# Patient Record
Sex: Female | Born: 1985
Health system: Southern US, Community
[De-identification: ages and names within clinical notes are randomized; demographics above are authoritative.]

## PROBLEM LIST (undated history)

## (undated) DIAGNOSIS — F988 Other specified behavioral and emotional disorders with onset usually occurring in childhood and adolescence: Secondary | ICD-10-CM

## (undated) DIAGNOSIS — F329 Major depressive disorder, single episode, unspecified: Secondary | ICD-10-CM

## (undated) DIAGNOSIS — R87629 Unspecified abnormal cytological findings in specimens from vagina: Secondary | ICD-10-CM

## (undated) DIAGNOSIS — N750 Cyst of Bartholin's gland: Secondary | ICD-10-CM

## (undated) DIAGNOSIS — F419 Anxiety disorder, unspecified: Secondary | ICD-10-CM

## (undated) DIAGNOSIS — F32A Depression, unspecified: Secondary | ICD-10-CM

## (undated) DIAGNOSIS — N879 Dysplasia of cervix uteri, unspecified: Secondary | ICD-10-CM

## (undated) HISTORY — DX: Depression, unspecified: F32.A

## (undated) HISTORY — DX: Major depressive disorder, single episode, unspecified: F32.9

## (undated) HISTORY — DX: Anxiety disorder, unspecified: F41.9

## (undated) HISTORY — DX: Cyst of Bartholin's gland: N75.0

## (undated) HISTORY — DX: Other specified behavioral and emotional disorders with onset usually occurring in childhood and adolescence: F98.8

## (undated) HISTORY — DX: Dysplasia of cervix uteri, unspecified: N87.9

---
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
## 2005-11-06 ENCOUNTER — Emergency Department (HOSPITAL_COMMUNITY): Admission: EM | Admit: 2005-11-06 | Discharge: 2005-11-06 | Payer: Self-pay | Admitting: Emergency Medicine

## 2006-03-25 ENCOUNTER — Inpatient Hospital Stay (HOSPITAL_COMMUNITY): Admission: AD | Admit: 2006-03-25 | Discharge: 2006-03-28 | Payer: Self-pay | Admitting: Obstetrics and Gynecology

## 2007-11-28 ENCOUNTER — Emergency Department (HOSPITAL_COMMUNITY): Admission: EM | Admit: 2007-11-28 | Discharge: 2007-11-28 | Payer: Self-pay | Admitting: Family Medicine

## 2009-03-12 ENCOUNTER — Emergency Department (HOSPITAL_COMMUNITY): Admission: EM | Admit: 2009-03-12 | Discharge: 2009-03-12 | Payer: Self-pay | Admitting: Emergency Medicine

## 2009-04-21 HISTORY — PX: CHOLECYSTECTOMY: SHX55

## 2009-06-07 ENCOUNTER — Ambulatory Visit (HOSPITAL_COMMUNITY): Admission: RE | Admit: 2009-06-07 | Discharge: 2009-06-07 | Payer: Self-pay | Admitting: Surgery

## 2010-04-21 HISTORY — PX: CERVICAL BIOPSY  W/ LOOP ELECTRODE EXCISION: SUR135

## 2010-07-10 LAB — CBC
HCT: 39.1 % (ref 36.0–46.0)
MCV: 90.4 fL (ref 78.0–100.0)
WBC: 7.6 10*3/uL (ref 4.0–10.5)

## 2010-07-10 LAB — DIFFERENTIAL
Basophils Absolute: 0 10*3/uL (ref 0.0–0.1)
Lymphs Abs: 2.6 10*3/uL (ref 0.7–4.0)
Monocytes Absolute: 0.4 10*3/uL (ref 0.1–1.0)
Monocytes Relative: 6 % (ref 3–12)
Neutro Abs: 4.5 10*3/uL (ref 1.7–7.7)

## 2010-07-10 LAB — COMPREHENSIVE METABOLIC PANEL
ALT: 16 U/L (ref 0–35)
AST: 19 U/L (ref 0–37)
Albumin: 4.2 g/dL (ref 3.5–5.2)
BUN: 13 mg/dL (ref 6–23)
CO2: 27 mEq/L (ref 19–32)
Calcium: 9.3 mg/dL (ref 8.4–10.5)
GFR calc Af Amer: >60 mL/min (ref >60)
GFR calc non Af Amer: >60 mL/min (ref >60)
Glucose, Bld: 96 mg/dL (ref 70–99)
Potassium: 4.1 mEq/L (ref 3.5–5.1)

## 2010-07-24 ENCOUNTER — Ambulatory Visit (INDEPENDENT_AMBULATORY_CARE_PROVIDER_SITE_OTHER): Payer: Medicaid Other | Admitting: Gynecology

## 2010-07-24 DIAGNOSIS — N871 Moderate cervical dysplasia: Secondary | ICD-10-CM

## 2010-07-24 DIAGNOSIS — R87810 Cervical high risk human papillomavirus (HPV) DNA test positive: Secondary | ICD-10-CM

## 2010-07-24 DIAGNOSIS — N750 Cyst of Bartholin's gland: Secondary | ICD-10-CM

## 2010-07-31 ENCOUNTER — Ambulatory Visit (INDEPENDENT_AMBULATORY_CARE_PROVIDER_SITE_OTHER): Payer: Medicaid Other | Admitting: Gynecology

## 2010-07-31 ENCOUNTER — Other Ambulatory Visit: Payer: Self-pay | Admitting: Gynecology

## 2010-07-31 DIAGNOSIS — N871 Moderate cervical dysplasia: Secondary | ICD-10-CM

## 2010-09-06 NOTE — Op Note (Signed)
NAME:  [name], [name] NO.:  [id number]   MEDICAL RECORD NO.:  [id number]          PATIENT TYPE:  INP   LOCATION:  9105                          FACILITY:  [facility]   PHYSICIAN:  Juluis Mire, M.D.   DATE OF BIRTH:  21-Mar-1986   DATE OF PROCEDURE:  03/25/2006  DATE OF DISCHARGE:                               OPERATIVE REPORT   PREOPERATIVE DIAGNOSIS:  Uterine pregnancy at term with prior cesarean  section, desires repeat.   POSTOPERATIVE DIAGNOSIS:  Uterine pregnancy at term with prior cesarean  section, desires repeat.   PROCEDURE:  Low transverse cesarean section.   SURGEON:  Juluis Mire, M.D.   ANESTHESIA:  Spinal.   ESTIMATED BLOOD LOSS:  400-500 mL.   PACKS AND DRAINS:  None.   INTRAOPERATIVE BLOOD PLACED:  None.   COMPLICATIONS:  None.   INDICATIONS:  Dictated history and physical.   DESCRIPTION OF PROCEDURE:  Patient was taken to the OR, placed in supine  position with left lateral tilt.  After satisfactory level of spinal  anesthesia was obtained, the abdomen was prepped out with Betadine and  draped as a sterile field.  A prior low transverse incision was  identified.  The prior low transverse incision was then excised.  The  incision was extended through the subcutaneous tissue.  The fascia was  entered sharply and incision in fascia extended laterally.  The fascia  taken off the muscle superiorly inferiorly.  Rectus muscles were  separated in the midline.  Peritoneum was entered sharply and incision  in perineum extended both superiorly and inferiorly.  A low transverse  bladder flap was developed.  A low transverse uterine incision begun  with a knife and extended laterally using manual retraction.  Amniotic  fluid was clear.  The infant was actually in the back down presentation,  we converted to a breech and it was delivered in the usual manner.  The  infant was a viable female, Apgars were 8/9.  PH and weight are pending.  Placenta was  then delivered manually.  Uterus was exteriorized for  closure.  Tubes and ovaries were unremarkable.  Uterus was then closed  with running locking suture of 0 chromic using two-layer closure  technique.  We had good hemostasis and clear urine output.  Uterus  returned to the abdominal cavity.  Muscles were reapproximated with  running suture of 3-0 Vicryl, fascia closed with running suture of 0  PDS.  Skin  was closed with staples and Steri-Strips.  Sponge, needle and instrument  counts were reported as correct by circulating nurse x2.  Foley catheter  remained clear at time of closure.  The patient tolerated suture and was  returned to the recovery room in good condition.      Juluis Mire, M.D.  Electronically Signed     JSM/MEDQ  D:  03/25/2006  T:  03/25/2006  Job:  [id number]

## 2010-09-06 NOTE — Op Note (Signed)
[name], [name]                 ACCOUNT NO.:  [id number]   MEDICAL RECORD NO.:  [id number]          PATIENT TYPE:  INP   LOCATION:  9198                          [facility]ACILITY:  [facility]   PHYSICIAN:  Dineen Kid. Rana Snare, M.D.    DATE OF BIRTH:  1985/06/01   DATE OF PROCEDURE:  04/01/2004  DATE OF DISCHARGE:                                 OPERATIVE REPORT   PREOPERATIVE DIAGNOSES:  1.  Intrauterine pregnancy at 40-1/2 weeks.  2.  Macrosomia.  3.  Borderline pelvis and unfavorable cervix.   POSTOPERATIVE DIAGNOSES:  1.  Intrauterine pregnancy at 40-1/2 weeks.  2.  Macrosomia.  3.  Borderline pelvis and unfavorable cervix.  4.  Left paratubal cyst.   PROCEDURE:  Primary low-segment transverse cesarean section and removal of  left paratubal cyst.   SURGEON:  Dineen Kid. Rana Snare, M.D.   ANESTHESIA:  Spinal.   INDICATIONS:  [name] is a 25 year old G1 at 40-1/2 weeks.  Ultrasound  shows 9 pounds, 12 ounces.  The cervix is unfavorable and the presenting  vertex is not in the pelvis.  Planned primary low-segment transverse  cesarean section for borderline pelvis and macrosomia and unfavorable  cervix.  Risks and benefits were discussed.  Informed consent was obtained.   FINDINGS AT THE TIME OF SURGERY:  A viable female infant, Apgars are 8 and 9.  pH  and weight are currently pending.  She did have two left paratubal  cysts.   DESCRIPTION OF PROCEDURE:  After adequate analgesia, the patient was placed  in the supine position, left lateral tilt.  She was sterilely prepped and  draped.  The bladder was sterilely drained with a Foley catheter.  A  Pfannenstiel skin incision was made two fingerbreadths above the pubic  symphysis, taken down sharply to the fascia which was incised transversely  and then extended superiorly, then inferiorly off the bellies of the rectus  muscle.  The rectus muscles were sharply separated and bladder flap was  created and placed behind the bladder blade.  A low segment   myotomy  incision was made down to the infant's vertex, extended laterally with the  operator's fingertips.  The infant's vertex was easily delivered with vacuum  extractor with one easy pull.  The nose and pharynx were suctioned.  Nuchal  cord x1 reduced.  The infant was then delivered, cord, clamped, cut and  handed to pediatricians for resuscitation.  The placenta was extracted  manually after cord blood was obtained.  The uterus was exteriorized, wiped  clean with a dry lap.  The myotomy incision was closed in two layers, the  first being a running locking layer, the second being an imbricating layer.  After hemostasis was achieved, the left paratubal cyst hanging off the end  of the fimbria was noted, sharply excised with Bovie cautery with good  hemostasis achieved and care taken to avoid the fimbriated portion of the  tube without much tubal destruction.  The uterus was placed back into the  peritoneal cavity and after a copious amount of irrigation, adequate  hemostasis was assured.  The peritoneum was closed with 0 Monocryl in a  running fashion.  The rectus muscle was plicated in the midline.  Irrigation  was applied and after adequate hemostasis, the fascia was closed with #1  Vicryl in running fashion.  Irrigation was applied after hemostasis.  Skin  was stapled, Steri-Strips were applied.  The patient tolerated the procedure  well, was stable and transferred to recovery room.  Sponge and instrument  count was normal x3.  Estimated blood loss was 800 mL.  The patient received  1 g of Rocephin after delivery of the placenta.     Davi   DCL/MEDQ  D:  04/01/2004  T:  04/01/2004  Job:  [id number]

## 2010-09-06 NOTE — H&P (Signed)
NAME:  [name], CALOCA NO.:  [id number]   MEDICAL RECORD NO.:  [id number]          PATIENT TYPE:  INP   LOCATION:  NA                            [facility]   PHYSICIAN:  Juluis Mire, M.D.   DATE OF BIRTH:  1985/11/23   DATE OF ADMISSION:  03/25/2006  DATE OF DISCHARGE:                              HISTORY & PHYSICAL   The patient is a 25 year old gravida 2, para 1 female, estimated date of  confinement of December 11 giving her an estimated gestational age of [redacted]  weeks.  She had a prior cesarean section for failure to progress.  Have  discussed trial of labor which she declined presenting for repeat  cesarean section.  No other risk factors noted.   No known drug allergies.   MEDICATIONS:  Include prenatal vitamins.   PRENATAL LABORATORY DATA:  She is A+ with a negative antibody screen,  nonreactive serology, is immune to rubella and her hepatitis surface  antigen was negative.  HIV was nonreactive.   For past medical history, family history and social history, please see  prenatal records.   REVIEW OF SYSTEMS:  Noncontributory.   PHYSICAL EXAMINATION:  The patient is afebrile with stable vital signs.  HEENT EXAM:  The patient is normocephalic.  Pupils equal, round, and  reactive to light and accommodation.  Extraocular movements were intact.  Sclerae and conjunctivae clear.  Oropharynx clear.  NECK:  Without thyromegaly.  BREAST:  Glandular but no discrete masses.  LUNGS:  Clear.  CARDIOVASCULAR SYSTEM:  Regular rhythm rate without murmurs or gallops.  Her abdominal exam confirms a gravid uterus consistent with dates.  Fetal heart tones are audible.  PELVIC:  Deferred due to planned cesarean section.  EXTREMITIES:  Trace edema.  NEUROLOGICAL EXAM:  Grossly normal within normal limits.  Deep tendon  reflexes were 2+, and there was no clonus.   IMPRESSION:  1. Intrauterine pregnancy at 39 weeks.  2. Prior cesarean section, desirous of  repeat.   PLAN:  The patient will undergo repeat cesarean section.  Risk of  cesarean section discussed including the risk of an infection.  The risk  of hemorrhage could require transfusion, risk of AIDS or hepatitis.  The  risk of injury to adjacent organs including bladder, bowel, or ureters  could require further exploratory surgery.  Risk of deep venous  thrombosis and pulmonary embolus.  The patient expressed understanding  of indications and risk.      Juluis Mire, M.D.  Electronically Signed     JSM/MEDQ  D:  03/25/2006  T:  03/25/2006  Job:  [id number]

## 2010-09-06 NOTE — Discharge Summary (Signed)
[name], LOZADA NO.:  [id number]   MEDICAL RECORD NO.:  [id number]          PATIENT TYPE:  INP   LOCATION:  9102                          [facility]   PHYSICIAN:  Freddy Finner, M.D.   DATE OF BIRTH:  1985-10-14   DATE OF ADMISSION:  04/01/2004  DATE OF DISCHARGE:  04/04/2004                                 DISCHARGE SUMMARY   ADMITTING DIAGNOSES:  1.  Intrauterine pregnancy at 40-and-a-half weeks estimated gestational age.  2.  Macrosomia.  3.  Borderline pelvis and unfavorable cervix.   DISCHARGE DIAGNOSES:  1.  Status post low transverse cesarean section.  2.  Viable female infant.   PROCEDURE:  Primary low transverse cesarean section.   REASON FOR ADMISSION:  Please see dictated H&P.   HOSPITAL COURSE:  The patient was an 25 year old primigravida that was  admitted to [facility] for a scheduled cesarean section at  40 and five-sevenths weeks estimated gestational age.  The patient had had  an ultrasound in the office with an estimated fetal weight of 9 pounds 12  ounces.  The patient had a very unfavorable presentation of a vertex and a  borderline pelvis.  Due to fetal macrosomia and borderline pelvis, decision  was made to proceed with a cesarean delivery.  On the morning of admission  the patient was taken to the operating room where spinal anesthesia was  administered without difficulty.  A low transverse incision was made with  the delivery of a viable female infant weighing 8 pounds 10 ounces with Apgars  of 8 at one minute and 9 at five minutes.  The patient tolerated the  procedure well and was taken to the recovery room in stable condition.  On  postoperative day #1, the patient was without complaint.  Vital signs were  stable.  Abdomen was soft with good return of bowel function.  Abdominal  dressing was noted to have a small amount of old drainage noted on bandage.  Fundus was firm and nontender.  Labs revealed  hemoglobin of 9.7; platelet  count 167,000; wbc count of 10.4.  On postoperative day #2, the patient was  without complaint.  Vital signs remained stable, she was afebrile.  Abdomen  was soft.  Fundus was firm and nontender.  Abdominal dressing had been  removed revealing an incision that was clean, dry, and intact.  The patient  was ambulating well and tolerating a regular diet without complaints of  nausea and vomiting.  On postoperative day #3, the patient was without  complaint.  Vital signs were stable.  Abdomen was soft, fundus was firm and  nontender. Incision was clean, dry, and intact.  Staples were removed and  the patient was discharged home.   CONDITION ON DISCHARGE:  Good.   DIET:  Regular as tolerated.   ACTIVITY:  No heavy lifting, no driving x2 weeks, no vaginal entry.   FOLLOW-UP:  The patient to follow up in the office in 1 week for an incision  check.  She is to call for temperature greater than 100 degrees,  persistent  nausea and vomiting, heavy vaginal bleeding, and/or redness or drainage from  the incisional site.   DISCHARGE MEDICATIONS:  1.  Tylox #30 one p.o. q.4-6h. p.r.n.  2.  Motrin 600 mg q.6h.  3.  Prenatal vitamins one p.o. daily.  4.  Colace one p.o. daily p.r.n.     Caro   CC/MEDQ  D:  04/26/2004  T:  04/26/2004  Job:  [id number]

## 2010-09-06 NOTE — H&P (Signed)
NAMEJAYLYNN, [name]                 ACCOUNT NO.:  [id number]   MEDICAL RECORD NO.:  [id number]          PATIENT TYPE:  INP   LOCATION:  NA                            [facility]   PHYSICIAN:  Dineen Kid. Rana Snare, M.D.    DATE OF BIRTH:  12/31/1985   DATE OF ADMISSION:  04/01/2004  DATE OF DISCHARGE:                                HISTORY & PHYSICAL   HISTORY OF PRESENT ILLNESS:  The patient is an 25 year old gravida 1, at 53-  5/[redacted] weeks gestational age with an emergency department of March 27, 2004.  She had an ultrasound on March 28, 2004, showing estimated fetal weight of  9 pounds 12 ounces.  She has a very unfavorable presentation of the fetal  vertex and a borderline pelvis.  She presents for primary low transverse  cesarean section due to fetal macrosomia and borderline pelvis.  Her  pregnancy has otherwise been uncomplicated other than abnormal Pap smear and  she is GBS negative.  She did have late prenatal care, presented at 25 weeks  and so first trimester screening was not performed.  Blood type is A  positive.   PAST MEDICAL HISTORY:  Negative.   PAST SURGICAL HISTORY:  Negative.   MEDICATIONS:  Prenatal vitamins.   ALLERGIES:  No known drug allergies.   PHYSICAL EXAMINATION:  VITAL SIGNS:  Blood pressure 100/80.  HEART:  Regular rate and rhythm.  LUNGS:  Clear to auscultation bilaterally.  ABDOMEN:  Gravida and nontender.  Fundal height is 40 cm.  PELVIC:  Cervix is 2, 50%, and fetal vertex very high out of the pelvis.   IMPRESSION:  Intrauterine pregnancy at 40-1/[redacted] weeks gestational age with  fetal macrosomia, unfavorable cervix and unfavorable presentation.   PLAN:  Primary low transverse cesarean section.  Risks and benefits were  discussed at length which include, but are not limited to risk of infection,  bleeding, damage to uterus, tubes, ovaries, bowel, bladder, and fetus.  She  does give her informed consent and wished to proceed.     Davi   DCL/MEDQ   D:  04/01/2004  T:  04/01/2004  Job:  [id number]

## 2010-09-06 NOTE — Discharge Summary (Signed)
NAMESHARYN, BRILLIANT NO.:  [id number]   MEDICAL RECORD NO.:  [id number]          PATIENT TYPE:  INP   LOCATION:  9105                          [facility]   PHYSICIAN:  Juluis Mire, M.D.   DATE OF BIRTH:  10-13-1985   DATE OF ADMISSION:  03/25/2006  DATE OF DISCHARGE:  03/28/2006                               DISCHARGE SUMMARY   ADMISSION DIAGNOSES:  1. Intrauterine pregnancy at term.  2. Previous cesarean section, desires repeat.   DISCHARGE DIAGNOSES:  1. Status post low transverse cesarean section.  2. A viable female infant.   PROCEDURE:  Repeat low transverse cesarean section.   REASON FOR ADMISSION:  Please see dictated H&P.   HOSPITAL COURSE:  The patient was a of 25 year old gravida 2, para 1  that was admitted to [facility] for scheduled cesarean  section.  The patient had had a previous cesarean section for failure to  progress.  The patient therefore desired a repeat cesarean section.  On  the morning of admission the patient was taken to the operating room  where spinal anesthesia was administered without difficulty.  A low  transverse incision was made with delivery of a viable female infant  weighing 7 pounds 12 ounces with Apgars of 9 at 1 minute and 9 at 5  minutes.  The patient tolerated the procedure well and was taken to the  recovery room in stable condition.   On postoperative day #1, the patient was without complaint.  Vital signs  were stable.  She was afebrile.  Abdomen soft.  Fundus firm and  nontender.  Abdominal dressing was noted to have a scant amount of old  drainage noted on the bandage.  Laboratory findings showed hemoglobin  9.3, platelet count of 122,000, wbc count of 8.1.   Postoperative day #2, the patient was without complaint.  Vital signs  were stable, she was afebrile.  Abdomen soft.  Fundus firm and  nontender.  Abdominal dressing had been removed revealing an incision  that is clean, dry and  intact.   On postoperative day #3, the patient was without complaint.  Vital signs  remained stable.  She is afebrile.  Fundus firm and nontender.  Incision  was clean, dry and intact.  Staples removed.  The patient was later  discharged home.   CONDITION ON DISCHARGE:  Stable.   DIET:  Regular as tolerated.   ACTIVITY:  No heavy lifting, no driving x2 weeks, no vaginal entry.   FOLLOW UP:  Patient to follow up in the office in 1 week for an incision  check.  She is to call for temperature greater than 100 degrees,  persistent nausea, vomiting, heavy vaginal bleeding and/or redness or  drainage from incisional site.   DISCHARGE MEDICATIONS:  1. Tylox #30 one p.o. q.4-6h. p.r.n.  2. Motrin 600 mg every 6 hours.  3. Prenatal vitamins one p.o. daily.  4. Colace one p.o. daily p.r.n.      Julio Sicks, N.P.      Juluis Mire, M.D.  Electronically Signed  CC/MEDQ  D:  04/28/2006  T:  04/28/2006  Job:  [id number]

## 2010-10-28 ENCOUNTER — Other Ambulatory Visit: Payer: Self-pay | Admitting: Gynecology

## 2010-10-28 ENCOUNTER — Other Ambulatory Visit (HOSPITAL_COMMUNITY)
Admission: RE | Admit: 2010-10-28 | Discharge: 2010-10-28 | Disposition: A | Discharge status: Home / Self Care | Payer: Medicaid Other | Source: Ambulatory Visit | Attending: Gynecology | Admitting: Gynecology

## 2010-10-28 ENCOUNTER — Ambulatory Visit (INDEPENDENT_AMBULATORY_CARE_PROVIDER_SITE_OTHER): Payer: Medicaid Other | Admitting: Gynecology

## 2010-10-28 DIAGNOSIS — R87619 Unspecified abnormal cytological findings in specimens from cervix uteri: Secondary | ICD-10-CM | POA: Insufficient documentation

## 2010-10-28 DIAGNOSIS — N750 Cyst of Bartholin's gland: Secondary | ICD-10-CM

## 2010-10-28 DIAGNOSIS — N841 Polyp of cervix uteri: Secondary | ICD-10-CM

## 2010-12-29 ENCOUNTER — Emergency Department (HOSPITAL_COMMUNITY)
Admission: EM | Admit: 2010-12-29 | Discharge: 2010-12-29 | Disposition: A | Discharge status: Home / Self Care | Payer: Medicaid Other | Attending: Emergency Medicine | Admitting: Emergency Medicine

## 2010-12-29 ENCOUNTER — Emergency Department (HOSPITAL_COMMUNITY): Payer: Medicaid Other

## 2010-12-29 DIAGNOSIS — M79609 Pain in unspecified limb: Secondary | ICD-10-CM | POA: Insufficient documentation

## 2010-12-29 DIAGNOSIS — Z8781 Personal history of (healed) traumatic fracture: Secondary | ICD-10-CM | POA: Insufficient documentation

## 2011-05-21 ENCOUNTER — Emergency Department (HOSPITAL_COMMUNITY): Payer: No Typology Code available for payment source

## 2011-05-21 ENCOUNTER — Encounter (HOSPITAL_COMMUNITY): Payer: Self-pay | Admitting: *Deleted

## 2011-05-21 ENCOUNTER — Emergency Department (HOSPITAL_COMMUNITY)
Admission: EM | Admit: 2011-05-21 | Discharge: 2011-05-21 | Disposition: A | Discharge status: Home / Self Care | Payer: No Typology Code available for payment source | Attending: Emergency Medicine | Admitting: Emergency Medicine

## 2011-05-21 DIAGNOSIS — M25519 Pain in unspecified shoulder: Secondary | ICD-10-CM | POA: Insufficient documentation

## 2011-05-21 DIAGNOSIS — M542 Cervicalgia: Secondary | ICD-10-CM | POA: Insufficient documentation

## 2011-05-21 DIAGNOSIS — R079 Chest pain, unspecified: Secondary | ICD-10-CM | POA: Insufficient documentation

## 2011-05-21 DIAGNOSIS — S42001A Fracture of unspecified part of right clavicle, initial encounter for closed fracture: Secondary | ICD-10-CM

## 2011-05-21 DIAGNOSIS — S42023A Displaced fracture of shaft of unspecified clavicle, initial encounter for closed fracture: Secondary | ICD-10-CM | POA: Insufficient documentation

## 2011-05-21 MED ORDER — MORPHINE SULFATE 4 MG/ML IJ SOLN
4.0000 mg | Freq: Once | INTRAMUSCULAR | Status: AC
  Administered 2011-05-21: 4 mg via INTRAVENOUS
  Filled 2011-05-21: qty 1

## 2011-05-21 MED ORDER — HYDROCODONE-ACETAMINOPHEN 5-500 MG PO TABS: 1.0000 | ORAL_TABLET | ORAL | Status: AC | PRN
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
## 2011-05-21 NOTE — ED Notes (Addendum)
Hourly rounding: Pt complaining of pain at this time. Pain is 10 out of 10. Discussed plan of care. Environment secured. Call light within reach. Bed locked. Will continue to monitor.
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
## 2011-05-21 NOTE — ED Notes (Signed)
Dr. Patria Mane at bedside discussing plan of care with pt.

## 2011-05-21 NOTE — ED Notes (Signed)
Introduced self to pt. Plan of care discussed. Pt reports that she has drank "a lot" of ETOH tonight.
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
## 2011-05-21 NOTE — ED Notes (Signed)
Family at beside. Family given emotional support. Introduced self to family.

## 2011-06-05 ENCOUNTER — Other Ambulatory Visit (HOSPITAL_COMMUNITY): Payer: Self-pay | Admitting: Orthopedic Surgery

## 2011-06-09 ENCOUNTER — Encounter (HOSPITAL_COMMUNITY): Payer: Self-pay | Admitting: Pharmacy Technician

## 2011-06-09 ENCOUNTER — Encounter (HOSPITAL_COMMUNITY): Payer: Self-pay | Admitting: *Deleted

## 2011-06-09 MED ORDER — CEFAZOLIN SODIUM-DEXTROSE 2-3 GM-% IV SOLR
2.0000 g | INTRAVENOUS | Status: DC
  Filled 2011-06-09: qty 50

## 2011-06-09 MED ORDER — CHLORHEXIDINE GLUCONATE 4 % EX LIQD
60.0000 mL | Freq: Once | CUTANEOUS | Status: DC
  Filled 2011-06-09: qty 60

## 2011-06-10 ENCOUNTER — Ambulatory Visit (HOSPITAL_COMMUNITY): Payer: Medicaid Other

## 2011-06-10 ENCOUNTER — Other Ambulatory Visit: Payer: Self-pay

## 2011-06-10 ENCOUNTER — Encounter (HOSPITAL_COMMUNITY): Payer: Self-pay | Admitting: Certified Registered"

## 2011-06-10 ENCOUNTER — Inpatient Hospital Stay (HOSPITAL_COMMUNITY): Payer: Medicaid Other

## 2011-06-10 ENCOUNTER — Encounter (HOSPITAL_COMMUNITY)
Admission: RE | Disposition: A | Discharge status: Home / Self Care | Payer: Self-pay | Source: Ambulatory Visit | Attending: Orthopedic Surgery

## 2011-06-10 ENCOUNTER — Ambulatory Visit (HOSPITAL_COMMUNITY): Payer: Medicaid Other | Admitting: Certified Registered"

## 2011-06-10 ENCOUNTER — Ambulatory Visit (HOSPITAL_COMMUNITY)
Admission: RE | Admit: 2011-06-10 | Discharge: 2011-06-11 | DRG: 517 | Disposition: A | Discharge status: Home / Self Care | Payer: Medicaid Other | Source: Ambulatory Visit | Attending: Orthopedic Surgery | Admitting: Orthopedic Surgery

## 2011-06-10 ENCOUNTER — Encounter (HOSPITAL_COMMUNITY): Payer: Self-pay | Admitting: Radiology

## 2011-06-10 DIAGNOSIS — S42023A Displaced fracture of shaft of unspecified clavicle, initial encounter for closed fracture: Secondary | ICD-10-CM | POA: Insufficient documentation

## 2011-06-10 DIAGNOSIS — S42009A Fracture of unspecified part of unspecified clavicle, initial encounter for closed fracture: Secondary | ICD-10-CM

## 2011-06-10 HISTORY — PX: ORIF CLAVICULAR FRACTURE: SHX5055

## 2011-06-10 LAB — CBC
HCT: 39.3 % (ref 36.0–46.0)
MCV: 89.1 fL (ref 78.0–100.0)
RBC: 4.41 MIL/uL (ref 3.87–5.11)
WBC: 7 10*3/uL (ref 4.0–10.5)

## 2011-06-10 LAB — HCG, SERUM, QUALITATIVE: Preg, Serum: NEGATIVE

## 2011-06-10 LAB — TYPE AND SCREEN

## 2011-06-10 LAB — BASIC METABOLIC PANEL
CO2: 26 mEq/L (ref 19–32)
Chloride: 103 mEq/L (ref 96–112)
Creatinine, Ser: 0.6 mg/dL (ref 0.50–1.10)
Sodium: 140 mEq/L (ref 135–145)

## 2011-06-10 LAB — SURGICAL PCR SCREEN: Staphylococcus aureus: NEGATIVE

## 2011-06-10 LAB — ABO/RH: ABO/RH(D): A POS

## 2011-06-10 SURGERY — OPEN REDUCTION INTERNAL FIXATION (ORIF) CLAVICULAR FRACTURE
Anesthesia: General | Site: Shoulder | Laterality: Right | Wound class: Clean

## 2011-06-10 MED ORDER — ONDANSETRON HCL 4 MG/2ML IJ SOLN: 4.0000 mg | Freq: Four times a day (QID) | INTRAMUSCULAR | Status: DC | PRN

## 2011-06-10 MED ORDER — KETOROLAC TROMETHAMINE 30 MG/ML IJ SOLN
INTRAMUSCULAR | Status: DC | PRN
  Administered 2011-06-10: 30 mg via INTRAVENOUS

## 2011-06-10 MED ORDER — FENTANYL CITRATE 0.05 MG/ML IJ SOLN
INTRAMUSCULAR | Status: DC | PRN
  Administered 2011-06-10: 50 ug via INTRAVENOUS
  Administered 2011-06-10: 100 ug via INTRAVENOUS
  Administered 2011-06-10 (×3): 50 ug via INTRAVENOUS

## 2011-06-10 MED ORDER — CEFAZOLIN SODIUM 1-5 GM-% IV SOLN
1.0000 g | Freq: Three times a day (TID) | INTRAVENOUS | Status: DC
  Administered 2011-06-10 – 2011-06-11 (×2): 1 g via INTRAVENOUS
  Filled 2011-06-10 (×3): qty 50

## 2011-06-10 MED ORDER — LACTATED RINGERS IV SOLN
INTRAVENOUS | Status: DC
  Administered 2011-06-10: 15:00:00 via INTRAVENOUS

## 2011-06-10 MED ORDER — NEOSTIGMINE METHYLSULFATE 1 MG/ML IJ SOLN
INTRAMUSCULAR | Status: DC | PRN
  Administered 2011-06-10: 4 mg via INTRAVENOUS

## 2011-06-10 MED ORDER — METHOCARBAMOL 100 MG/ML IJ SOLN
500.0000 mg | INTRAVENOUS | Status: AC
  Administered 2011-06-10: 500 mg via INTRAVENOUS
  Filled 2011-06-10: qty 5

## 2011-06-10 MED ORDER — METOCLOPRAMIDE HCL 10 MG PO TABS: 5.0000 mg | ORAL_TABLET | Freq: Three times a day (TID) | ORAL | Status: DC | PRN

## 2011-06-10 MED ORDER — HYDROMORPHONE HCL PF 1 MG/ML IJ SOLN
INTRAMUSCULAR | Status: AC
  Filled 2011-06-10: qty 1

## 2011-06-10 MED ORDER — HYDROMORPHONE HCL PF 1 MG/ML IJ SOLN
0.2500 mg | INTRAMUSCULAR | Status: DC | PRN
  Administered 2011-06-10 (×3): 0.5 mg via INTRAVENOUS

## 2011-06-10 MED ORDER — METHOCARBAMOL 500 MG PO TABS
500.0000 mg | ORAL_TABLET | Freq: Four times a day (QID) | ORAL | Status: DC | PRN
  Administered 2011-06-11 (×2): 500 mg via ORAL
  Filled 2011-06-10 (×2): qty 1

## 2011-06-10 MED ORDER — KETOROLAC TROMETHAMINE 30 MG/ML IJ SOLN
30.0000 mg | Freq: Four times a day (QID) | INTRAMUSCULAR | Status: DC
  Administered 2011-06-10 – 2011-06-11 (×2): 30 mg via INTRAVENOUS
  Filled 2011-06-10 (×6): qty 1

## 2011-06-10 MED ORDER — MEPERIDINE HCL 25 MG/ML IJ SOLN
6.2500 mg | INTRAMUSCULAR | Status: DC | PRN
  Administered 2011-06-10: 12.5 mg via INTRAVENOUS

## 2011-06-10 MED ORDER — MENTHOL 3 MG MT LOZG: 1.0000 | LOZENGE | OROMUCOSAL | Status: DC | PRN

## 2011-06-10 MED ORDER — ONDANSETRON HCL 4 MG PO TABS: 4.0000 mg | ORAL_TABLET | Freq: Four times a day (QID) | ORAL | Status: DC | PRN

## 2011-06-10 MED ORDER — ACETAMINOPHEN 325 MG PO TABS: 650.0000 mg | ORAL_TABLET | Freq: Four times a day (QID) | ORAL | Status: DC | PRN

## 2011-06-10 MED ORDER — BUPIVACAINE HCL (PF) 0.25 % IJ SOLN
INTRAMUSCULAR | Status: DC | PRN
  Administered 2011-06-10: 10 mL

## 2011-06-10 MED ORDER — PROPOFOL 10 MG/ML IV EMUL
INTRAVENOUS | Status: DC | PRN
  Administered 2011-06-10: 200 mg via INTRAVENOUS

## 2011-06-10 MED ORDER — ROCURONIUM BROMIDE 100 MG/10ML IV SOLN
INTRAVENOUS | Status: DC | PRN
  Administered 2011-06-10: 50 mg via INTRAVENOUS
  Administered 2011-06-10: 10 mg via INTRAVENOUS

## 2011-06-10 MED ORDER — OXYCODONE HCL 5 MG PO TABS
5.0000 mg | ORAL_TABLET | ORAL | Status: DC | PRN
  Administered 2011-06-11 (×3): 10 mg via ORAL
  Filled 2011-06-10 (×3): qty 2

## 2011-06-10 MED ORDER — ONDANSETRON HCL 4 MG/2ML IJ SOLN
INTRAMUSCULAR | Status: DC | PRN
  Administered 2011-06-10 (×2): 4 mg via INTRAVENOUS

## 2011-06-10 MED ORDER — MUPIROCIN 2 % EX OINT
TOPICAL_OINTMENT | Freq: Two times a day (BID) | CUTANEOUS | Status: DC
  Administered 2011-06-10: 1 via NASAL
  Filled 2011-06-10: qty 22

## 2011-06-10 MED ORDER — METHOCARBAMOL 100 MG/ML IJ SOLN
500.0000 mg | Freq: Four times a day (QID) | INTRAVENOUS | Status: DC | PRN
  Filled 2011-06-10: qty 5

## 2011-06-10 MED ORDER — HYDROMORPHONE HCL PF 1 MG/ML IJ SOLN
0.5000 mg | INTRAMUSCULAR | Status: DC | PRN
  Administered 2011-06-10 – 2011-06-11 (×3): 1 mg via INTRAVENOUS
  Filled 2011-06-10 (×3): qty 1

## 2011-06-10 MED ORDER — MIDAZOLAM HCL 5 MG/5ML IJ SOLN
INTRAMUSCULAR | Status: DC | PRN
  Administered 2011-06-10: 2 mg via INTRAVENOUS

## 2011-06-10 MED ORDER — POTASSIUM CHLORIDE IN NACL 20-0.9 MEQ/L-% IV SOLN
INTRAVENOUS | Status: DC
  Administered 2011-06-10: 23:00:00 via INTRAVENOUS
  Filled 2011-06-10 (×2): qty 1000

## 2011-06-10 MED ORDER — LACTATED RINGERS IV SOLN
INTRAVENOUS | Status: DC | PRN
  Administered 2011-06-10 (×2): via INTRAVENOUS

## 2011-06-10 MED ORDER — 0.9 % SODIUM CHLORIDE (POUR BTL) OPTIME
TOPICAL | Status: DC | PRN
  Administered 2011-06-10: 1000 mL

## 2011-06-10 MED ORDER — MORPHINE SULFATE 4 MG/ML IJ SOLN: 0.0500 mg/kg | INTRAMUSCULAR | Status: DC | PRN

## 2011-06-10 MED ORDER — PHENOL 1.4 % MT LIQD
1.0000 | OROMUCOSAL | Status: DC | PRN
  Filled 2011-06-10: qty 177

## 2011-06-10 MED ORDER — LIDOCAINE HCL 4 % MT SOLN
OROMUCOSAL | Status: DC | PRN
  Administered 2011-06-10: 4 mL via TOPICAL

## 2011-06-10 MED ORDER — GLYCOPYRROLATE 0.2 MG/ML IJ SOLN
INTRAMUSCULAR | Status: DC | PRN
  Administered 2011-06-10: .6 mg via INTRAVENOUS

## 2011-06-10 MED ORDER — PHENYLEPHRINE HCL 10 MG/ML IJ SOLN
INTRAMUSCULAR | Status: DC | PRN
  Administered 2011-06-10 (×3): 80 ug via INTRAVENOUS

## 2011-06-10 MED ORDER — METOCLOPRAMIDE HCL 5 MG/ML IJ SOLN
5.0000 mg | Freq: Three times a day (TID) | INTRAMUSCULAR | Status: DC | PRN
  Filled 2011-06-10: qty 2

## 2011-06-10 MED ORDER — ONDANSETRON HCL 4 MG/2ML IJ SOLN: 4.0000 mg | Freq: Once | INTRAMUSCULAR | Status: DC | PRN

## 2011-06-10 MED ORDER — CEFAZOLIN SODIUM 1-5 GM-% IV SOLN
INTRAVENOUS | Status: DC | PRN
  Administered 2011-06-10: 2 g via INTRAVENOUS

## 2011-06-10 MED ORDER — MUPIROCIN 2 % EX OINT
TOPICAL_OINTMENT | CUTANEOUS | Status: AC
  Administered 2011-06-10: 1 via NASAL
  Filled 2011-06-10: qty 22

## 2011-06-10 MED ORDER — DEXAMETHASONE SODIUM PHOSPHATE 4 MG/ML IJ SOLN
INTRAMUSCULAR | Status: DC | PRN
  Administered 2011-06-10: 4 mg via INTRAVENOUS

## 2011-06-10 MED ORDER — ACETAMINOPHEN 650 MG RE SUPP: 650.0000 mg | Freq: Four times a day (QID) | RECTAL | Status: DC | PRN

## 2011-06-10 SURGICAL SUPPLY — 58 items
APL SKNCLS STERI-STRIP NONHPOA (GAUZE/BANDAGES/DRESSINGS) ×1
BENZOIN TINCTURE PRP APPL 2/3 (GAUZE/BANDAGES/DRESSINGS) ×2 IMPLANT
CLOTH BEACON ORANGE TIMEOUT ST (SAFETY) ×2 IMPLANT
COVER SURGICAL LIGHT HANDLE (MISCELLANEOUS) ×2 IMPLANT
DRAIN PENROSE 1/2X12 LTX STRL (WOUND CARE) IMPLANT
DRAPE C-ARM 42X72 X-RAY (DRAPES) ×2 IMPLANT
DRAPE INCISE IOBAN 66X45 STRL (DRAPES) ×4 IMPLANT
DRAPE U-SHAPE 47X51 STRL (DRAPES) ×4 IMPLANT
DRILL BIT 2.8MM (BIT) ×2 IMPLANT
DRSG MEPILEX BORDER 4X12 (GAUZE/BANDAGES/DRESSINGS) IMPLANT
DRSG MEPILEX BORDER 4X8 (GAUZE/BANDAGES/DRESSINGS) IMPLANT
DRSG PAD ABDOMINAL 8X10 ST (GAUZE/BANDAGES/DRESSINGS) IMPLANT
DURAPREP 26ML APPLICATOR (WOUND CARE) ×2 IMPLANT
ELECT REM PT RETURN 9FT ADLT (ELECTROSURGICAL) ×2
ELECTRODE REM PT RTRN 9FT ADLT (ELECTROSURGICAL) ×1 IMPLANT
FACESHIELD LNG OPTICON STERILE (SAFETY) IMPLANT
GAUZE XEROFORM 5X9 LF (GAUZE/BANDAGES/DRESSINGS) ×2 IMPLANT
GLOVE BIO SURGEON ST LM GN SZ9 (GLOVE) IMPLANT
GLOVE BIO SURGEON STRL SZ 6.5 (GLOVE) ×2 IMPLANT
GLOVE BIOGEL PI IND STRL 6.5 (GLOVE) ×1 IMPLANT
GLOVE BIOGEL PI IND STRL 7.5 (GLOVE) ×1 IMPLANT
GLOVE BIOGEL PI IND STRL 8 (GLOVE) ×1 IMPLANT
GLOVE BIOGEL PI INDICATOR 6.5 (GLOVE) ×1
GLOVE BIOGEL PI INDICATOR 7.5 (GLOVE) ×1
GLOVE BIOGEL PI INDICATOR 8 (GLOVE) ×1
GLOVE ECLIPSE 7.0 STRL STRAW (GLOVE) ×2 IMPLANT
GLOVE EXAM NITRILE XL STR (GLOVE) ×2 IMPLANT
GLOVE SURG ORTHO 8.0 STRL STRW (GLOVE) ×2 IMPLANT
GOWN PREVENTION PLUS LG XLONG (DISPOSABLE) ×2 IMPLANT
GOWN PREVENTION PLUS XLARGE (GOWN DISPOSABLE) ×2 IMPLANT
GOWN STRL NON-REIN LRG LVL3 (GOWN DISPOSABLE) ×2 IMPLANT
KIT BASIN OR (CUSTOM PROCEDURE TRAY) ×2 IMPLANT
KIT ROOM TURNOVER OR (KITS) ×2 IMPLANT
MANIFOLD NEPTUNE II (INSTRUMENTS) ×2 IMPLANT
NEEDLE 21X1 OR PACK (NEEDLE) IMPLANT
NEEDLE HYPO 25GX1X1/2 BEV (NEEDLE) ×2 IMPLANT
NS IRRIG 1000ML POUR BTL (IV SOLUTION) ×2 IMPLANT
PACK SHOULDER (CUSTOM PROCEDURE TRAY) ×2 IMPLANT
PAD ARMBOARD 7.5X6 YLW CONV (MISCELLANEOUS) ×4 IMPLANT
PENCIL BUTTON HOLSTER BLD 10FT (ELECTRODE) IMPLANT
PIN CLAVICLE ASSEMBLY (Pin) ×2 IMPLANT
PUTTY ORTHOBLAST II 5CC (Orthopedic Implant) ×2 IMPLANT
SLING ARM IMMOBILIZER LRG (SOFTGOODS) IMPLANT
SLING ARM IMMOBILIZER MED (SOFTGOODS) ×2 IMPLANT
SPONGE GAUZE 4X4 12PLY (GAUZE/BANDAGES/DRESSINGS) ×2 IMPLANT
SPONGE LAP 4X18 X RAY DECT (DISPOSABLE) ×2 IMPLANT
STAPLER VISISTAT 35W (STAPLE) IMPLANT
STRIP CLOSURE SKIN 1/2X4 (GAUZE/BANDAGES/DRESSINGS) ×2 IMPLANT
SUCTION FRAZIER TIP 10 FR DISP (SUCTIONS) ×2 IMPLANT
SUT PROLENE 3 0 PS 2 (SUTURE) ×2 IMPLANT
SUT VIC AB 2-0 CTB1 (SUTURE) ×2 IMPLANT
SUT VIC AB 3-0 FS2 27 (SUTURE) ×2 IMPLANT
SYR CONTROL 10ML LL (SYRINGE) ×2 IMPLANT
TOWEL OR 17X24 6PK STRL BLUE (TOWEL DISPOSABLE) ×2 IMPLANT
TOWEL OR 17X26 10 PK STRL BLUE (TOWEL DISPOSABLE) ×2 IMPLANT
TUBE CONNECTING 12X1/4 (SUCTIONS) ×2 IMPLANT
WATER STERILE IRR 1000ML POUR (IV SOLUTION) IMPLANT
YANKAUER SUCT BULB TIP NO VENT (SUCTIONS) ×2 IMPLANT

## 2011-06-10 NOTE — Anesthesia Preprocedure Evaluation (Addendum)
Anesthesia Evaluation  Patient identified by MRN, date of birth, ID band Patient awake    Reviewed: Allergy & Precautions, H&P , NPO status , Patient's Chart, lab work & pertinent test results  Airway Mallampati: I TM Distance: >3 FB Neck ROM: Full    Dental  (+) Dental Advisory Given and Teeth Intact   Pulmonary  clear to auscultation        Cardiovascular     Neuro/Psych    GI/Hepatic   Endo/Other    Renal/GU      Musculoskeletal   Abdominal   Peds  Hematology   Anesthesia Other Findings   Reproductive/Obstetrics                          Anesthesia Physical Anesthesia Plan  ASA: I  Anesthesia Plan: General   Post-op Pain Management:    Induction: Intravenous  Airway Management Planned: Oral ETT  Additional Equipment:   Intra-op Plan:   Post-operative Plan: Extubation in OR  Informed Consent:   Dental advisory given  Plan Discussed with: CRNA, Anesthesiologist and Surgeon  Anesthesia Plan Comments:         Anesthesia Quick Evaluation

## 2011-06-10 NOTE — Anesthesia Procedure Notes (Signed)
Procedure Name: Intubation Date/Time: 06/10/2011 3:48 PM Performed by: Glendora Score Pre-anesthesia Checklist: Patient identified, Emergency Drugs available, Suction available and Patient being monitored Patient Re-evaluated:Patient Re-evaluated prior to inductionOxygen Delivery Method: Circle System Utilized Preoxygenation: Pre-oxygenation with 100% oxygen Intubation Type: IV induction Ventilation: Mask ventilation without difficulty Laryngoscope Size: Miller and 2 Grade View: Grade I Tube type: Oral Tube size: 7.5 mm Number of attempts: 1 Airway Equipment and Method: stylet and LTA Placement Confirmation: ETT inserted through vocal cords under direct vision,  positive ETCO2 and breath sounds checked- equal and bilateral Secured at: 21 cm Tube secured with: Tape Dental Injury: Teeth and Oropharynx as per pre-operative assessment
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
## 2011-06-10 NOTE — Brief Op Note (Signed)
06/10/2011  5:59 PM  PATIENT:  [name]  26 y.o. female  PRE-OPERATIVE DIAGNOSIS:  Right midshaft clavicle fracture  POST-OPERATIVE DIAGNOSIS:  Right midshaft clavicle fracture  PROCEDURE:  Procedure(s): OPEN REDUCTION INTERNAL FIXATION (ORIF) CLAVICULAR FRACTURE  SURGEON:  Surgeon(s): Cammy Copa, MD  ASSISTANT: Jodene Nam PA  ANESTHESIA:   general  EBL: 25 ml    Total I/O In: 1000 [I.V.:1000] Out: -   BLOOD ADMINISTERED: none  DRAINS: none   LOCAL MEDICATIONS USED:  none  SPECIMEN:  No Specimen  COUNTS:  YES  TOURNIQUET:  * No tourniquets in log *  DICTATION: .Other Dictation: Dictation Number [id number]  PLAN OF CARE: Admit for overnight observation  PATIENT DISPOSITION:  PACU - hemodynamically stable

## 2011-06-10 NOTE — Transfer of Care (Signed)
Immediate Anesthesia Transfer of Care Note  Patient: [name]  Procedure(s) Performed: Procedure(s) (LRB): OPEN REDUCTION INTERNAL FIXATION (ORIF) CLAVICULAR FRACTURE (Right)  Patient Location: PACU  Anesthesia Type: General  Level of Consciousness: awake, alert , oriented and patient cooperative  Airway & Oxygen Therapy: Patient Spontanous Breathing and Patient connected to face mask oxygen  Post-op Assessment: Report given to PACU RN  Post vital signs: Reviewed and stable  Complications: No apparent anesthesia complications

## 2011-06-10 NOTE — Preoperative (Signed)
Beta Blockers   Reason not to administer Beta Blockers:Not Applicable 

## 2011-06-10 NOTE — Anesthesia Postprocedure Evaluation (Signed)
Anesthesia Post Note  Patient: [name]  Procedure(s) Performed: Procedure(s) (LRB): OPEN REDUCTION INTERNAL FIXATION (ORIF) CLAVICULAR FRACTURE (Right)  Anesthesia type: general  Patient location: PACU  Post pain: Pain level controlled  Post assessment: Patient's Cardiovascular Status Stable  Last Vitals:  Filed Vitals:   06/10/11 1955  BP: 116/86  Pulse: 82  Temp:   Resp: 12    Post vital signs: Reviewed and stable  Level of consciousness: sedated  Complications: No apparent anesthesia complications

## 2011-06-11 MED ORDER — METHOCARBAMOL 500 MG PO TABS: 500.0000 mg | ORAL_TABLET | Freq: Four times a day (QID) | ORAL | Status: AC | PRN

## 2011-06-11 MED ORDER — OXYCODONE HCL 5 MG PO TABS: 5.0000 mg | ORAL_TABLET | ORAL | Status: AC | PRN
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
## 2011-06-11 NOTE — Progress Notes (Signed)
Subjective: Pt stable pain controlled   Objective: Vital signs in last 24 hours: Temp:  [98.1 F (36.7 C)-98.9 F (37.2 C)] 98.1 F (36.7 C) (02/20 0516) Pulse Rate:  [68-90] 76  (02/20 0516) Resp:  [10-21] 20  (02/20 0516) BP: (104-127)/(70-89) 107/71 mmHg (02/20 0516) SpO2:  [97 %-100 %] 97 % (02/20 0516)  Intake/Output from previous day: 02/19 0701 - 02/20 0700 In: 3405 [P.O.:480; I.V.:2925] Out: -  Intake/Output this shift:    Exam:  Neurovascular intact Intact pulses distally  Labs:  Basename 06/10/11 1347  HGB 13.2    Basename 06/10/11 1347  WBC 7.0  RBC 4.41  HCT 39.3  PLT 284    Basename 06/10/11 1347  NA 140  K 4.0  CL 103  CO2 26  BUN 8  CREATININE 0.60  GLUCOSE 95  CALCIUM 9.9   No results found for this basename: LABPT:2,INR:2 in the last 72 hours  Assessment/Plan: xrays ok - dc today in sling   Remmie Bembenek SCOTT 06/11/2011, 7:37 AM
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
## 2011-06-16 ENCOUNTER — Encounter (HOSPITAL_COMMUNITY): Payer: Self-pay | Admitting: Orthopedic Surgery

## 2011-09-04 ENCOUNTER — Ambulatory Visit
Admission: RE | Admit: 2011-09-04 | Discharge: 2011-09-04 | Disposition: A | Discharge status: Home / Self Care | Payer: Medicaid Other | Source: Ambulatory Visit | Attending: Orthopedic Surgery | Admitting: Orthopedic Surgery

## 2011-09-04 ENCOUNTER — Other Ambulatory Visit: Payer: Self-pay | Admitting: Orthopedic Surgery

## 2011-09-04 DIAGNOSIS — S42001A Fracture of unspecified part of right clavicle, initial encounter for closed fracture: Secondary | ICD-10-CM

## 2011-11-19 IMAGING — CR DG HAND COMPLETE 3+V*R*
3 series · 3 of 3 positions shown · non-contrast
Comparison: None

CLINICAL DATA: Pain, punched a wall, right hand injury

RIGHT HAND - COMPLETE 3+ VIEW

[x hand pa right]
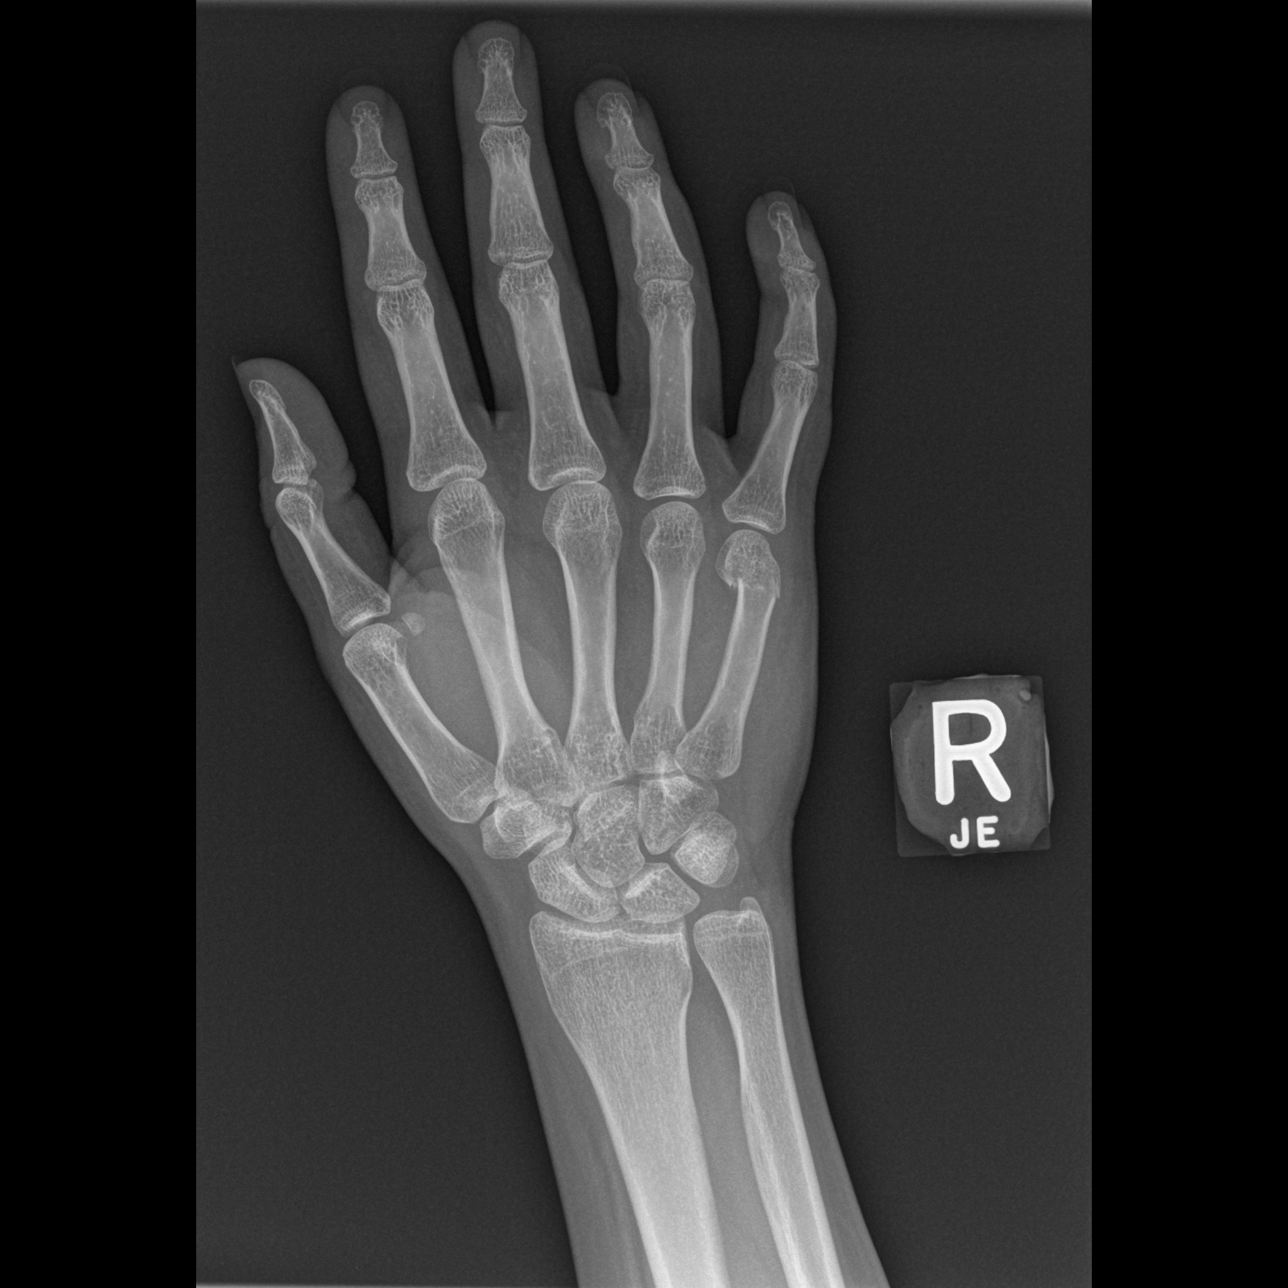

[x hand oblique right]
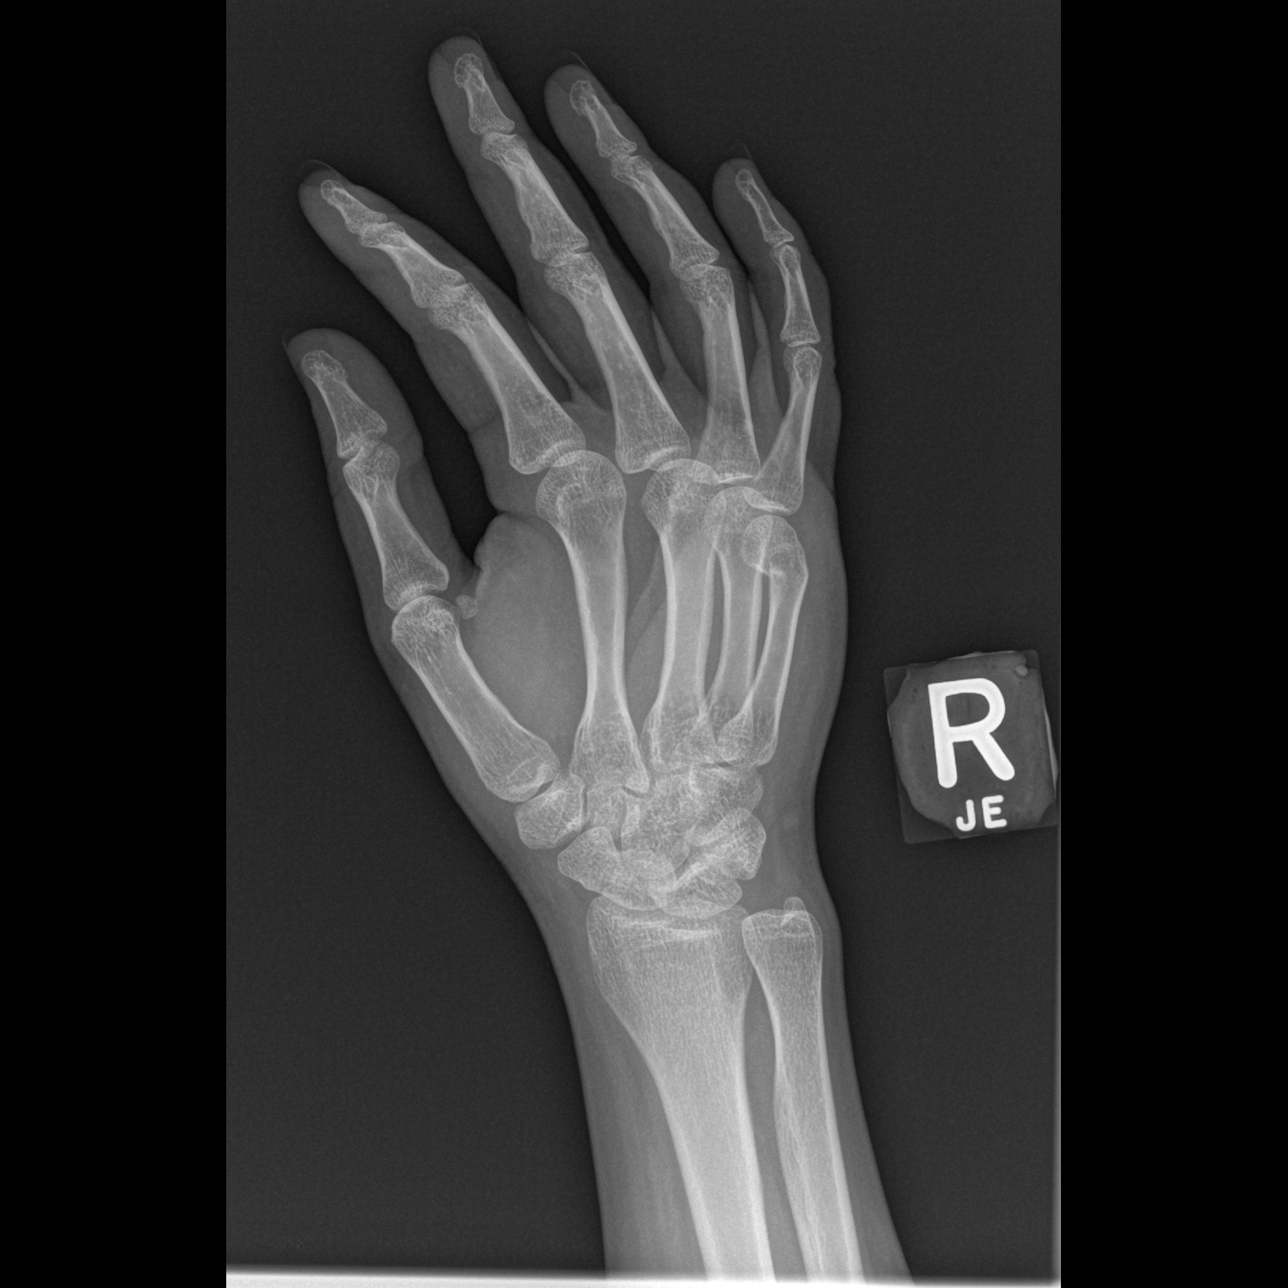

[x hand lat right]
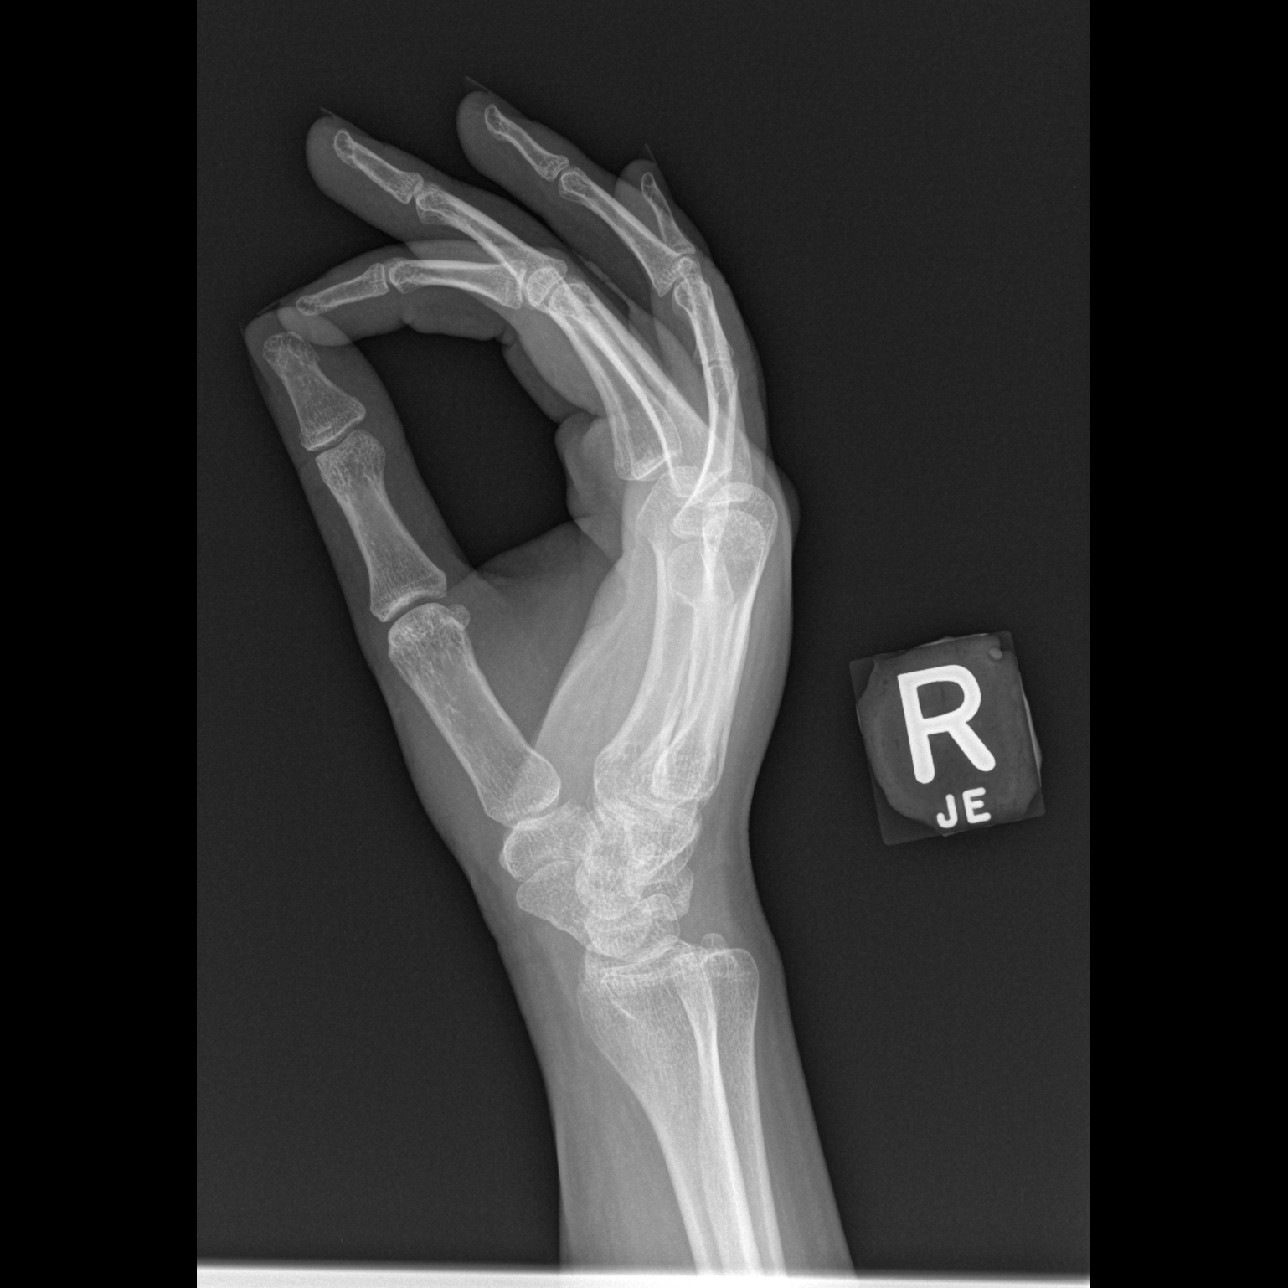

[3 of 3 positions shown; findings below may reference images not displayed]

FINDINGS: Transverse fracture distal right fifth metacarpal with apex ulnar
and dorsal angulation.
No intra articular extension.
No additional fracture, dislocation, or bone destruction.
Joint spaces preserved.
IMPRESSION: Mildly angulated distal right fifth metacarpal fracture.

## 2012-02-20 ENCOUNTER — Encounter (HOSPITAL_COMMUNITY): Payer: Self-pay | Admitting: *Deleted

## 2012-02-20 ENCOUNTER — Emergency Department (HOSPITAL_COMMUNITY)
Admission: EM | Admit: 2012-02-20 | Discharge: 2012-02-20 | Disposition: A | Discharge status: Home / Self Care | Payer: Medicaid Other | Attending: Emergency Medicine | Admitting: Emergency Medicine

## 2012-02-20 ENCOUNTER — Emergency Department (HOSPITAL_COMMUNITY): Payer: Medicaid Other

## 2012-02-20 DIAGNOSIS — R0789 Other chest pain: Secondary | ICD-10-CM

## 2012-02-20 DIAGNOSIS — Z87891 Personal history of nicotine dependence: Secondary | ICD-10-CM | POA: Insufficient documentation

## 2012-02-20 DIAGNOSIS — R071 Chest pain on breathing: Secondary | ICD-10-CM | POA: Insufficient documentation

## 2012-02-20 DIAGNOSIS — Z8739 Personal history of other diseases of the musculoskeletal system and connective tissue: Secondary | ICD-10-CM | POA: Insufficient documentation

## 2012-02-20 DIAGNOSIS — Z8742 Personal history of other diseases of the female genital tract: Secondary | ICD-10-CM | POA: Insufficient documentation

## 2012-02-20 LAB — COMPREHENSIVE METABOLIC PANEL
ALT: 19 U/L (ref 0–35)
AST: 21 U/L (ref 0–37)
Albumin: 4 g/dL (ref 3.5–5.2)
Alkaline Phosphatase: 67 U/L (ref 39–117)
BUN: 8 mg/dL (ref 6–23)
CO2: 26 mEq/L (ref 19–32)
Calcium: 9.4 mg/dL (ref 8.4–10.5)
Chloride: 103 mEq/L (ref 96–112)
Creatinine, Ser: 0.51 mg/dL (ref 0.50–1.10)
GFR calc Af Amer: >90 mL/min (ref >90)
GFR calc non Af Amer: >90 mL/min (ref >90)
Glucose, Bld: 117 mg/dL — ABNORMAL HIGH (ref 70–99)
Potassium: 3.6 mEq/L (ref 3.5–5.1)
Sodium: 139 mEq/L (ref 135–145)
Total Bilirubin: 0.9 mg/dL (ref 0.3–1.2)
Total Protein: 7.8 g/dL (ref 6.0–8.3)

## 2012-02-20 LAB — CBC WITH DIFFERENTIAL/PLATELET
Basophils Absolute: 0 10*3/uL (ref 0.0–0.1)
Basophils Relative: 0 % (ref 0–1)
Eosinophils Absolute: 0 10*3/uL (ref 0.0–0.7)
Eosinophils Relative: 1 % (ref 0–5)
HCT: 37.5 % (ref 36.0–46.0)
Hemoglobin: 12.8 g/dL (ref 12.0–15.0)
Lymphocytes Relative: 37 % (ref 12–46)
Lymphs Abs: 2.8 10*3/uL (ref 0.7–4.0)
MCH: 30.4 pg (ref 26.0–34.0)
MCHC: 34.1 g/dL (ref 30.0–36.0)
MCV: 89.1 fL (ref 78.0–100.0)
Monocytes Absolute: 0.4 10*3/uL (ref 0.1–1.0)
Monocytes Relative: 5 % (ref 3–12)
Neutro Abs: 4.3 10*3/uL (ref 1.7–7.7)
Neutrophils Relative %: 57 % (ref 43–77)
Platelets: 274 10*3/uL (ref 150–400)
RBC: 4.21 MIL/uL (ref 3.87–5.11)
RDW: 12.3 % (ref 11.5–15.5)
WBC: 7.6 10*3/uL (ref 4.0–10.5)

## 2012-02-20 LAB — TROPONIN I: Troponin I: <0.3 ng/mL (ref <0.30)

## 2012-02-20 MED ORDER — IBUPROFEN 800 MG PO TABS: 800.0000 mg | ORAL_TABLET | Freq: Three times a day (TID) | ORAL | Status: DC

## 2012-02-20 NOTE — ED Notes (Signed)
Patient is alert and oriented x3.  He was given DC instructions and follow up visit instructions.  Patient gave verbal understanding.  He was DC ambulatory under his own power to home.  V/S stable.  He was not showing any signs of distress on DC 

## 2012-02-20 NOTE — ED Notes (Signed)
Bed:WA05<BR> Expected date:<BR> Expected time:<BR> Means of arrival:<BR> Comments:<BR> hold

## 2012-02-20 NOTE — Discharge Instructions (Signed)
YOUR LABS AND X-RAY ARE NORMAL HERE SUPPORTING A DIAGNOSIS OF CHEST WALL PAIN. YOU CAN BE DISCHARGED HOME TO FOLLOW UP WITH YOUR DOCTOR FOR RECHECK IN 1-2 DAYS. RETURN HERE AS NEEDED FOR WORSENING SYMPTOMS OR NEW CONCERNS.   Chest Wall Pain Chest wall pain is pain in or around the bones and muscles of your chest. It may take up to 6 weeks to get better. It may take longer if you must stay physically active in your work and activities.  CAUSES  Chest wall pain may happen on its own. However, it may be caused by:  A viral illness like the flu.  Injury.  Coughing.  Exercise.  Arthritis.  Fibromyalgia.  Shingles. HOME CARE INSTRUCTIONS   Avoid overtiring physical activity. Try not to strain or perform activities that cause pain. This includes any activities using your chest or your abdominal and side muscles, especially if heavy weights are used.  Put ice on the sore area.  Put ice in a plastic bag.  Place a towel between your skin and the bag.  Leave the ice on for 15 to 20 minutes per hour while awake for the first 2 days.  Only take over-the-counter or prescription medicines for pain, discomfort, or fever as directed by your caregiver. SEEK IMMEDIATE MEDICAL CARE IF:   Your pain increases, or you are very uncomfortable.  You have a fever.  Your chest pain becomes worse.  You have new, unexplained symptoms.  You have nausea or vomiting.  You feel sweaty or lightheaded.  You have a cough with phlegm (sputum), or you cough up blood. MAKE SURE YOU:   Understand these instructions.  Will watch your condition.  Will get help right away if you are not doing well or get worse. Document Released: 04/07/2005 Document Revised: 06/30/2011 Document Reviewed: 12/02/2010 [facility] Patient Information 2013 [place], [place].

## 2012-02-20 NOTE — ED Notes (Signed)
Pt states "the cp started Sunday night, has been constant for the past 24 hours, feel lightheaded"; pt denies n/v/sweating

## 2012-02-20 NOTE — ED Provider Notes (Signed)
History     CSN: [id number]  Arrival date & time 02/20/12  1252   First MD Initiated Contact with Patient 02/20/12 1632      Chief Complaint  Patient presents with  . Chest Pain    (Consider location/radiation/quality/duration/timing/severity/associated sxs/prior treatment) Patient is a 26 y.o. female presenting with chest pain. The history is provided by the patient.  Chest Pain The chest pain began 1 - 2 weeks ago. Episode frequency: The discomfort was constant until yesterday and now is intermittent. Pertinent negatives for primary symptoms include no fever, no shortness of breath, no cough, no abdominal pain, no nausea and no vomiting. Associated symptoms comments: Left sided chest pain for one week without known injury. No fever or cough. She denies shortness of breath or pain with respiration. No N, V. She feels better with lying down but there are no aggravating factors that makes the pain change or worsen..     Past Medical History  Diagnosis Date  . Bartholin cyst     LEFT  . Cervical dysplasia     Past Surgical History  Procedure Date  . Cesarean section     '05; '07  . Cholecystectomy 2011  . Cervical biopsy  w/ loop electrode excision 2012  . Orif clavicular fracture 06/10/2011    Procedure: OPEN REDUCTION INTERNAL FIXATION (ORIF) CLAVICULAR FRACTURE;  Surgeon: Cammy Copa, MD;  Location: [facility] OR;  Service: Orthopedics;  Laterality: Right;  ORIF Right clavicle    Family History  Problem Relation Age of Onset  . Cancer Paternal Grandmother     CERVICAL    History  Substance Use Topics  . Smoking status: Former Games developer  . Smokeless tobacco: Not on file  . Alcohol Use: Yes     occasional    OB History    Grav Para Term Preterm Abortions TAB SAB Ect Mult Living                  Review of Systems  Constitutional: Negative for fever.  Respiratory: Negative for cough and shortness of breath.   Cardiovascular: Positive for chest pain.    Gastrointestinal: Negative for nausea, vomiting and abdominal pain.  Musculoskeletal: Negative for myalgias.    Allergies  Review of patient's allergies indicates no known allergies.  Home Medications  No current outpatient prescriptions on file.  BP 108/75  Pulse 69  Temp 98.2 F (36.8 C) (Oral)  Resp 16  SpO2 100%  LMP 02/13/2012  Physical Exam  Constitutional: She is oriented to person, place, and time. She appears well-developed and well-nourished. No distress.  HENT:  Head: Normocephalic.  Neck: Normal range of motion. Neck supple.  Cardiovascular: Normal rate and regular rhythm.   No murmur heard. Pulmonary/Chest: Effort normal and breath sounds normal. She has no wheezes. She has no rales.  Abdominal: Soft. Bowel sounds are normal. There is no tenderness. There is no rebound and no guarding.  Musculoskeletal: Normal range of motion. She exhibits no edema.  Neurological: She is alert and oriented to person, place, and time.  Skin: Skin is warm and dry. No rash noted.  Psychiatric: She has a normal mood and affect.    ED Course  Procedures (including critical care time)  Labs Reviewed  COMPREHENSIVE METABOLIC PANEL - Abnormal; Notable for the following:    Glucose, Bld 117 (*)     All other components within normal limits  CBC WITH DIFFERENTIAL  TROPONIN I   Dg Chest 2 View  02/20/2012  *  RADIOLOGY REPORT*  Clinical Data: Left-sided chest pain.  CHEST - 2 VIEW  Comparison: 06/10/2011  Findings: Heart and mediastinal contours are within normal limits. No focal opacities or effusions.  No acute bony abnormality.  IMPRESSION: No active cardiopulmonary disease.   Original Report Authenticated By: Charlett Nose, M.D.      No diagnosis found. 1. Chest wall pain   MDM  Patient's VS are stable without tachycardia or hypoxia. She has no respiratory symptoms, LE pain or swelling and no risk factors for clotting. Doubt PE as source of pain. Normal EKG, negative lab  studies and CXR - doubt infectious etiology. Will treat with supportive care and refer to primary care for recheck in 1-2 days.         Rodena Medin, PA-C 02/20/12 1840

## 2012-02-20 NOTE — ED Notes (Signed)
Had an episode of "heart pounding at school" while getting called on in class- states made me feel like I was going to pass out"

## 2012-02-21 NOTE — ED Provider Notes (Signed)
Medical screening examination/treatment/procedure(s) were performed by non-physician practitioner and as supervising physician I was immediately available for consultation/collaboration.  Raeford Razor, MD 02/21/12 [phone]

## 2013-02-07 ENCOUNTER — Encounter: Payer: Self-pay | Admitting: Family Medicine

## 2013-02-07 ENCOUNTER — Telehealth: Payer: Self-pay | Admitting: Family Medicine

## 2013-02-07 MED ORDER — NORGESTIMATE-ETH ESTRADIOL 0.25-35 MG-MCG PO TABS: 1.0000 | ORAL_TABLET | Freq: Every day | ORAL | Status: DC

## 2013-02-07 NOTE — Telephone Encounter (Signed)
Sprintec 28 tab 28 day.    Medication refill for one time only.  Patient needs to be seen.  Letter sent for patient to call and schedule

## 2013-02-14 ENCOUNTER — Other Ambulatory Visit: Payer: Self-pay | Admitting: Family Medicine

## 2013-02-14 ENCOUNTER — Telehealth: Payer: Self-pay | Admitting: Family Medicine

## 2013-02-14 NOTE — Telephone Encounter (Signed)
Pt NTBS.  Has been sent letter to make appt.  Refill BCP denied

## 2013-02-14 NOTE — Telephone Encounter (Signed)
ntbs

## 2013-02-14 NOTE — Telephone Encounter (Signed)
Denied NTBS  Letter was sent last month

## 2013-02-17 ENCOUNTER — Other Ambulatory Visit: Payer: Self-pay | Admitting: Family Medicine

## 2013-02-24 ENCOUNTER — Other Ambulatory Visit: Payer: Self-pay

## 2013-03-11 ENCOUNTER — Encounter: Payer: Self-pay | Admitting: Family Medicine

## 2013-03-11 ENCOUNTER — Ambulatory Visit (INDEPENDENT_AMBULATORY_CARE_PROVIDER_SITE_OTHER): Payer: Medicaid Other | Admitting: Family Medicine

## 2013-03-11 VITALS — BP 110/74 | HR 68 | Temp 98.2°F | Resp 14 | Ht 62.0 in | Wt 130.0 lb

## 2013-03-11 DIAGNOSIS — Z0000 Encounter for general adult medical examination without abnormal findings: Secondary | ICD-10-CM

## 2013-03-11 DIAGNOSIS — Z309 Encounter for contraceptive management, unspecified: Secondary | ICD-10-CM

## 2013-03-11 MED ORDER — NORGESTIMATE-ETH ESTRADIOL 0.25-35 MG-MCG PO TABS: 1.0000 | ORAL_TABLET | Freq: Every day | ORAL | Status: DC

## 2013-03-11 NOTE — Progress Notes (Signed)
Subjective:    Patient ID: [name], female    DOB: 1986/03/10, 27 y.o.   MRN: [id number]  HPI  Patient is a 27 year old white female who comes in today for a refill of her birth control. Her past medical history is significant for HSIL on Pap smear in 2012. She underwent LEEP procedure. She had Pap smears every 6 months with 3 normal readings.  I performed a Pap smear last year and it was normal with no cancerous changes. She does not want a Pap smear this year. She would like to start screening every 3 years. She denies any vaginal bleeding or dyspareunia. The remainder of her review of systems is negative. Overall she is doing extremely well. Past Medical History  Diagnosis Date  . Bartholin cyst     LEFT  . Cervical dysplasia    Past Surgical History  Procedure Laterality Date  . Cesarean section      '05; '07  . Cholecystectomy  2011  . Cervical biopsy  w/ loop electrode excision  2012  . Orif clavicular fracture  06/10/2011    Procedure: OPEN REDUCTION INTERNAL FIXATION (ORIF) CLAVICULAR FRACTURE;  Surgeon: Cammy Copa, MD;  Location: [facility] OR;  Service: Orthopedics;  Laterality: Right;  ORIF Right clavicle   Current Outpatient Prescriptions on File Prior to Visit  Medication Sig Dispense Refill  . ibuprofen (ADVIL,MOTRIN) 800 MG tablet Take 1 tablet (800 mg total) by mouth 3 (three) times daily.  21 tablet  0   No current facility-administered medications on file prior to visit.   No Known Allergies History   Social History  . Marital Status: Single    Spouse Name: N/A    Number of Children: N/A  . Years of Education: N/A   Occupational History  . Not on file.   Social History Main Topics  . Smoking status: Former Games developer  . Smokeless tobacco: Not on file  . Alcohol Use: Yes     Comment: occasional  . Drug Use: No  . Sexual Activity:    Other Topics Concern  . Not on file   Social History Narrative  . No narrative on file   patient has 2 children age  47 and 62.  Family History  Problem Relation Age of Onset  . Cancer Paternal Grandmother     CERVICAL  . Diabetes Father     Review of Systems  All other systems reviewed and are negative.       Objective:   Physical Exam  Vitals reviewed. Constitutional: She is oriented to person, place, and time. She appears well-developed and well-nourished. No distress.  HENT:  Head: Normocephalic and atraumatic.  Right Ear: External ear normal.  Left Ear: External ear normal.  Nose: Nose normal.  Mouth/Throat: Oropharynx is clear and moist. No oropharyngeal exudate.  Eyes: Conjunctivae and EOM are normal. Pupils are equal, round, and reactive to light. Right eye exhibits no discharge. Left eye exhibits no discharge. No scleral icterus.  Neck: Normal range of motion. Neck supple. No JVD present. No tracheal deviation present. No thyromegaly present.  Cardiovascular: Normal rate, regular rhythm, normal heart sounds and intact distal pulses.  Exam reveals no gallop and no friction rub.   No murmur heard. Pulmonary/Chest: Effort normal and breath sounds normal. No stridor. No respiratory distress. She has no wheezes. She has no rales. She exhibits no tenderness.  Abdominal: Soft. Bowel sounds are normal. She exhibits no distension and no mass. There is no tenderness.  There is no rebound and no guarding.  Musculoskeletal: Normal range of motion. She exhibits no edema and no tenderness.  Lymphadenopathy:    She has no cervical adenopathy.  Neurological: She is alert and oriented to person, place, and time. She has normal reflexes. She displays normal reflexes. No cranial nerve deficit. She exhibits normal muscle tone. Coordination normal.  Skin: Skin is warm. No rash noted. She is not diaphoretic. No erythema. No pallor.  Psychiatric: She has a normal mood and affect. Her behavior is normal. Judgment and thought content normal.   patient declines pelvic exam today and declines Pap smear         Assessment & Plan:  1. Routine general medical examination at a health care facility I recommended a Pap smear today but patient declines.  She consents to a Pap smear next year. She was more comfortable seeing a female provider for a Pap smear. Therefore she'll return next year for Pap smear. The remainder of her physical exam is normal. I will refill her birth control. I also asked the patient return fasting for CBC CMP and fasting lipid panel. I recommended a flu shot as well as a TdaP the patient declined. 2. Contraception management - norgestimate-ethinyl estradiol (ORTHO-CYCLEN,SPRINTEC,PREVIFEM) 0.25-35 MG-MCG tablet; Take 1 tablet by mouth daily.  Dispense: 1 Package; Refill: 11

## 2013-10-17 ENCOUNTER — Encounter: Payer: Self-pay | Admitting: Family Medicine

## 2013-10-17 ENCOUNTER — Ambulatory Visit (INDEPENDENT_AMBULATORY_CARE_PROVIDER_SITE_OTHER): Payer: Medicaid Other | Admitting: Family Medicine

## 2013-10-17 VITALS — BP 120/90 | HR 98 | Temp 98.5°F | Resp 16 | Ht 62.0 in | Wt 126.0 lb

## 2013-10-17 DIAGNOSIS — F32A Depression, unspecified: Secondary | ICD-10-CM

## 2013-10-17 DIAGNOSIS — F329 Major depressive disorder, single episode, unspecified: Secondary | ICD-10-CM

## 2013-10-17 DIAGNOSIS — F3289 Other specified depressive episodes: Secondary | ICD-10-CM

## 2013-10-17 MED ORDER — ALPRAZOLAM 0.5 MG PO TABS: 0.5000 mg | ORAL_TABLET | Freq: Three times a day (TID) | ORAL | Status: DC | PRN

## 2013-10-17 MED ORDER — SERTRALINE HCL 50 MG PO TABS: 75.0000 mg | ORAL_TABLET | Freq: Every day | ORAL | Status: DC

## 2013-10-17 NOTE — Progress Notes (Signed)
   Subjective:    Patient ID: [name], female    DOB: 07/31/1985, 28 y.o.   MRN: [id number]  HPI Patient is a very pleasant 28 year old white female who presents today complaining of depression and anxiety. Symptoms began to get steadily worse starting in January. At that time her best friend committed suicide. Since that time she developed worsening depression, anhedonia, insomnia, poor appetite, and poor energy. She denies any suicidal ideation. She is having panic attacks multiple times during the week and constant generalized anxiety. She is tried Lexapro in the past but this is caused her side effects. She's also tried Zoloft in the past which seemed for her. She is interested in starting Zoloft.  She is currently in her last year for school and her grades are starting to suffer due to her anxiety. She's also experiencing mild agoraphobia. Past Medical History  Diagnosis Date  . Bartholin cyst     LEFT  . Cervical dysplasia   . Depression    Current Outpatient Prescriptions on File Prior to Visit  Medication Sig Dispense Refill  . ibuprofen (ADVIL,MOTRIN) 800 MG tablet Take 1 tablet (800 mg total) by mouth 3 (three) times daily.  21 tablet  0  . norgestimate-ethinyl estradiol (ORTHO-CYCLEN,SPRINTEC,PREVIFEM) 0.25-35 MG-MCG tablet Take 1 tablet by mouth daily.  1 Package  11   No current facility-administered medications on file prior to visit.   No Known Allergies History   Social History  . Marital Status: Single    Spouse Name: N/A    Number of Children: N/A  . Years of Education: N/A   Occupational History  . Not on file.   Social History Main Topics  . Smoking status: Former Games developermoker  . Smokeless tobacco: Not on file  . Alcohol Use: Yes     Comment: occasional  . Drug Use: No  . Sexual Activity:    Other Topics Concern  . Not on file   Social History Narrative  . No narrative on file      Review of Systems  All other systems reviewed and are  negative.      Objective:   Physical Exam  Vitals reviewed. Cardiovascular: Normal rate and regular rhythm.   Pulmonary/Chest: Effort normal and breath sounds normal.  Psychiatric: She has a normal mood and affect. Her behavior is normal. Judgment and thought content normal.   patient is quiet and tearful on exam        Assessment & Plan:  1. Depression Begin Zoloft 50 mg by mouth each bedtime for one week. After one week increase to 75 mg by mouth each bedtime. Recheck in 4 weeks. Also gave patient prescription for Xanax 0.5 mg by mouth every 8 hours when necessary panic attacks or insomnia.  Recheck in 4 weeks or sooner if worse. - sertraline (ZOLOFT) 50 MG tablet; Take 1.5 tablets (75 mg total) by mouth daily.  Dispense: 60 tablet; Refill: 3 - ALPRAZolam (XANAX) 0.5 MG tablet; Take 1 tablet (0.5 mg total) by mouth 3 (three) times daily as needed for anxiety.  Dispense: 30 tablet; Refill: 0

## 2013-10-25 ENCOUNTER — Emergency Department (HOSPITAL_COMMUNITY)
Admission: EM | Admit: 2013-10-25 | Discharge: 2013-10-25 | Disposition: A | Discharge status: Home / Self Care | Payer: Medicaid Other | Attending: Emergency Medicine | Admitting: Emergency Medicine

## 2013-10-25 ENCOUNTER — Encounter (HOSPITAL_COMMUNITY): Payer: Self-pay | Admitting: Emergency Medicine

## 2013-10-25 DIAGNOSIS — Z3202 Encounter for pregnancy test, result negative: Secondary | ICD-10-CM | POA: Insufficient documentation

## 2013-10-25 DIAGNOSIS — Z79899 Other long term (current) drug therapy: Secondary | ICD-10-CM | POA: Diagnosis not present

## 2013-10-25 DIAGNOSIS — F329 Major depressive disorder, single episode, unspecified: Secondary | ICD-10-CM | POA: Insufficient documentation

## 2013-10-25 DIAGNOSIS — F3289 Other specified depressive episodes: Secondary | ICD-10-CM | POA: Insufficient documentation

## 2013-10-25 DIAGNOSIS — Z87891 Personal history of nicotine dependence: Secondary | ICD-10-CM | POA: Diagnosis not present

## 2013-10-25 DIAGNOSIS — N750 Cyst of Bartholin's gland: Secondary | ICD-10-CM | POA: Diagnosis present

## 2013-10-25 LAB — URINE MICROSCOPIC-ADD ON

## 2013-10-25 LAB — POC URINE PREG, ED: Preg Test, Ur: NEGATIVE

## 2013-10-25 LAB — URINALYSIS, ROUTINE W REFLEX MICROSCOPIC
Bilirubin Urine: NEGATIVE
Glucose, UA: NEGATIVE mg/dL
Hgb urine dipstick: NEGATIVE
KETONES UR: NEGATIVE mg/dL
NITRITE: NEGATIVE
PROTEIN: NEGATIVE mg/dL
Specific Gravity, Urine: 1.005 (ref 1.005–1.030)
Urobilinogen, UA: 0.2 mg/dL (ref 0.0–1.0)
pH: 6 (ref 5.0–8.0)

## 2013-10-25 MED ORDER — MORPHINE SULFATE 4 MG/ML IJ SOLN
4.0000 mg | Freq: Once | INTRAMUSCULAR | Status: AC
  Administered 2013-10-25: 4 mg via INTRAVENOUS
  Filled 2013-10-25: qty 1

## 2013-10-25 NOTE — ED Notes (Signed)
Patient with Bartholin Cyst.  Patient states that it is large and she is having pain when she walks and pain is radiating into abdomen.  Patient denies any nausea or vomiting at this time.

## 2013-10-25 NOTE — Discharge Instructions (Signed)
Please follow with your primary care doctor in the next 2 days for a check-up. They must obtain records for further management.  ° °Do not hesitate to return to the Emergency Department for any new, worsening or concerning symptoms.  ° °

## 2013-10-25 NOTE — ED Provider Notes (Signed)
CSN: [id number]     Arrival date & time 10/25/13  2055 History  This chart was scribed for non-physician practitioner, Wynetta EmeryNicole Almeda Ezra, [id number] working with Glynn OctaveStephen Rancour, MD by Greggory StallionKayla Andersen, ED scribe. This patient was seen in room TR11C/TR11C and the patient's care was started at 10:01 PM.   Chief Complaint  Patient presents with  . Cyst   The history is provided by the patient. No language interpreter was used.   HPI Comments: Claudia MilianKelly N Clendenin is a 28 y.o. female who presents to the Emergency Department complaining of a bartholin's cyst that started. Pain is starting to radiate to her abdomen. Reports subjective fever and chills. States the pain is worsened with ambulation and sitting down. Pt states her OB-GYN noticed the cyst several years ago but nothing was done because it was small. States she has never had it I&D'd. Pt tried calling her OB but can not get an appointment for another 3 days.   Past Medical History  Diagnosis Date  . Bartholin cyst     LEFT  . Cervical dysplasia   . Depression    Past Surgical History  Procedure Laterality Date  . Cesarean section      '05; '07  . Cholecystectomy  2011  . Cervical biopsy  w/ loop electrode excision  2012  . Orif clavicular fracture  06/10/2011    Procedure: OPEN REDUCTION INTERNAL FIXATION (ORIF) CLAVICULAR FRACTURE;  Surgeon: Cammy CopaGregory Scott Dean, MD;  [facility];  Service: Orthopedics;  Laterality: Right;  ORIF Right clavicle   Family History  Problem Relation Age of Onset  . Cancer Paternal Grandmother     CERVICAL  . Diabetes Father    History  Substance Use Topics  . Smoking status: Former Games developermoker  . Smokeless tobacco: Not on file  . Alcohol Use: Yes     Comment: occasional   OB History   Grav Para Term Preterm Abortions TAB SAB Ect Mult Living                 Review of Systems  Constitutional: Positive for fever (subjective) and chills.  Skin:       Cyst.  All other systems reviewed and are  negative.  Allergies  Review of patient's allergies indicates no known allergies.  Home Medications   Prior to Admission medications   Medication Sig Start Date End Date Taking? Authorizing Provider  ALPRAZolam Prudy Feeler(XANAX) 0.5 MG tablet Take 0.5 mg by mouth daily as needed for anxiety.   Yes Historical Provider, MD  norgestimate-ethinyl estradiol (ORTHO-CYCLEN,SPRINTEC,PREVIFEM) 0.25-35 MG-MCG tablet Take 1 tablet by mouth daily. 03/11/13  Yes Donita BrooksWarren T Pickard, MD  sertraline (ZOLOFT) 50 MG tablet Take 50 mg by mouth daily.   Yes Historical Provider, MD   BP 114/75  Pulse 79  Temp(Src) 98.5 F (36.9 C) (Oral)  Resp 16  SpO2 99%  LMP 10/04/2013  Physical Exam  Nursing note and vitals reviewed. Constitutional: She is oriented to person, place, and time. She appears well-developed and well-nourished. No distress.  HENT:  Head: Normocephalic.  Eyes: Conjunctivae and EOM are normal.  Cardiovascular: Normal rate.   Pulmonary/Chest: Effort normal. No stridor.  Genitourinary:  5 cm fluctuant Bartholin's cyst on the left.   Musculoskeletal: Normal range of motion.  Neurological: She is alert and oriented to person, place, and time.  Psychiatric: She has a normal mood and affect.    ED Course  Procedures (including critical care time)  DIAGNOSTIC STUDIES: Oxygen Saturation is 99%  on RA, normal by my interpretation.    COORDINATION OF CARE: 10:03 PM-Discussed treatment plan which includes I&D with pt at bedside and pt agreed to plan.   INCISION AND DRAINAGE Performed by: Wynetta EmeryNicole Yusra Ravert, PA-C Consent: Verbal consent obtained. Risks and benefits: risks, benefits and alternatives were discussed Type: Bartholin's cyst  Body area: left labia  Anesthesia: local infiltration  Incision was made with a scalpel.  Local anesthetic: lidocaine 2% with epinephrine  Anesthetic total: 5 ml  Complexity: complex Blunt dissection to break up loculations  Drainage: purulent  Drainage  amount: copious  Packing material: word catheter   Patient tolerance: Patient tolerated the procedure well with no immediate complications.  Labs Review Labs Reviewed  URINALYSIS, ROUTINE W REFLEX MICROSCOPIC - Abnormal; Notable for the following:    Color, Urine STRAW (*)    Leukocytes, UA SMALL (*)    All other components within normal limits  WOUND CULTURE  URINE MICROSCOPIC-ADD ON  POC URINE PREG, ED    Imaging Review No results found.   EKG Interpretation None      MDM   Final diagnoses:  Cyst of left Bartholin's gland    Filed Vitals:   10/25/13 2112 10/25/13 2256  BP: 126/90 114/75  Pulse: 93 79  Temp: 98.5 F (36.9 C)   TempSrc: Oral   Resp: 20 16  SpO2: 99% 99%    Medications  morphine 4 MG/ML injection 4 mg (4 mg Intravenous Given 10/25/13 2215)    Claudia MilianKelly N Graham is a 28 y.o. female presenting with left-sided Bartholin's gland abscess. Incision and drainage performed in [place] catheter is placed. Advised patient on sitz baths. Advised her to follow with primary care who performs her GYN care in the next week for a checkup. Discussed return precautions.  Evaluation does not show pathology that would require ongoing emergent intervention or inpatient treatment. Pt is hemodynamically stable and mentating appropriately. Discussed findings and plan with patient/guardian, who agrees with care plan. All questions answered. Return precautions discussed and outpatient follow up given.       I personally performed the services described in this documentation, which was scribed in my presence. The recorded information has been reviewed and is accurate.  Wynetta Emeryicole Chosen Garron, PA-C 10/26/13 [phone]

## 2013-10-26 ENCOUNTER — Encounter: Payer: Self-pay | Admitting: Physician Assistant

## 2013-10-26 ENCOUNTER — Ambulatory Visit (INDEPENDENT_AMBULATORY_CARE_PROVIDER_SITE_OTHER): Payer: Medicaid Other | Admitting: Physician Assistant

## 2013-10-26 VITALS — BP 130/90 | HR 78 | Temp 97.2°F | Resp 18 | Ht 62.0 in | Wt 129.0 lb

## 2013-10-26 DIAGNOSIS — N750 Cyst of Bartholin's gland: Secondary | ICD-10-CM

## 2013-10-26 MED ORDER — HYDROCODONE-ACETAMINOPHEN 5-325 MG PO TABS: 1.0000 | ORAL_TABLET | ORAL | Status: DC | PRN

## 2013-10-26 MED ORDER — SULFAMETHOXAZOLE-TMP DS 800-160 MG PO TABS: 1.0000 | ORAL_TABLET | Freq: Two times a day (BID) | ORAL | Status: DC

## 2013-10-26 NOTE — ED Provider Notes (Signed)
Medical screening examination/treatment/procedure(s) were performed by non-physician practitioner and as supervising physician I was immediately available for consultation/collaboration.   EKG Interpretation None       Glynn OctaveStephen Hesham Womac, MD 10/26/13 [phone]

## 2013-10-26 NOTE — Progress Notes (Signed)
Patient ID: Claudia MilianKelly N Betzler MRN: [id number], DOB: 03/04/1986, 28 y.o. Date of Encounter: 10/26/2013, 2:09 PM    Chief Complaint:  Chief Complaint  Patient presents with  . Cyst drained yesterday and now in alot of pain     HPI: 28 y.o. year old white female here for followup after ER visit yesterday.  I have reviewed the ER records in Epic.  She presented to the ER on 10/25/13 complaining of a Bartholin's cyst that had become enlarged and painful. Also reported subjective fever and chills. Reported that her OB/GYN had noticed a cyst several years ago but nothing had been done because it was small. Reported she had never had it I & D ed. Reported that she tried calling her GYN but could not get an appointment for another 3 days. The exam in the ER was notable for a 5 cm fluctuant Bartholin's cyst on the left.  ER performed incision and drainage. Report states that copious amount of purulent drainage came from the site. Report also states that they did blunt dissection to break up loculations. Report states that the packing material they used was a Word catheter. Report states that they sent did send a wound culture. They did get morphine 4 mg IV in the ER. They prescribed no pain medications when she was discharged from the ER. Their note states  " incision and drainage performed and Word catheter is placed. Advised pt on sitz baths. Advised her to followup with primary care who performs her GYN care in the next week for a checkup." Does not state anything about who is to remove the catheter or when.  Patient states that she was told to followup with her PCP in one week and to follow up again in 4 weeks to have the catheter removed.  Says that she was given no prescription for pain medicines or any type of medication at all when she left the ER. Says that this morning she got dressed and went in to work but then once she was there realized there was absolutely no way she could stay there  secondary to severe pain. 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   Home Meds:   Outpatient Prescriptions Prior to Visit  Medication Sig Dispense Refill  . ALPRAZolam (XANAX) 0.5 MG tablet Take 0.5 mg by mouth daily as needed for anxiety.      . norgestimate-ethinyl estradiol (ORTHO-CYCLEN,SPRINTEC,PREVIFEM) 0.25-35 MG-MCG tablet Take 1 tablet by mouth daily.  1 Package  11  . sertraline (ZOLOFT) 50 MG tablet Take 50 mg by mouth daily.       No facility-administered medications prior to visit.    Allergies: No Known Allergies    Review of Systems: See HPI for pertinent ROS. All other ROS negative.    Physical Exam: Blood pressure 130/90, pulse 78, temperature 97.2 F (36.2 C), temperature source Oral, resp. rate 18, height 5\' 2"  (1.575 m), weight 129 lb (58.514 kg), last menstrual period 10/04/2013., Body mass index is 23.59 kg/(m^2). General: WNWD WF.  Appears in no acute distress. However, she does appear to be in some pain so I did minimal exam, with her standing.  Catheter tube is visualized, coming down, out of left vulva area.  Left vulva is swollen and enlarged compared to the right.  Soft and not firm.  She is wearing a pad in her underwear--says has been having bloody, purulent drainage.  Neck: Supple. No  thyromegaly. No lymphadenopathy. Lungs: Clear bilaterally to auscultation without wheezes, rales, or rhonchi. Breathing is unlabored. Heart: Regular rhythm. No murmurs, rubs, or gallops. Msk:  Strength and tone normal for age. Extremities/Skin: Warm and dry. Neuro: Alert and oriented X 3. Moves all extremities spontaneously. Gait is normal. CNII-XII grossly in tact. Psych:  Responds to questions appropriately with a normal affect.     ASSESSMENT AND PLAN:  28 y.o. year old female with  1. Infected cyst of Bartholin's gland duct Will add antibiotic Bactrim. Wrote her out of work for the rest of this week. Gave note for out of work  10/26/13 through 10/28/13. She is then off work for the weekend which is the 11th and 12th. She is given hydrocodone to use as needed for severe pain. I am scheduling a referral to her gynecologist Dr. Audie BoxFontaine for followup--marked as urgent. Follow up immediately if site seems to be worsening. - sulfamethoxazole-trimethoprim (BACTRIM DS) 800-160 MG per tablet; Take 1 tablet by mouth 2 (two) times daily.  Dispense: 20 tablet; Refill: 0 - HYDROcodone-acetaminophen (NORCO/VICODIN) 5-325 MG per tablet; Take 1 tablet by mouth every 4 (four) hours as needed.  Dispense: 40 tablet; Refill: 0 - Ambulatory referral to Gynecology   Signed, [facility]CM[facility] Beth PortalDixon, [place], [facility] 10/26/2013 2:09 PM

## 2013-10-28 LAB — WOUND CULTURE: CULTURE: NO GROWTH

## 2013-11-01 ENCOUNTER — Encounter: Payer: Self-pay | Admitting: Obstetrics & Gynecology

## 2013-11-08 ENCOUNTER — Encounter (HOSPITAL_COMMUNITY): Payer: Self-pay | Admitting: Emergency Medicine

## 2013-11-08 ENCOUNTER — Emergency Department (HOSPITAL_COMMUNITY)
Admission: EM | Admit: 2013-11-08 | Discharge: 2013-11-08 | Disposition: A | Discharge status: Home / Self Care | Payer: Medicaid Other | Attending: Emergency Medicine | Admitting: Emergency Medicine

## 2013-11-08 DIAGNOSIS — Z8742 Personal history of other diseases of the female genital tract: Secondary | ICD-10-CM | POA: Insufficient documentation

## 2013-11-08 DIAGNOSIS — F3289 Other specified depressive episodes: Secondary | ICD-10-CM | POA: Insufficient documentation

## 2013-11-08 DIAGNOSIS — F329 Major depressive disorder, single episode, unspecified: Secondary | ICD-10-CM | POA: Diagnosis not present

## 2013-11-08 DIAGNOSIS — R21 Rash and other nonspecific skin eruption: Secondary | ICD-10-CM | POA: Diagnosis present

## 2013-11-08 DIAGNOSIS — Z79899 Other long term (current) drug therapy: Secondary | ICD-10-CM | POA: Insufficient documentation

## 2013-11-08 DIAGNOSIS — J029 Acute pharyngitis, unspecified: Secondary | ICD-10-CM | POA: Insufficient documentation

## 2013-11-08 DIAGNOSIS — Z87891 Personal history of nicotine dependence: Secondary | ICD-10-CM | POA: Diagnosis not present

## 2013-11-08 LAB — RAPID STREP SCREEN (MED CTR MEBANE ONLY): STREPTOCOCCUS, GROUP A SCREEN (DIRECT): NEGATIVE

## 2013-11-08 MED ORDER — DEXAMETHASONE 4 MG PO TABS
10.0000 mg | ORAL_TABLET | Freq: Once | ORAL | Status: AC
  Administered 2013-11-08: 10 mg via ORAL
  Filled 2013-11-08: qty 3

## 2013-11-08 MED ORDER — DIPHENHYDRAMINE HCL 25 MG PO CAPS
25.0000 mg | ORAL_CAPSULE | Freq: Once | ORAL | Status: AC
  Administered 2013-11-08: 25 mg via ORAL
  Filled 2013-11-08: qty 1

## 2013-11-08 MED ORDER — FAMOTIDINE 20 MG PO TABS: 20.0000 mg | ORAL_TABLET | Freq: Once | ORAL | Status: DC

## 2013-11-08 MED ORDER — DIPHENHYDRAMINE HCL 25 MG PO CAPS: 25.0000 mg | ORAL_CAPSULE | Freq: Three times a day (TID) | ORAL | Status: DC | PRN

## 2013-11-08 MED ORDER — FAMOTIDINE 20 MG PO TABS
20.0000 mg | ORAL_TABLET | Freq: Once | ORAL | Status: AC
  Administered 2013-11-08: 20 mg via ORAL
  Filled 2013-11-08: qty 1

## 2013-11-08 NOTE — ED Notes (Signed)
Pt continues to be monitored by 5 lead, blood pressure, and pulse ox.  

## 2013-11-08 NOTE — ED Notes (Signed)
Pt reports being seen here two weeks ago for cyst and started on antibiotics, having rash since then and now having flu like symptoms that she thinks is allergic reaction but has not taken any benadryl pta. Airway intact.

## 2013-11-08 NOTE — ED Provider Notes (Signed)
CSN: [id number]     Arrival date & time 11/08/13  1037 History   First MD Initiated Contact with Patient 11/08/13 1105     Chief Complaint  Patient presents with  . Rash  . Allergic Reaction     (Consider location/radiation/quality/duration/timing/severity/associated sxs/prior Treatment) HPI  This is a [age] year old female who presents with concerns for allergic reaction. Reports recent Bactrim use for an abscess. She states that she has finished her course of Bactrim. Over the weekend she noted a rash that was over her entire body. The rash is cleared up she is sensed noted sore throat and difficulty swallowing. She states that she feels like her throat is closing up. She denies any shortness of breath but does state she feels is difficult to catch her breath because of her feelings in her throat. She reports chills without documented fevers. She denies any chest pain or cough. Known sick contacts. She has not taken anything for her symptoms.  Past Medical History  Diagnosis Date  . Bartholin cyst     LEFT  . Cervical dysplasia   . Depression    Past Surgical History  Procedure Laterality Date  . Cesarean section      '05; '07  . Cholecystectomy  2011  . Cervical biopsy  w/ loop electrode excision  2012  . Orif clavicular fracture  06/10/2011    Procedure: OPEN REDUCTION INTERNAL FIXATION (ORIF) CLAVICULAR FRACTURE;  Surgeon: Cammy CopaGregory Scott Dean, MD;  [facility];  Service: Orthopedics;  Laterality: Right;  ORIF Right clavicle   Family History  Problem Relation Age of Onset  . Cancer Paternal Grandmother     CERVICAL  . Diabetes Father    History  Substance Use Topics  . Smoking status: Former Games developermoker  . Smokeless tobacco: Not on file  . Alcohol Use: Yes     Comment: occasional   OB History   Grav Para Term Preterm Abortions TAB SAB Ect Mult Living                 Review of Systems  Constitutional: Negative for fever.  HENT: Positive for sore throat and trouble  swallowing.   Respiratory: Negative for cough, chest tightness and shortness of breath.   Cardiovascular: Negative for chest pain.  Gastrointestinal: Negative for nausea, vomiting and abdominal pain.  Genitourinary: Negative for dysuria.  Musculoskeletal: Negative for back pain.  Skin: Positive for rash.  Neurological: Negative for headaches.  All other systems reviewed and are negative.     Allergies  Review of patient's allergies indicates no known allergies.  Home Medications   Prior to Admission medications   Medication Sig Start Date End Date Taking? Authorizing Provider  ALPRAZolam Prudy Feeler(XANAX) 0.5 MG tablet Take 0.5 mg by mouth daily as needed for anxiety.   Yes Historical Provider, MD  HYDROcodone-acetaminophen (NORCO/VICODIN) 5-325 MG per tablet Take 2 tablets by mouth every 4 (four) hours as needed for moderate pain.   Yes Historical Provider, MD  norgestimate-ethinyl estradiol (ORTHO-CYCLEN,SPRINTEC,PREVIFEM) 0.25-35 MG-MCG tablet Take 1 tablet by mouth daily. 03/11/13  Yes Donita BrooksWarren T Pickard, MD  sertraline (ZOLOFT) 50 MG tablet Take 50 mg by mouth daily.   Yes Historical Provider, MD  diphenhydrAMINE (BENADRYL) 25 mg capsule Take 1 capsule (25 mg total) by mouth every 8 (eight) hours as needed for allergies. 11/08/13   Shon Batonourtney F Horton, MD  famotidine (PEPCID) 20 MG tablet Take 1 tablet (20 mg total) by mouth once. 11/08/13   Shon Batonourtney F Horton, MD  BP 106/76  Pulse 65  Temp(Src) 98.6 F (37 C) (Oral)  Resp 17  SpO2 99%  LMP 10/04/2013 Physical Exam  Nursing note and vitals reviewed. Constitutional: She is oriented to person, place, and time. She appears well-developed and well-nourished. No distress.  HENT:  Head: Normocephalic and atraumatic.  Mouth/Throat: Oropharynx is clear and moist. No oropharyngeal exudate.  Uvula midline, mild erythema  Eyes: Pupils are equal, round, and reactive to light.  Neck: Neck supple.  Cardiovascular: Normal rate, regular rhythm and  normal heart sounds.   No murmur heard. Pulmonary/Chest: Effort normal and breath sounds normal. No respiratory distress. She has no wheezes.  Abdominal: Soft. Bowel sounds are normal. There is no tenderness.  Lymphadenopathy:    She has cervical adenopathy.  Neurological: She is alert and oriented to person, place, and time.  Skin: Skin is warm and dry. No rash noted.  Psychiatric: She has a normal mood and affect.    ED Course  Procedures (including critical care time) Labs Review Labs Reviewed  RAPID STREP SCREEN  CULTURE, GROUP A STREP    Imaging Review No results found.   EKG Interpretation None      MDM   Final diagnoses:  Sore throat    Patient presents with sore throat and recent rash. Concern for allergic reaction. No rash on examination. Oropharynx is clear without erythema. Strep screen was sent and is negative. Patient does also report chills and patient may have early pharyngitis. Patient was given Decadron, Benadryl, and Pepcid to cover for any possible reaction. She was monitored without any further symptoms and had improvement of symptoms. My suspicion is this is likely a viral pharyngitis; however, have encouraged patient to continue Pepcid and Benadryl at home as needed if her symptoms continue. She was given strict return precautions.  After history, exam, and medical workup I feel the patient has been appropriately medically screened and is safe for discharge home. Pertinent diagnoses were discussed with the patient. Patient was given return precautions.     Shon Baton, MD 11/08/13 2016

## 2013-11-08 NOTE — Discharge Instructions (Signed)
Sore Throat A sore throat is pain, burning, irritation, or scratchiness of the throat. There is often pain or tenderness when swallowing or talking. A sore throat may be accompanied by other symptoms, such as coughing, sneezing, fever, and swollen neck glands. A sore throat is often the first sign of another sickness, such as a cold, flu, strep throat, or mononucleosis (commonly known as mono). Most sore throats go away without medical treatment.  Is unclear if her symptoms are related to allergic reaction or viral illness. Strep screen is negative. See return precautions below.  CAUSES  The most common causes of a sore throat include:  A viral infection, such as a cold, flu, or mono.  A bacterial infection, such as strep throat, tonsillitis, or whooping cough.  Seasonal allergies.  Dryness in the air.  Irritants, such as smoke or pollution.  Gastroesophageal reflux disease (GERD). HOME CARE INSTRUCTIONS   Only take over-the-counter medicines as directed by your caregiver.  Drink enough fluids to keep your urine clear or pale yellow.  Rest as needed.  Try using throat sprays, lozenges, or sucking on hard candy to ease any pain (if older than 4 years or as directed).  Sip warm liquids, such as broth, herbal tea, or warm water with honey to relieve pain temporarily. You may also eat or drink cold or frozen liquids such as frozen ice pops.  Gargle with salt water (mix 1 tsp salt with 8 oz of water).  Do not smoke and avoid secondhand smoke.  Put a cool-mist humidifier in your bedroom at night to moisten the air. You can also turn on a hot shower and sit in the bathroom with the door closed for 5-10 minutes. SEEK IMMEDIATE MEDICAL CARE IF:  You have difficulty breathing.  You are unable to swallow fluids, soft foods, or your saliva.  You have increased swelling in the throat.  Your sore throat does not get better in 7 days.  You have nausea and vomiting.  You have a fever  or persistent symptoms for more than 2-3 days.  You have a fever and your symptoms suddenly get worse. MAKE SURE YOU:   Understand these instructions.  Will watch your condition.  Will get help right away if you are not doing well or get worse. Document Released: 05/15/2004 Document Revised: 03/24/2012 Document Reviewed: 12/14/2011 [facility] Patient Information 2015 [employer] [place]. This information is not intended to replace advice given to you by your health care provider. Make sure you discuss any questions you have with your health care provider.

## 2013-11-09 ENCOUNTER — Ambulatory Visit (INDEPENDENT_AMBULATORY_CARE_PROVIDER_SITE_OTHER): Payer: Medicaid Other | Admitting: Physician Assistant

## 2013-11-09 ENCOUNTER — Encounter: Payer: Self-pay | Admitting: Physician Assistant

## 2013-11-09 VITALS — BP 108/76 | HR 76 | Temp 97.2°F | Resp 18 | Wt 125.0 lb

## 2013-11-09 DIAGNOSIS — N750 Cyst of Bartholin's gland: Secondary | ICD-10-CM

## 2013-11-09 MED ORDER — TRAMADOL HCL 50 MG PO TABS: 50.0000 mg | ORAL_TABLET | Freq: Three times a day (TID) | ORAL | Status: DC | PRN

## 2013-11-09 NOTE — Progress Notes (Signed)
Patient ID: [name] MRN: [id number], DOB: 07/23/1985, 28 y.o. Date of Encounter: 11/09/2013, 1:33 PM    Chief Complaint:  Chief Complaint  Patient presents with  . problems after having Barth. cyst removed    tired, rash(mostly cleared)     HPI: 28 y.o. year old white female here for followup.  THE FOLLOWING IS COPIED FROM HER OV WITH ME 10/26/2013:  I have reviewed the ER records in Epic.  She presented to the ER on 10/25/13 complaining of a Bartholin's cyst that had become enlarged and painful. Also reported subjective fever and chills. Reported that her OB/GYN had noticed a cyst several years ago but nothing had been done because it was small. Reported she had never had it I & D ed. Reported that she tried calling her GYN but could not get an appointment for another 3 days. The exam in the ER was notable for a 5 cm fluctuant Bartholin's cyst on the left.  ER performed incision and drainage. Report states that copious amount of purulent drainage came from the site. Report also states that they did blunt dissection to break up loculations. Report states that the packing material they used was a Word catheter. Report states that they sent did send a wound culture. They did get morphine 4 mg IV in the ER. They prescribed no pain medications when she was discharged from the ER. Their note states  " incision and drainage performed and Word catheter is placed. Advised pt on sitz baths. Advised her to followup with primary care who performs her GYN care in the next week for a checkup." Does not state anything about who is to remove the catheter or when.  Patient states that she was told to followup with her PCP in one week and to follow up again in 4 weeks to have the catheter removed.  Says that she was given no prescription for pain medicines or any type of medication at all when she left the ER. Says that this morning she got dressed and went in to work but then once she was there  realized there was absolutely no way she could stay there secondary to severe pain. 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   TODAY: Today she says that she came in for visit because she felt that she needed followup evaluation prior the to the end of August. Says that the [facility] that [facility]. She says that she is having significant pain at the site. Also, was told to have this catheter removed 4 weeks from the date it was inserted.   Says that by the time that she has the appointment at the end of August, the catheter will have been in way longer than it was supposed to be.  Also she feels very uncomfortable and does not want to continue to deal with this discomfort all the way until the end of August either.   Home Meds:   Outpatient Prescriptions Prior to Visit  Medication Sig Dispense Refill  . ALPRAZolam (XANAX) 0.5 MG tablet Take 0.5 mg by mouth daily as needed for anxiety.      . diphenhydrAMINE (BENADRYL) 25 mg capsule Take 1 capsule (25 mg total) by mouth every 8 (eight) hours as needed for allergies.  30 capsule  0  . norgestimate-ethinyl estradiol (ORTHO-CYCLEN,SPRINTEC,PREVIFEM) 0.25-35 MG-MCG tablet  Take 1 tablet by mouth daily.  1 Package  11  . sertraline (ZOLOFT) 50 MG tablet Take 50 mg by mouth daily.      . famotidine (PEPCID) 20 MG tablet Take 1 tablet (20 mg total) by mouth once.  30 tablet  0  . HYDROcodone-acetaminophen (NORCO/VICODIN) 5-325 MG per tablet Take 2 tablets by mouth every 4 (four) hours as needed for moderate pain.       No facility-administered medications prior to visit.    Allergies: No Known Allergies    Review of Systems: See HPI for pertinent ROS. All other ROS negative.    Physical Exam: Blood pressure 108/76, pulse 76, temperature 97.2 F (36.2 C), temperature source Oral, resp. rate 18, weight 125 lb (56.7 kg), last  menstrual period 10/04/2013., Body mass index is 22.86 kg/(m^2). General: WNWD WF.  Appears in no acute distress. However, she does appear to be in some pain so I did minimal exam, with her standing.  Catheter tube is visualized, coming down, out of left vulva area.  Left vulva is swollen and enlarged compared to the right.  Soft and not firm.  She is wearing a pad in her underwear--says has been having bloody, purulent drainage.  Neck: Supple. No thyromegaly. No lymphadenopathy. Lungs: Clear bilaterally to auscultation without wheezes, rales, or rhonchi. Breathing is unlabored. Heart: Regular rhythm. No murmurs, rubs, or gallops. Msk:  Strength and tone normal for age. Extremities/Skin: Warm and dry. Neuro: Alert and oriented X 3. Moves all extremities spontaneously. Gait is normal. CNII-XII grossly in tact. Psych:  Responds to questions appropriately with a normal affect.     ASSESSMENT AND PLAN:  28 y.o. year old female with    1. Infected cyst of Bartholin's gland duct - traMADol (ULTRAM) 50 MG tablet; Take 1 tablet (50 mg total) by mouth every 8 (eight) hours as needed.  Dispense: 60 tablet; Refill: 0 - Ambulatory referral to Gynecology  I have placed order for a referral to GYN as current appointment is at the end of August and she needs to see a GYN prior to then. Will give her tramadol to use for pain in the interim.    [address] [place], [place], [facility] 11/09/2013 1:33 PM

## 2013-11-10 LAB — CULTURE, GROUP A STREP

## 2013-12-15 ENCOUNTER — Encounter: Payer: Medicaid Other | Admitting: Obstetrics & Gynecology

## 2013-12-19 ENCOUNTER — Ambulatory Visit (INDEPENDENT_AMBULATORY_CARE_PROVIDER_SITE_OTHER): Payer: Medicaid Other | Admitting: Family Medicine

## 2013-12-19 ENCOUNTER — Encounter: Payer: Self-pay | Admitting: Family Medicine

## 2013-12-19 VITALS — BP 122/80 | HR 78 | Temp 98.7°F | Resp 12 | Ht 62.0 in | Wt 123.0 lb

## 2013-12-19 DIAGNOSIS — F329 Major depressive disorder, single episode, unspecified: Secondary | ICD-10-CM

## 2013-12-19 DIAGNOSIS — F3289 Other specified depressive episodes: Secondary | ICD-10-CM

## 2013-12-19 DIAGNOSIS — F32A Depression, unspecified: Secondary | ICD-10-CM

## 2013-12-19 MED ORDER — SERTRALINE HCL 50 MG PO TABS: 50.0000 mg | ORAL_TABLET | Freq: Every day | ORAL | Status: DC

## 2013-12-19 MED ORDER — ALPRAZOLAM 0.5 MG PO TABS: 0.5000 mg | ORAL_TABLET | Freq: Every day | ORAL | Status: DC | PRN

## 2013-12-19 NOTE — Progress Notes (Signed)
Subjective:    Patient ID: [name], female    DOB: Jul 09, 1985, 28 y.o.   MRN: [id number]  HPI 10/17/13 Patient is a very pleasant 27 year old white female who presents today complaining of depression and anxiety. Symptoms began to get steadily worse starting in January. At that time her best friend committed suicide. Since that time she developed worsening depression, anhedonia, insomnia, poor appetite, and poor energy. She denies any suicidal ideation. She is having panic attacks multiple times during the week and constant generalized anxiety. She is tried Lexapro in the past but this is caused her side effects. She's also tried Zoloft in the past which seemed for her. She is interested in starting Zoloft.  She is currently in her last year for school and her grades are starting to suffer due to her anxiety. She's also experiencing mild agoraphobia.  At that time, my plan was: 1. Depression Begin Zoloft 50 mg by mouth each bedtime for one week. After one week increase to 75 mg by mouth each bedtime. Recheck in 4 weeks. Also gave patient prescription for Xanax 0.5 mg by mouth every 8 hours when necessary panic attacks or insomnia.  Recheck in 4 weeks or sooner if worse. - sertraline (ZOLOFT) 50 MG tablet; Take 1.5 tablets (75 mg total) by mouth daily.  Dispense: 60 tablet; Refill: 3 - ALPRAZolam (XANAX) 0.5 MG tablet; Take 1 tablet (0.5 mg total) by mouth 3 (three) times daily as needed for anxiety.  Dispense: 30 tablet; Refill: 0  12/19/13 Patient is doing much better. Agoraphobia has improved. She is performing better in school. Her anxiety level is better. She is on requiring Xanax one or 2 times a week.  She continues to have problems with insomnia but overall is doing well.  She denies any anhedonia or suicidal ideation Past Medical History  Diagnosis Date  . Bartholin cyst     LEFT  . Cervical dysplasia   . Depression    Current Outpatient Prescriptions on File Prior to Visit    Medication Sig Dispense Refill  . diphenhydrAMINE (BENADRYL) 25 mg capsule Take 1 capsule (25 mg total) by mouth every 8 (eight) hours as needed for allergies.  30 capsule  0  . famotidine (PEPCID) 20 MG tablet Take 1 tablet (20 mg total) by mouth once.  30 tablet  0  . HYDROcodone-acetaminophen (NORCO/VICODIN) 5-325 MG per tablet Take 2 tablets by mouth every 4 (four) hours as needed for moderate pain.      . norgestimate-ethinyl estradiol (ORTHO-CYCLEN,SPRINTEC,PREVIFEM) 0.25-35 MG-MCG tablet Take 1 tablet by mouth daily.  1 Package  11  . traMADol (ULTRAM) 50 MG tablet Take 1 tablet (50 mg total) by mouth every 8 (eight) hours as needed.  60 tablet  0   No current facility-administered medications on file prior to visit.   No Known Allergies History   Social History  . Marital Status: Single    Spouse Name: N/A    Number of Children: N/A  . Years of Education: N/A   Occupational History  . Not on file.   Social History Main Topics  . Smoking status: Former Games developer  . Smokeless tobacco: Never Used  . Alcohol Use: Yes     Comment: occasional  . Drug Use: No  . Sexual Activity: Yes    Birth Control/ Protection: Pill   Other Topics Concern  . Not on file   Social History Narrative  . No narrative on file  Review of Systems  All other systems reviewed and are negative.      Objective:   Physical Exam  Vitals reviewed. Cardiovascular: Normal rate and regular rhythm.   Pulmonary/Chest: Effort normal and breath sounds normal.  Psychiatric: She has a normal mood and affect. Her behavior is normal. Judgment and thought content normal.          Assessment & Plan:  Depression  continue Zoloft 75 mg by mouth each bedtime. She can continue to use Xanax sparingly for anxiety. I recommended she try Unisom over-the-counter to help her sleep. Recheck in 6-12 months and consider weaning patient off SSRI in 6-12 months.

## 2014-01-18 ENCOUNTER — Encounter: Payer: Self-pay | Admitting: Physician Assistant

## 2014-01-18 ENCOUNTER — Ambulatory Visit (INDEPENDENT_AMBULATORY_CARE_PROVIDER_SITE_OTHER): Payer: Medicaid Other | Admitting: Physician Assistant

## 2014-01-18 VITALS — BP 110/76 | HR 80 | Temp 98.6°F | Resp 18 | Ht 62.75 in | Wt 126.0 lb

## 2014-01-18 DIAGNOSIS — Z3041 Encounter for surveillance of contraceptive pills: Secondary | ICD-10-CM

## 2014-01-18 DIAGNOSIS — Z8742 Personal history of other diseases of the female genital tract: Secondary | ICD-10-CM

## 2014-01-18 DIAGNOSIS — Z309 Encounter for contraceptive management, unspecified: Secondary | ICD-10-CM | POA: Insufficient documentation

## 2014-01-18 MED ORDER — NORGESTIMATE-ETH ESTRADIOL 0.25-35 MG-MCG PO TABS: 1.0000 | ORAL_TABLET | Freq: Every day | ORAL | Status: DC

## 2014-01-18 NOTE — Progress Notes (Signed)
Subjective:    Patient ID: Oswaldo MilianKelly N Vayda, female    DOB: 03/20/1986, 28 y.o.   MRN: [id number]  HPI  Patient is a 28 year old white female who comes in today for a refill of her birth control. Her past medical history is significant for HSIL on Pap smear in 2012. She underwent LEEP procedure. She had Pap smears every 6 months with 3 normal readings.  Dr. Tanya NonesPickard performed a Pap smear in 2013 and it was normal with no cancerous changes. She had f/u OV with Dr. Tanya NonesPickard 02/2013. At that visit he recommended repeat Pap smear but she  did not want a Pap smear at that OV. As well, she stated that she preferred to have female provider. Do her pap smears in future.  Therefore she returns today to have her GYN exam and Pap smear with me and is going to return on a later date to have the rest of her complete physical exam with Dr. Tanya NonesPickard.  She states that she was to continue her current oral contraceptive pill. She says this is working very well for her. She is having no abnormal bleeding. No dyspareunia. The remainder of her review of systems is negative. Overall she is doing extremely well.  Past Medical History  Diagnosis Date  . Bartholin cyst     LEFT  . Cervical dysplasia   . Depression    Past Surgical History  Procedure Laterality Date  . Cesarean section      '05; '07  . Cholecystectomy  2011  . Cervical biopsy  w/ loop electrode excision  2012  . Orif clavicular fracture  06/10/2011    Procedure: OPEN REDUCTION INTERNAL FIXATION (ORIF) CLAVICULAR FRACTURE;  Surgeon: Cammy CopaGregory Scott Dean, MD;  [facility];  Service: Orthopedics;  Laterality: Right;  ORIF Right clavicle   Outpatient Prescriptions Prior to Visit  Medication Sig Dispense Refill  . ALPRAZolam (XANAX) 0.5 MG tablet Take 1 tablet (0.5 mg total) by mouth daily as needed for anxiety.  30 tablet  0  . diphenhydrAMINE (BENADRYL) 25 mg capsule Take 1 capsule (25 mg total) by mouth every 8 (eight) hours as needed for allergies.   30 capsule  0  . norgestimate-ethinyl estradiol (ORTHO-CYCLEN,SPRINTEC,PREVIFEM) 0.25-35 MG-MCG tablet Take 1 tablet by mouth daily.  1 Package  11  . sertraline (ZOLOFT) 50 MG tablet Take 1 tablet (50 mg total) by mouth daily.  90 tablet  3  . famotidine (PEPCID) 20 MG tablet Take 1 tablet (20 mg total) by mouth once.  30 tablet  0  . HYDROcodone-acetaminophen (NORCO/VICODIN) 5-325 MG per tablet Take 2 tablets by mouth every 4 (four) hours as needed for moderate pain.      . traMADol (ULTRAM) 50 MG tablet Take 1 tablet (50 mg total) by mouth every 8 (eight) hours as needed.  60 tablet  0   No facility-administered medications prior to visit.    No Known Allergies History   Social History  . Marital Status: Single    Spouse Name: N/A    Number of Children: N/A  . Years of Education: N/A   Occupational History  . Not on file.   Social History Main Topics  . Smoking status: Former Games developermoker  . Smokeless tobacco: Never Used  . Alcohol Use: Yes     Comment: occasional  . Drug Use: No  . Sexual Activity: Yes    Birth Control/ Protection: Pill   Other Topics Concern  . Not on file  Social History Narrative  . No narrative on file   patient has 2 children age 8 and 73.  Family History  Problem Relation Age of Onset  . Cancer Paternal Grandmother     CERVICAL  . Diabetes Father     Review of Systems  All other systems reviewed and are negative.       Objective:   Physical Exam  Vitals reviewed. Constitutional: She is oriented to person, place, and time. She appears well-developed and well-nourished. No distress.   Neck: Normal range of motion. Neck supple. No JVD present. No tracheal deviation present. No thyromegaly present.  Cardiovascular: Normal rate, regular rhythm, normal heart sounds and intact distal pulses.  Exam reveals no gallop and no friction rub.   No murmur heard. Pulmonary/Chest: Effort normal and breath sounds normal. No stridor. No respiratory  distress. She has no wheezes. She has no rales. She exhibits no tenderness.  Abdominal: Soft. Bowel sounds are normal. She exhibits no distension and no mass. There is no tenderness. There is no rebound and no guarding.  Musculoskeletal: Normal range of motion. She exhibits no edema and no tenderness.  Lymphadenopathy:    She has no cervical adenopathy.  Breast Exam: Breasts are symmetrical bilaterally. They are soft throughout with no masses.  No nipple discharge.  No skin changes.  Pelvic Exam: External genitalia normal. Vaginal mucosa normal. Cervix normal.  Bimanual exam is normal with no cervical motion tenderness, no adnexal mass, uterus normal size.  Neurological: She is alert and oriented to person, place, and time. She has normal reflexes. She displays normal reflexes. No cranial nerve deficit. She exhibits normal muscle tone. Coordination normal.  Skin: Skin is warm. No rash noted. She is not diaphoretic. No erythema. No pallor.  Psychiatric: She has a normal mood and affect. Her behavior is normal. Judgment and thought content normal.         Assessment & Plan:   1. Encounter for surveillance of contraceptive pills - norgestimate-ethinyl estradiol (ORTHO-CYCLEN,SPRINTEC,PREVIFEM) 0.25-35 MG-MCG tablet; Take 1 tablet by mouth daily.  Dispense: 1 Package; Refill: 11  2. Hx of abnormal cervical Pap smear - PAP, Thin Prep w/HPV rflx HPV Type 16/18  Followup office visit in one year for repeat GYN exam and Pap smear.

## 2014-01-20 LAB — PAP, THIN PREP W/HPV RFLX HPV TYPE 16/18: HPV DNA HIGH RISK: DETECTED — AB

## 2014-01-20 LAB — HPV TYPE 16/18
HPV GENOTYPE, 16: NOT DETECTED
HPV GENOTYPE, 18: NOT DETECTED

## 2014-01-24 ENCOUNTER — Telehealth: Payer: Self-pay | Admitting: Family Medicine

## 2014-01-24 DIAGNOSIS — R87619 Unspecified abnormal cytological findings in specimens from cervix uteri: Secondary | ICD-10-CM

## 2014-01-24 NOTE — Telephone Encounter (Signed)
Message copied by Donne AnonPLUMMER, Krystine Pabst M on Tue Jan 24, 2014  4:55 PM ------      Message from: Allayne ButcherIXON, MARY      Created: Sat Jan 21, 2014  8:21 AM       Tell patient Pap smear is abnormal with some abnormal cells and also high risk HPV.      She needs followup with gynecologist.      Place order for referral to gynecology. ------

## 2014-01-24 NOTE — Telephone Encounter (Signed)
Pt aware of abnormal PAP smear and referral to GYN initiated

## 2014-02-02 ENCOUNTER — Telehealth: Payer: Self-pay | Admitting: Family Medicine

## 2014-02-02 NOTE — Telephone Encounter (Signed)
Contacted pt and she is calling checking on her referral to gynocologist and I told her referral has been place to [facility] and she should be hearing from them regarding appt

## 2014-02-02 NOTE — Telephone Encounter (Signed)
Patient is calling to check on her gynecology referral  Please call her back at [phone]

## 2014-02-09 ENCOUNTER — Telehealth: Payer: Self-pay | Admitting: *Deleted

## 2014-02-09 NOTE — Telephone Encounter (Signed)
Pt called back and stated she was aware of [facility].they had called and spoke with her and gave appointment

## 2014-02-09 NOTE — Telephone Encounter (Signed)
Pt has [facility] on Oct 28 with Dr. Ar[facility]plan at [facility], [facility] on both home and cell numbers

## 2014-02-15 ENCOUNTER — Other Ambulatory Visit: Payer: Self-pay

## 2014-03-13 ENCOUNTER — Encounter: Payer: Self-pay | Admitting: Family Medicine

## 2014-03-13 ENCOUNTER — Ambulatory Visit (INDEPENDENT_AMBULATORY_CARE_PROVIDER_SITE_OTHER): Payer: Medicaid Other | Admitting: Family Medicine

## 2014-03-13 VITALS — BP 110/74 | HR 74 | Temp 98.3°F | Resp 14 | Ht 62.0 in | Wt 130.0 lb

## 2014-03-13 DIAGNOSIS — F329 Major depressive disorder, single episode, unspecified: Secondary | ICD-10-CM

## 2014-03-13 DIAGNOSIS — F32A Depression, unspecified: Secondary | ICD-10-CM

## 2014-03-13 MED ORDER — EFFEXOR XR 75 MG PO CP24: 150.0000 mg | ORAL_CAPSULE | Freq: Every day | ORAL | Status: DC

## 2014-03-13 MED ORDER — ZOLPIDEM TARTRATE 10 MG PO TABS: 10.0000 mg | ORAL_TABLET | Freq: Every evening | ORAL | Status: DC | PRN

## 2014-03-13 NOTE — Progress Notes (Signed)
Subjective:    Patient ID: Claudia MilianKelly N Cousins, female    DOB: 05/24/1985, 28 y.o.   MRN: [id number]  HPI I saw the patient at the end of August for depression and started her on Zoloft. We have increased her Zoloft dose to 100 mg a day. Unfortunately she continues to battle and suffer from depression. Her biggest concern is anhedonia and lack of energy. She is also battling anxiety. She is also battling insomnia. She denies any suicidal ideation or homicidal ideation. She denies any delusions or hallucinations. She feels like the Zoloft is helping only approximately 30%. She uses Xanax occasionally at night to help her sleep however this leaves her feeling too sedated the next day. Past Medical History  Diagnosis Date  . Bartholin cyst     LEFT  . Cervical dysplasia   . Depression    Past Surgical History  Procedure Laterality Date  . Cesarean section      '05; '07  . Cholecystectomy  2011  . Cervical biopsy  w/ loop electrode excision  2012  . Orif clavicular fracture  06/10/2011    Procedure: OPEN REDUCTION INTERNAL FIXATION (ORIF) CLAVICULAR FRACTURE;  Surgeon: Cammy CopaGregory Scott Dean, MD;  [facility];  Service: Orthopedics;  Laterality: Right;  ORIF Right clavicle   Current Outpatient Prescriptions on File Prior to Visit  Medication Sig Dispense Refill  . ALPRAZolam (XANAX) 0.5 MG tablet Take 1 tablet (0.5 mg total) by mouth daily as needed for anxiety. 30 tablet 0  . diphenhydrAMINE (BENADRYL) 25 mg capsule Take 1 capsule (25 mg total) by mouth every 8 (eight) hours as needed for allergies. 30 capsule 0  . norgestimate-ethinyl estradiol (ORTHO-CYCLEN,SPRINTEC,PREVIFEM) 0.25-35 MG-MCG tablet Take 1 tablet by mouth daily. 1 Package 11  . sertraline (ZOLOFT) 50 MG tablet Take 1 tablet (50 mg total) by mouth daily. (Patient taking differently: Take 100 mg by mouth daily. ) 90 tablet 3   No current facility-administered medications on file prior to visit.   No Known Allergies History    Social History  . Marital Status: Single    Spouse Name: N/A    Number of Children: N/A  . Years of Education: N/A   Occupational History  . Not on file.   Social History Main Topics  . Smoking status: Former Games developermoker  . Smokeless tobacco: Never Used  . Alcohol Use: Yes     Comment: occasional  . Drug Use: No  . Sexual Activity: Yes    Birth Control/ Protection: Pill   Other Topics Concern  . Not on file   Social History Narrative      Review of Systems  All other systems reviewed and are negative.      Objective:   Physical Exam  Cardiovascular: Normal rate, regular rhythm and normal heart sounds.   Pulmonary/Chest: Effort normal and breath sounds normal. No respiratory distress. She has no wheezes. She has no rales.  Psychiatric: She has a normal mood and affect. Her behavior is normal. Judgment and thought content normal.  Vitals reviewed.         Assessment & Plan:  Depression - Plan: EFFEXOR XR 75 MG 24 hr capsule, zolpidem (AMBIEN) 10 MG tablet  I will have the patient decrease Zoloft to 50 mg a day for 1 week and then discontinue the medication altogether. Meanwhile I'll have the patient start Effexor XR 75 mg by mouth every morning and in one week increase to 150 mg every morning. Recheck in 1  month. I did give the patient prescription for Ambien 10 mg by mouth daily at bedtime when necessary insomnia to use sparingly hopefully until the Effexor takes effect.

## 2014-03-29 ENCOUNTER — Encounter (HOSPITAL_COMMUNITY): Payer: Self-pay | Admitting: Emergency Medicine

## 2014-03-29 ENCOUNTER — Emergency Department (HOSPITAL_COMMUNITY): Payer: Medicaid Other

## 2014-03-29 ENCOUNTER — Observation Stay (HOSPITAL_COMMUNITY)
Admission: EM | Admit: 2014-03-29 | Discharge: 2014-03-31 | Payer: Medicaid Other | Attending: Internal Medicine | Admitting: Internal Medicine

## 2014-03-29 DIAGNOSIS — T424X2A Poisoning by benzodiazepines, intentional self-harm, initial encounter: Secondary | ICD-10-CM | POA: Insufficient documentation

## 2014-03-29 DIAGNOSIS — G4700 Insomnia, unspecified: Secondary | ICD-10-CM | POA: Diagnosis not present

## 2014-03-29 DIAGNOSIS — Z639 Problem related to primary support group, unspecified: Secondary | ICD-10-CM | POA: Insufficient documentation

## 2014-03-29 DIAGNOSIS — T1491XA Suicide attempt, initial encounter: Secondary | ICD-10-CM

## 2014-03-29 DIAGNOSIS — F32A Depression, unspecified: Secondary | ICD-10-CM | POA: Diagnosis present

## 2014-03-29 DIAGNOSIS — Y9289 Other specified places as the place of occurrence of the external cause: Secondary | ICD-10-CM | POA: Diagnosis not present

## 2014-03-29 DIAGNOSIS — F411 Generalized anxiety disorder: Secondary | ICD-10-CM | POA: Diagnosis not present

## 2014-03-29 DIAGNOSIS — T426X2A Poisoning by other antiepileptic and sedative-hypnotic drugs, intentional self-harm, initial encounter: Secondary | ICD-10-CM | POA: Insufficient documentation

## 2014-03-29 DIAGNOSIS — X58XXXA Exposure to other specified factors, initial encounter: Secondary | ICD-10-CM | POA: Insufficient documentation

## 2014-03-29 DIAGNOSIS — Z609 Problem related to social environment, unspecified: Secondary | ICD-10-CM | POA: Diagnosis not present

## 2014-03-29 DIAGNOSIS — T50902A Poisoning by unspecified drugs, medicaments and biological substances, intentional self-harm, initial encounter: Secondary | ICD-10-CM | POA: Diagnosis present

## 2014-03-29 DIAGNOSIS — T1491 Suicide attempt: Principal | ICD-10-CM | POA: Insufficient documentation

## 2014-03-29 DIAGNOSIS — S0990XA Unspecified injury of head, initial encounter: Secondary | ICD-10-CM | POA: Diagnosis present

## 2014-03-29 DIAGNOSIS — T50901A Poisoning by unspecified drugs, medicaments and biological substances, accidental (unintentional), initial encounter: Secondary | ICD-10-CM

## 2014-03-29 DIAGNOSIS — S0181XA Laceration without foreign body of other part of head, initial encounter: Secondary | ICD-10-CM | POA: Insufficient documentation

## 2014-03-29 DIAGNOSIS — F332 Major depressive disorder, recurrent severe without psychotic features: Secondary | ICD-10-CM | POA: Insufficient documentation

## 2014-03-29 DIAGNOSIS — R001 Bradycardia, unspecified: Secondary | ICD-10-CM | POA: Insufficient documentation

## 2014-03-29 DIAGNOSIS — F329 Major depressive disorder, single episode, unspecified: Secondary | ICD-10-CM | POA: Diagnosis present

## 2014-03-29 DIAGNOSIS — T424X1A Poisoning by benzodiazepines, accidental (unintentional), initial encounter: Secondary | ICD-10-CM | POA: Diagnosis present

## 2014-03-29 LAB — RAPID URINE DRUG SCREEN, HOSP PERFORMED
Amphetamines: NOT DETECTED
BARBITURATES: NOT DETECTED
Benzodiazepines: POSITIVE — AB
Cocaine: NOT DETECTED
OPIATES: NOT DETECTED
Tetrahydrocannabinol: NOT DETECTED

## 2014-03-29 LAB — COMPREHENSIVE METABOLIC PANEL
ALBUMIN: 3.6 g/dL (ref 3.5–5.2)
ALT: 20 U/L (ref 0–35)
AST: 22 U/L (ref 0–37)
Alkaline Phosphatase: 71 U/L (ref 39–117)
Anion gap: 16 — ABNORMAL HIGH (ref 5–15)
BUN: 12 mg/dL (ref 6–23)
CHLORIDE: 101 meq/L (ref 96–112)
CO2: 21 meq/L (ref 19–32)
Calcium: 8.9 mg/dL (ref 8.4–10.5)
Creatinine, Ser: 0.61 mg/dL (ref 0.50–1.10)
GFR calc Af Amer: >90 mL/min (ref >90)
GFR calc non Af Amer: >90 mL/min (ref >90)
Glucose, Bld: 82 mg/dL (ref 70–99)
POTASSIUM: 3.7 meq/L (ref 3.7–5.3)
SODIUM: 138 meq/L (ref 137–147)
Total Bilirubin: 0.9 mg/dL (ref 0.3–1.2)
Total Protein: 7.3 g/dL (ref 6.0–8.3)

## 2014-03-29 LAB — CBC
HCT: 34.5 % — ABNORMAL LOW (ref 36.0–46.0)
Hemoglobin: 11.2 g/dL — ABNORMAL LOW (ref 12.0–15.0)
MCH: 30 pg (ref 26.0–34.0)
MCHC: 32.5 g/dL (ref 30.0–36.0)
MCV: 92.5 fL (ref 78.0–100.0)
Platelets: 259 10*3/uL (ref 150–400)
RBC: 3.73 MIL/uL — AB (ref 3.87–5.11)
RDW: 12.5 % (ref 11.5–15.5)
WBC: 8.7 10*3/uL (ref 4.0–10.5)

## 2014-03-29 LAB — ACETAMINOPHEN LEVEL

## 2014-03-29 LAB — PREGNANCY, URINE: Preg Test, Ur: NEGATIVE

## 2014-03-29 LAB — SALICYLATE LEVEL: Salicylate Lvl: <2 mg/dL — ABNORMAL LOW (ref 2.8–20.0)

## 2014-03-29 LAB — ETHANOL

## 2014-03-29 MED ORDER — LIDOCAINE-EPINEPHRINE 2 %-1:100000 IJ SOLN: 20.0000 mL | Freq: Once | INTRAMUSCULAR | Status: DC

## 2014-03-29 MED ORDER — ONDANSETRON HCL 4 MG/2ML IJ SOLN
4.0000 mg | Freq: Once | INTRAMUSCULAR | Status: AC
  Administered 2014-03-29: 4 mg via INTRAVENOUS
  Filled 2014-03-29: qty 2

## 2014-03-29 MED ORDER — SODIUM CHLORIDE 0.9 % IV BOLUS (SEPSIS)
1000.0000 mL | Freq: Once | INTRAVENOUS | Status: AC
  Administered 2014-03-29: 1000 mL via INTRAVENOUS

## 2014-03-29 MED ORDER — LIDOCAINE HCL 1 % IJ SOLN
INTRAMUSCULAR | Status: AC
  Filled 2014-03-29: qty 20

## 2014-03-29 MED ORDER — LIDOCAINE-EPINEPHRINE (PF) 2 %-1:200000 IJ SOLN: 10.0000 mL | Freq: Once | INTRAMUSCULAR | Status: DC

## 2014-03-29 MED ORDER — TETANUS-DIPHTH-ACELL PERTUSSIS 5-2.5-18.5 LF-MCG/0.5 IM SUSP
0.5000 mL | Freq: Once | INTRAMUSCULAR | Status: AC
  Administered 2014-03-29: 0.5 mL via INTRAMUSCULAR
  Filled 2014-03-29: qty 0.5

## 2014-03-29 NOTE — ED Notes (Signed)
Pt found unresponsive at her home. After EMS arroused pt they found two empty pill bottles beside her. She states she took 15 Ambien and 12 Xanax at around 17:00 today, and that she drank a glass of wine with the pills. Pt complains of stomach aching, nauseous. Vitals stable at this time, alert, lethargic

## 2014-03-29 NOTE — H&P (Signed)
PCP:  Leo GrosserPICKARD,WARREN TOM, MD    Chief Complaint:   suicide attemp with xanax and Ambien overdose.   HPI: [name] is a 28 y.o. female   has a past medical history of Bartholin cyst; Cervical dysplasia; and Depression.   Presented with  Patient not sure what was the trigger. States she has hx of depression but never attempted suicide in the past. She does endorse history of self-harm since high school. Has not been followed by psychiatry. Patient reports taking 12 tabs of Ambien 10 mg each and 8 tabs of Xanax all that she had available. She also drank 1 glass of wine. Un sure while she was trying to hurt self but confirms attempt at self harm.  patient has laceration to her forehead but unsure how that happened.  ER M.D. had contacted [facility] who recommended observation overnight. CT scan of the head was unremarkable except for right frontal scalp hematoma and laceration Hospitalist was called for admission for oversedation due to Xanax and Ambien overdose.   Review of Systems:    Pertinent positives include:   headaches,  Constitutional:  No weight loss, night sweats, Fevers, chills, fatigue, weight loss  HEENT:  No Difficulty swallowing,Tooth/dental problems,Sore throat,  No sneezing, itching, ear ache, nasal congestion, post nasal drip,  Cardio-vascular:  No chest pain, Orthopnea, PND, anasarca, dizziness, palpitations.no Bilateral lower extremity swelling  GI:  No heartburn, indigestion, abdominal pain, nausea, vomiting, diarrhea, change in bowel habits, loss of appetite, melena, blood in stool, hematemesis Resp:  no shortness of breath at rest. No dyspnea on exertion, No excess mucus, no productive cough, No non-productive cough, No coughing up of blood.No change in color of mucus.No wheezing. Skin:  no rash or lesions. No jaundice GU:  no dysuria, change in color of urine, no urgency or frequency. No straining to urinate.  No flank pain.    Musculoskeletal:  No joint pain or no joint swelling. No decreased range of motion. No back pain.  Psych:  No change in mood or affect. No depression or anxiety. No memory loss.  Neuro: no localizing neurological complaints, no tingling, no weakness, no double vision, no gait abnormality, no slurred speech, no confusion  Otherwise ROS are negative except for above, 10 systems were reviewed  Past Medical History: Past Medical History  Diagnosis Date  . Bartholin cyst     LEFT  . Cervical dysplasia   . Depression    Past Surgical History  Procedure Laterality Date  . Cesarean section      '05; '07  . Cholecystectomy  2011  . Cervical biopsy  w/ loop electrode excision  2012  . Orif clavicular fracture  06/10/2011    Procedure: OPEN REDUCTION INTERNAL FIXATION (ORIF) CLAVICULAR FRACTURE;  Surgeon: Cammy CopaGregory Scott Dean, MD;  [facility];  Service: Orthopedics;  Laterality: Right;  ORIF Right clavicle     Medications: Prior to Admission medications   Medication Sig Start Date End Date Taking? Authorizing Provider  ALPRAZolam Prudy Feeler(XANAX) 0.5 MG tablet Take 1 tablet (0.5 mg total) by mouth daily as needed for anxiety. 12/19/13  Yes Donita BrooksWarren T Pickard, MD  diphenhydrAMINE (BENADRYL) 25 mg capsule Take 1 capsule (25 mg total) by mouth every 8 (eight) hours as needed for allergies. 11/08/13  Yes Shon Batonourtney F Horton, MD  EFFEXOR XR 75 MG 24 hr capsule Take 2 capsules (150 mg total) by mouth daily with breakfast. 03/13/14  Yes Donita BrooksWarren T Pickard, MD  ibuprofen (ADVIL,MOTRIN) 200  MG tablet Take 400 mg by mouth every 6 (six) hours as needed for moderate pain.   Yes Historical Provider, MD  zolpidem (AMBIEN) 10 MG tablet Take 1 tablet (10 mg total) by mouth at bedtime as needed for sleep. 03/13/14 04/12/14 Yes Donita Brooks, MD  norgestimate-ethinyl estradiol (ORTHO-CYCLEN,SPRINTEC,PREVIFEM) 0.25-35 MG-MCG tablet Take 1 tablet by mouth daily. 01/18/14   Patriciaann Clan Dixon, PA-C  sertraline (ZOLOFT) 50 MG  tablet Take 1 tablet (50 mg total) by mouth daily. 12/19/13   Donita Brooks, MD    Allergies:  No Known Allergies  Social History:  Ambulatory  Independently  Lives at home alone,     Patient has a 31 year old child who lives with her parents.    reports that she has quit smoking. She has never used smokeless tobacco. She reports that she drinks alcohol. She reports that she does not use illicit drugs.    Family History: family history includes Cancer in her paternal grandmother; Diabetes in her father.    Physical Exam: Patient Vitals for the past 24 hrs:  BP Temp Temp src Pulse Resp SpO2  03/29/14 2326 - - - - - 99 %  03/29/14 2206 106/66 mmHg - - 80 14 96 %  03/29/14 2105 119/78 mmHg - - 72 22 97 %  03/29/14 2031 120/86 mmHg 98.5 F (36.9 C) Oral 76 23 98 %  03/29/14 1833 133/83 mmHg - Oral 80 20 100 %    1. General:  in No Acute distress 2. Psychological: Somnolent but  Oriented 3. Head/ENT:   Moist Mucous Membranes                           Head traumatic, laceration over right brow, neck supple                          Normal   Dentition 4. SKIN:  decreased Skin turgor,  Skin patient is covered in dried blood, numerous cutting marks in various stages of healing over bilateral wrists. Laceration over right brow 5. Heart: Regular rate and rhythm no Murmur, Rub or gallop 6. Lungs: Clear to auscultation bilaterally, no wheezes or crackles   7. Abdomen: Soft, non-tender, Non distended 8. Lower extremities: no clubbing, cyanosis, or edema 9. Neurologically Grossly intact, moving all 4 extremities equally 10. MSK: Normal range of motion  body mass index is unknown because there is no weight on file.   Labs on Admission:   Results for orders placed or performed during the hospital encounter of 03/29/14 (from the past 24 hour(s))  CBC     Status: Abnormal   Collection Time: 03/29/14  7:11 PM  Result Value Ref Range   WBC 8.7 4.0 - 10.5 K/uL   RBC 3.73 (L) 3.87 -  5.11 MIL/uL   Hemoglobin 11.2 (L) 12.0 - 15.0 g/dL   HCT 40.9 (L) 81.1 - 91.4 %   MCV 92.5 78.0 - 100.0 fL   MCH 30.0 26.0 - 34.0 pg   MCHC 32.5 30.0 - 36.0 g/dL   RDW 78.2 95.6 - 21.3 %   Platelets 259 150 - 400 K/uL  Comprehensive metabolic panel     Status: Abnormal   Collection Time: 03/29/14  7:11 PM  Result Value Ref Range   Sodium 138 137 - 147 mEq/L   Potassium 3.7 3.7 - 5.3 mEq/L   Chloride 101 96 - 112 mEq/L  CO2 21 19 - 32 mEq/L   Glucose, Bld 82 70 - 99 mg/dL   BUN 12 6 - 23 mg/dL   Creatinine, Ser 4.090.61 0.50 - 1.10 mg/dL   Calcium 8.9 8.4 - 81.110.5 mg/dL   Total Protein 7.3 6.0 - 8.3 g/dL   Albumin 3.6 3.5 - 5.2 g/dL   AST 22 0 - 37 U/L   ALT 20 0 - 35 U/L   Alkaline Phosphatase 71 39 - 117 U/L   Total Bilirubin 0.9 0.3 - 1.2 mg/dL   GFR calc non Af Amer >90 >90 mL/min   GFR calc Af Amer >90 >90 mL/min   Anion gap 16 (H) 5 - 15  Ethanol (ETOH)     Status: None   Collection Time: 03/29/14  7:11 PM  Result Value Ref Range   Alcohol, Ethyl (B) <11 0 - 11 mg/dL  Acetaminophen level     Status: None   Collection Time: 03/29/14  7:11 PM  Result Value Ref Range   Acetaminophen (Tylenol), Serum <15.0 10 - 30 ug/mL  Salicylate level     Status: Abnormal   Collection Time: 03/29/14  7:11 PM  Result Value Ref Range   Salicylate Lvl <2.0 (L) 2.8 - 20.0 mg/dL  Urine rapid drug screen (hosp performed)     Status: Abnormal   Collection Time: 03/29/14  8:04 PM  Result Value Ref Range   Opiates NONE DETECTED NONE DETECTED   Cocaine NONE DETECTED NONE DETECTED   Benzodiazepines POSITIVE (A) NONE DETECTED   Amphetamines NONE DETECTED NONE DETECTED   Tetrahydrocannabinol NONE DETECTED NONE DETECTED   Barbiturates NONE DETECTED NONE DETECTED  Pregnancy, urine (if pre-menopausal female)  MHP     Status: None   Collection Time: 03/29/14  8:04 PM  Result Value Ref Range   Preg Test, Ur NEGATIVE NEGATIVE    UA not obtained  No results found for: HGBA1C  CrCl cannot be  calculated (Unknown ideal weight.).  BNP (last 3 results) No results for input(s): PROBNP in the last 8760 hours.  Other results:  I have pearsonaly reviewed this: ECG REPORT  Rate:84  Rhythm: NSR ST&T Change: no ischemic changes Normal intervals  There were no vitals filed for this visit.   Cultures:    Component Value Date/Time   SDES THROAT 11/08/2013 1141   SPECREQUEST NONE 11/08/2013 1141   CULT  11/08/2013 1141    No Beta Hemolytic Streptococci Isolated Performed at [facility]   REPTSTATUS 11/10/2013 FINAL 11/08/2013 1141     Radiological Exams on Admission: Ct Head Wo Contrast  03/29/2014   CLINICAL DATA:  Found unresponsive at home, drug overdose (Ambien and Xanax), RIGHT frontal abrasion, head injury  EXAM: CT HEAD WITHOUT CONTRAST  TECHNIQUE: Contiguous axial images were obtained from the base of the skull through the vertex without intravenous contrast.  COMPARISON:  None  FINDINGS: Normal ventricular morphology.  No midline shift or mass effect.  Normal appearance of brain parenchyma.  No intracranial hemorrhage, mass lesion or evidence acute infarction.  No extra-axial fluid collections.  Bones and sinuses unremarkable.  Small RIGHT frontal scalp hematoma and laceration.  IMPRESSION: No acute intracranial abnormalities.   Electronically Signed   By: Ulyses SouthwardMark  Boles M.D.   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  Benzodiazepine overdose - vision continues to be somnolent likely secondary to large dose of Ambien, we'll admit for observation on telemetry. Monitor pulse ox. Discussed patient's psychiatry who will see her in the morning. Suicide precautions. Head laceration and injury CT scan of the head unremarkable ER MD will assist in suturing  laceration History of depression - hold off on Effexor and Zoloft to avoid interaction with large dose of benzodiazepines. Psychiatry will see in the morning. Prophylaxis: SCD    CODE STATUS:  FULL CODE    Other plan as per orders.  I have spent a total of 65 min on this admission extra time taken to discuss case with behavioral health  Gwenith Tschida 03/29/2014, 11:39 PM  [facility]  Pager [phone]   after 2 AM please page floor coverage PA If 7AM-7PM, please contact the day team taking care of the patient  [email or web address]  Password [email or web address]

## 2014-03-29 NOTE — ED Provider Notes (Addendum)
CSN: [id number]     Arrival date & time 03/29/14  1836 History   First MD Initiated Contact with Patient 03/29/14 1851     Chief Complaint  Patient presents with  . Drug Overdose     (Consider location/radiation/quality/duration/timing/severity/associated sxs/prior Treatment) HPI Comments: Patient ingested approximately #1510 mg Ambien and approximately #12 0.5 mg Xanax tabs about 2 hours prior to arrival and a suicide attempt.  She has a laceration on her forehead and does not know where it came from.  She reports being depressed since she was 28 years old and states that her depression has worsened recently.  Patient is a [age] y.o. female presenting with Overdose. The history is provided by the patient and the EMS personnel. The history is limited by the condition of the patient. No language interpreter was used.  Drug Overdose The current episode started 1 to 2 hours ago. The problem occurs rarely. The problem has not changed since onset.Pertinent negatives include no chest pain, no abdominal pain, no headaches and no shortness of breath. Associated symptoms comments: Dizziness and fatigue. Nothing aggravates the symptoms. Nothing relieves the symptoms. She has tried nothing for the symptoms. The treatment provided no relief.    Past Medical History  Diagnosis Date  . Bartholin cyst     LEFT  . Cervical dysplasia   . Depression    Past Surgical History  Procedure Laterality Date  . Cesarean section      '05; '07  . Cholecystectomy  2011  . Cervical biopsy  w/ loop electrode excision  2012  . Orif clavicular fracture  06/10/2011    Procedure: OPEN REDUCTION INTERNAL FIXATION (ORIF) CLAVICULAR FRACTURE;  Surgeon: Cammy CopaGregory Scott Dean, MD;  [facility];  Service: Orthopedics;  Laterality: Right;  ORIF Right clavicle   Family History  Problem Relation Age of Onset  . Cancer Paternal Grandmother     CERVICAL  . Diabetes Father    History  Substance Use Topics  . Smoking status:  Former Games developermoker  . Smokeless tobacco: Never Used  . Alcohol Use: Yes     Comment: occasional   OB History    No data available     Review of Systems  Constitutional: Negative for fever, chills, diaphoresis, activity change, appetite change and fatigue.  HENT: Negative for congestion, facial swelling, rhinorrhea and sore throat.   Eyes: Negative for photophobia and discharge.  Respiratory: Negative for cough, chest tightness and shortness of breath.   Cardiovascular: Negative for chest pain, palpitations and leg swelling.  Gastrointestinal: Negative for nausea, vomiting, abdominal pain and diarrhea.  Endocrine: Negative for polydipsia and polyuria.  Genitourinary: Negative for dysuria, frequency, difficulty urinating and pelvic pain.  Musculoskeletal: Negative for back pain, arthralgias, neck pain and neck stiffness.  Skin: Positive for wound. Negative for color change.  Allergic/Immunologic: Negative for immunocompromised state.  Neurological: Negative for facial asymmetry, weakness, numbness and headaches.  Hematological: Does not bruise/bleed easily.  Psychiatric/Behavioral: Negative for confusion and agitation.      Allergies  Review of patient's allergies indicates no known allergies.  Home Medications   Prior to Admission medications   Medication Sig Start Date End Date Taking? Authorizing Provider  ALPRAZolam Prudy Feeler(XANAX) 0.5 MG tablet Take 1 tablet (0.5 mg total) by mouth daily as needed for anxiety. 12/19/13  Yes Donita BrooksWarren T Pickard, MD  diphenhydrAMINE (BENADRYL) 25 mg capsule Take 1 capsule (25 mg total) by mouth every 8 (eight) hours as needed for allergies. 11/08/13  Yes Mayer Maskerourtney F  Horton, MD  EFFEXOR XR 75 MG 24 hr capsule Take 2 capsules (150 mg total) by mouth daily with breakfast. 03/13/14  Yes Donita BrooksWarren T Pickard, MD  ibuprofen (ADVIL,MOTRIN) 200 MG tablet Take 400 mg by mouth every 6 (six) hours as needed for moderate pain.   Yes Historical Provider, MD  zolpidem (AMBIEN) 10  MG tablet Take 1 tablet (10 mg total) by mouth at bedtime as needed for sleep. 03/13/14 04/12/14 Yes Donita BrooksWarren T Pickard, MD  norgestimate-ethinyl estradiol (ORTHO-CYCLEN,SPRINTEC,PREVIFEM) 0.25-35 MG-MCG tablet Take 1 tablet by mouth daily. 01/18/14   Patriciaann ClanMary B Dixon, PA-C  sertraline (ZOLOFT) 50 MG tablet Take 1 tablet (50 mg total) by mouth daily. 12/19/13   Donita BrooksWarren T Pickard, MD   BP 134/89 mmHg  Pulse 69  Temp(Src) 98.2 F (36.8 C) (Oral)  Resp 16  Ht 5\' 2"  (1.575 m)  Wt 127 lb 3.3 oz (57.7 kg)  BMI 23.26 kg/m2  SpO2 100%  LMP 03/29/2014 Physical Exam  Constitutional: She is oriented to person, place, and time. She appears well-developed and well-nourished. No distress.  HENT:  Head: Normocephalic and atraumatic.    Mouth/Throat: No oropharyngeal exudate.  Eyes: Pupils are equal, round, and reactive to light.  Neck: Normal range of motion. Neck supple.  Cardiovascular: Normal rate, regular rhythm and normal heart sounds.  Exam reveals no gallop and no friction rub.   No murmur heard. Pulmonary/Chest: Effort normal and breath sounds normal. No respiratory distress. She has no wheezes. She has no rales.  Abdominal: Soft. Bowel sounds are normal. She exhibits no distension and no mass. There is no tenderness. There is no rebound and no guarding.  Musculoskeletal: Normal range of motion. She exhibits no edema or tenderness.  Neurological: She is alert and oriented to person, place, and time.  Skin: Skin is warm and dry.  Psychiatric: She has a normal mood and affect.    ED Course  LACERATION REPAIR Date/Time: 03/30/2014 1:15 AM Performed by: Toy CookeyHERTY, Dalphine Cowie Authorized by: Toy CookeyHERTY, Kallon Caylor Consent: Verbal consent obtained. Written consent obtained. Risks and benefits: risks, benefits and alternatives were discussed Consent given by: patient Patient understanding: patient states understanding of the procedure being performed Patient consent: the patient's understanding of the procedure  matches consent given Procedure consent: procedure consent matches procedure scheduled Relevant documents: relevant documents present and verified Test results: test results available and properly labeled Site marked: the operative site was marked Imaging studies: imaging studies available Required items: required blood products, implants, devices, and special equipment available Patient identity confirmed: verbally with patient Time out: Immediately prior to procedure a "time out" was called to verify the correct patient, procedure, equipment, support staff and site/side marked as required. Body area: head/neck Location details: right eyebrow Laceration length: 2 cm Anesthesia: local infiltration Local anesthetic: lidocaine 1% with epinephrine Anesthetic total: 1 ml Patient sedated: no Preparation: Patient was prepped and draped in the usual sterile fashion. Irrigation solution: saline Irrigation method: syringe Amount of cleaning: standard Debridement: none Degree of undermining: none Skin closure: 6-0 Prolene Number of sutures: 3 Technique: simple Approximation: close Approximation difficulty: simple Dressing: 4x4 sterile gauze and antibiotic ointment Patient tolerance: Patient tolerated the procedure well with no immediate complications   (including critical care time) Labs Review Labs Reviewed  CBC - Abnormal; Notable for the following:    RBC 3.73 (*)    Hemoglobin 11.2 (*)    HCT 34.5 (*)    All other components within normal limits  COMPREHENSIVE METABOLIC PANEL - Abnormal;  Notable for the following:    Anion gap 16 (*)    All other components within normal limits  SALICYLATE LEVEL - Abnormal; Notable for the following:    Salicylate Lvl <2.0 (*)    All other components within normal limits  URINE RAPID DRUG SCREEN (HOSP PERFORMED) - Abnormal; Notable for the following:    Benzodiazepines POSITIVE (*)    All other components within normal limits  ETHANOL    ACETAMINOPHEN LEVEL  PREGNANCY, URINE  MAGNESIUM  PHOSPHORUS  TSH  COMPREHENSIVE METABOLIC PANEL  CBC    Imaging Review Ct Head Wo Contrast  03/29/2014   CLINICAL DATA:  Found unresponsive at home, drug overdose (Ambien and Xanax), RIGHT frontal abrasion, head injury  EXAM: CT HEAD WITHOUT CONTRAST  TECHNIQUE: Contiguous axial images were obtained from the base of the skull through the vertex without intravenous contrast.  COMPARISON:  None  FINDINGS: Normal ventricular morphology.  No midline shift or mass effect.  Normal appearance of brain parenchyma.  No intracranial hemorrhage, mass lesion or evidence acute infarction.  No extra-axial fluid collections.  Bones and sinuses unremarkable.  Small RIGHT frontal scalp hematoma and laceration.  IMPRESSION: No acute intracranial abnormalities.   Electronically Signed   By: Ulyses Southward M.D.   On: 03/29/2014 20:09     EKG Interpretation   Date/Time:  Wednesday March 29 2014 18:39:54 EST Ventricular Rate:  84 PR Interval:  160 QRS Duration: 71 QT Interval:  381 QTC Calculation: 450 R Axis:   42 Text Interpretation:  Sinus rhythm EKG WITHIN NORMAL LIMITS Confirmed by  Wava Kildow  MD, Breken Nazari ([id number]) on 03/29/2014 7:08:48 PM      MDM   Final diagnoses:  Drug overdose  Head injury  Suicide attempt    Pt is a 28 y.o. female with Pmhx as above who presents with intentional overdose of approximately 15  Ambien at approximately 12 0.5 mg Xanax at about 1700 today with a glass of wine.  She reports this was an intentional suicide attempt.  She reports chronic depression, which is been worse over the past several weeks.  She reports a suicide attempt about 1 month ago by cutting, but states that this "was never a good try."  Has a cut, superficial.  On physical exam, patient is sleepy but oriented 3 and can follow commands without difficulty.  She has a 3 cm vertical linear laceration through right eyebrow.  No other signs of acute trauma on  exam.    Patient's eyebrow laceration was repaired as above.  She was observed for about 5 and half hours in the emergency department and ring remained somnolent.  Her respirations were 12-15.  However, she would wait And converse.  Blood pressures trending down from 130s/ 80s to 110/60s.  She is still somnolent, tried consulted for admission products.  No she has been IVC by myself and will need psychiatric admission after she is cleared.    Toy Cookey, MD 03/30/14 [id number]  Toy Cookey, MD 03/30/14 [phone]

## 2014-03-29 NOTE — ED Notes (Signed)
Bed: [id number] Expected date:  Expected time:  Means of arrival:  Comments: Ems- overdose

## 2014-03-29 NOTE — ED Notes (Addendum)
Poison Control: Resp depression Hypotension Bradycardia  Ambien x4 hours Xanax overnight  EKG Tylenol/liver function Symptomatic/supportive care Fluids w/ hypotension Do not reverse d/t seizures withdrawal  Debra from poison control

## 2014-03-29 NOTE — ED Notes (Signed)
Patient transported to CT 

## 2014-03-30 DIAGNOSIS — T1491XA Suicide attempt, initial encounter: Secondary | ICD-10-CM | POA: Diagnosis present

## 2014-03-30 DIAGNOSIS — R45851 Suicidal ideations: Secondary | ICD-10-CM

## 2014-03-30 DIAGNOSIS — F411 Generalized anxiety disorder: Secondary | ICD-10-CM

## 2014-03-30 DIAGNOSIS — T424X2A Poisoning by benzodiazepines, intentional self-harm, initial encounter: Secondary | ICD-10-CM | POA: Diagnosis not present

## 2014-03-30 DIAGNOSIS — T424X1A Poisoning by benzodiazepines, accidental (unintentional), initial encounter: Secondary | ICD-10-CM | POA: Diagnosis present

## 2014-03-30 DIAGNOSIS — S0181XA Laceration without foreign body of other part of head, initial encounter: Secondary | ICD-10-CM | POA: Diagnosis not present

## 2014-03-30 DIAGNOSIS — F329 Major depressive disorder, single episode, unspecified: Secondary | ICD-10-CM

## 2014-03-30 DIAGNOSIS — F332 Major depressive disorder, recurrent severe without psychotic features: Secondary | ICD-10-CM

## 2014-03-30 DIAGNOSIS — T426X2A Poisoning by other antiepileptic and sedative-hypnotic drugs, intentional self-harm, initial encounter: Secondary | ICD-10-CM | POA: Diagnosis not present

## 2014-03-30 DIAGNOSIS — F32A Depression, unspecified: Secondary | ICD-10-CM | POA: Diagnosis present

## 2014-03-30 DIAGNOSIS — S0990XA Unspecified injury of head, initial encounter: Secondary | ICD-10-CM | POA: Diagnosis present

## 2014-03-30 DIAGNOSIS — T50902D Poisoning by unspecified drugs, medicaments and biological substances, intentional self-harm, subsequent encounter: Secondary | ICD-10-CM

## 2014-03-30 DIAGNOSIS — T1491 Suicide attempt: Secondary | ICD-10-CM | POA: Diagnosis not present

## 2014-03-30 LAB — CBC
HEMATOCRIT: 33.3 % — AB (ref 36.0–46.0)
Hemoglobin: 10.9 g/dL — ABNORMAL LOW (ref 12.0–15.0)
MCH: 30.7 pg (ref 26.0–34.0)
MCHC: 32.7 g/dL (ref 30.0–36.0)
MCV: 93.8 fL (ref 78.0–100.0)
Platelets: 204 10*3/uL (ref 150–400)
RBC: 3.55 MIL/uL — ABNORMAL LOW (ref 3.87–5.11)
RDW: 12.4 % (ref 11.5–15.5)
WBC: 9.3 10*3/uL (ref 4.0–10.5)

## 2014-03-30 LAB — COMPREHENSIVE METABOLIC PANEL
ALBUMIN: 2.9 g/dL — AB (ref 3.5–5.2)
ALT: 16 U/L (ref 0–35)
AST: 18 U/L (ref 0–37)
Alkaline Phosphatase: 77 U/L (ref 39–117)
Anion gap: 12 (ref 5–15)
BILIRUBIN TOTAL: 1.3 mg/dL — AB (ref 0.3–1.2)
BUN: 8 mg/dL (ref 6–23)
CHLORIDE: 105 meq/L (ref 96–112)
CO2: 21 mEq/L (ref 19–32)
Calcium: 8 mg/dL — ABNORMAL LOW (ref 8.4–10.5)
Creatinine, Ser: 0.61 mg/dL (ref 0.50–1.10)
GFR calc Af Amer: >90 mL/min (ref >90)
GFR calc non Af Amer: >90 mL/min (ref >90)
Glucose, Bld: 98 mg/dL (ref 70–99)
POTASSIUM: 3.4 meq/L — AB (ref 3.7–5.3)
Sodium: 138 mEq/L (ref 137–147)
Total Protein: 6 g/dL (ref 6.0–8.3)

## 2014-03-30 LAB — TSH: TSH: 1.8 u[IU]/mL (ref 0.350–4.500)

## 2014-03-30 LAB — PHOSPHORUS: Phosphorus: 2.5 mg/dL (ref 2.3–4.6)

## 2014-03-30 LAB — MAGNESIUM: Magnesium: 1.5 mg/dL (ref 1.5–2.5)

## 2014-03-30 MED ORDER — ONDANSETRON HCL 4 MG/2ML IJ SOLN: 4.0000 mg | Freq: Four times a day (QID) | INTRAMUSCULAR | Status: DC | PRN

## 2014-03-30 MED ORDER — SODIUM CHLORIDE 0.9 % IV SOLN: INTRAVENOUS | Status: AC

## 2014-03-30 MED ORDER — CLONAZEPAM 0.5 MG PO TABS: 0.5000 mg | ORAL_TABLET | Freq: Two times a day (BID) | ORAL | Status: DC | PRN

## 2014-03-30 MED ORDER — SODIUM CHLORIDE 0.9 % IV SOLN
INTRAVENOUS | Status: AC
  Administered 2014-03-30: 01:00:00 via INTRAVENOUS

## 2014-03-30 MED ORDER — ONDANSETRON HCL 4 MG PO TABS: 4.0000 mg | ORAL_TABLET | Freq: Four times a day (QID) | ORAL | Status: DC | PRN

## 2014-03-30 MED ORDER — HYDROCODONE-ACETAMINOPHEN 5-325 MG PO TABS
1.0000 | ORAL_TABLET | ORAL | Status: DC | PRN
  Administered 2014-03-30 – 2014-03-31 (×7): 1 via ORAL
  Filled 2014-03-30 (×7): qty 1

## 2014-03-30 MED ORDER — HYDROXYZINE HCL 50 MG PO TABS
50.0000 mg | ORAL_TABLET | Freq: Every day | ORAL | Status: DC
  Administered 2014-03-30: 50 mg via ORAL
  Filled 2014-03-30 (×2): qty 1

## 2014-03-30 MED ORDER — VENLAFAXINE HCL ER 75 MG PO CP24
75.0000 mg | ORAL_CAPSULE | Freq: Every day | ORAL | Status: DC
  Administered 2014-03-31: 75 mg via ORAL
  Filled 2014-03-30 (×2): qty 1

## 2014-03-30 MED ORDER — SODIUM CHLORIDE 0.9 % IJ SOLN
3.0000 mL | Freq: Two times a day (BID) | INTRAMUSCULAR | Status: DC
  Administered 2014-03-30 – 2014-03-31 (×2): 3 mL via INTRAVENOUS

## 2014-03-30 NOTE — Progress Notes (Signed)
Patient ID: [name]  female  [id number]    DOB: 09/13/1985    DOA: 03/29/2014  PCP: Leo GrosserPICKARD,WARREN TOM, MD  Brief narrative  Patient is a 28 year old female with history of depression, law student, history of self-harm since high school, not followed by psychiatry outpatient reported taking 12 Ambien 10 mg each, 8 Xanax, drank one glass of wine. Patient also has laceration to her forehead, cannot recall events. Patient was admitted for Xanax and Ambien overdose.  Assessment/Plan: Principal Problem:   Drug overdose, intentional/suicide attempt with benzodiazepine, Ambien - Currently alert and awake, patient was admitted for observation, O2 sats 99% on room air -Continue suicide precautions, one-to-one sitter, psychiatry consulted   Active Problems:   Head injury - Patient could not recall the events, CT scan of the head unremarkable    Depression - Hold off on Effexor and Zoloft, psychiatry to evaluate  DVT Prophylaxis:SCDs  Code Status:Full code  Family Communication:No family member at the bedside.   Disposition:Currently stable, awaiting psychiatry evaluation  Consultants: Psychiatry   Procedures: None  Antibiotics:  none  Subjective: Patient is currently stable, denies any specific complaints  Objective: Weight change:   Intake/Output Summary (Last 24 hours) at 03/30/14 1112 Last data filed at 03/30/14 0830  Gross per 24 hour  Intake  542.5 ml  Output      0 ml  Net  542.5 ml   Blood pressure 132/89, pulse 89, temperature 97.6 F (36.4 C), temperature source Oral, resp. rate 16, height 5\' 2"  (1.575 m), weight 57.7 kg (127 lb 3.3 oz), last menstrual period 03/29/2014, SpO2 99 %.  Physical Exam: General: Alert and awake, oriented x3, not in any acute distress. HEENT : forehead dressing intact  CVS: S1-S2 clear, no murmur rubs or gallops Chest: clear to auscultation bilaterally, no wheezing, rales or rhonchi Abdomen: soft nontender, nondistended,  normal bowel sounds  Extremities: no cyanosis, clubbing or edema noted bilaterally Neuro: Cranial nerves II-XII intact, no focal neurological deficits  Lab Results: Basic Metabolic Panel:  Recent Labs Lab 03/29/14 1911 03/30/14 0515  NA 138 138  K 3.7 3.4*  CL 101 105  CO2 21 21  GLUCOSE 82 98  BUN 12 8  CREATININE 0.61 0.61  CALCIUM 8.9 8.0*  MG  --  1.5  PHOS  --  2.5   Liver Function Tests:  Recent Labs Lab 03/29/14 1911 03/30/14 0515  AST 22 18  ALT 20 16  ALKPHOS 71 77  BILITOT 0.9 1.3*  PROT 7.3 6.0  ALBUMIN 3.6 2.9*   No results for input(s): LIPASE, AMYLASE in the last 168 hours. No results for input(s): AMMONIA in the last 168 hours. CBC:  Recent Labs Lab 03/29/14 1911 03/30/14 0515  WBC 8.7 9.3  HGB 11.2* 10.9*  HCT 34.5* 33.3*  MCV 92.5 93.8  PLT 259 204   Cardiac Enzymes: No results for input(s): CKTOTAL, CKMB, CKMBINDEX, TROPONINI in the last 168 hours. BNP: Invalid input(s): POCBNP CBG: No results for input(s): GLUCAP in the last 168 hours.   Micro Results: No results found for this or any previous visit (from the past 240 hour(s)).  Studies/Results: Ct Head Wo Contrast  03/29/2014   CLINICAL DATA:  Found unresponsive at home, drug overdose (Ambien and Xanax), RIGHT frontal abrasion, head injury  EXAM: CT HEAD WITHOUT CONTRAST  TECHNIQUE: Contiguous axial images were obtained from the base of the skull through the vertex without intravenous contrast.  COMPARISON:  None  FINDINGS: Normal ventricular morphology.  No midline shift or mass effect.  Normal appearance of brain parenchyma.  No intracranial hemorrhage, mass lesion or evidence acute infarction.  No extra-axial fluid collections.  Bones and sinuses unremarkable.  Small RIGHT frontal scalp hematoma and laceration.  IMPRESSION: No acute intracranial abnormalities.   Electronically Signed   By: Ulyses SouthwardMark  Boles M.D.   On: 03/29/2014 20:09    Medications: Scheduled Meds: . sodium chloride    Intravenous STAT  . lidocaine      . lidocaine-EPINEPHrine  20 mL Other Once  . lidocaine-EPINEPHrine  10 mL Infiltration Once  . sodium chloride  3 mL Intravenous Q12H      LOS: 1 day   RAI,RIPUDEEP M.D. [facility] 03/30/2014, 11:12 AM Pager: [phone]  If 7PM-7AM, please contact night-coverage [email or web address] Password [email or web address]

## 2014-03-30 NOTE — Consult Note (Signed)
[name]   Reason for Graham:  Depression and status post suicidal attempt Referring Physician:  Dr. Tilda Graham is an 28 y.o. female. Total Time spent with patient: 45 minutes  Assessment: AXIS I:  Generalized Anxiety Disorder and Major Depression, Recurrent severe AXIS II:  Deferred AXIS III:   Past Medical History  Diagnosis Date  . Bartholin cyst     LEFT  . Cervical dysplasia   . Depression    AXIS IV:  other psychosocial or environmental problems, problems related to social environment and problems with primary support group AXIS V:  41-50 serious symptoms  Plan:  Case discussed with the patient under Claudia Messing, LCSW Hold Ambien and Xanax and monitor for the benzodiazepine withdrawals Monitor with CIWA protocol Continue Effexor XR 75 mg daily morning with breakfast, start clonazepam 0.5 mg twice daily as needed for anxiety and been just withdrawal symptoms and hydroxyzine 50 mg at bedtime for insomnia. Recommend psychiatric Inpatient admission when medically cleared. Supportive therapy provided about ongoing stressors. Appreciate psychiatric consultation and follow up as clinically required Please contact [phone] or [phone] if needs further assistance  Subjective:   [name] is a 28 y.o. female patient admitted with Depression and status post suicidal attempt.  HPI:  Patient seen, chart reviewed for psychiatric consultation and evaluation of status post suicidal attempt with overdose of her medication. Patient reported that she has been suffering with depression over several years and her depression has been increased over 2 weeks. Patient also reported she has a self-injurious behavior but a week ago and then made a suicidal attempt to end her life. Patient recalls her symptoms of depression being sad, isolated, withdrawn, not able to enjoy her schoolwork and children. Patient stated her best friend shot herself about a year ago  secondary to marital dispute. Patient has 2 young children and they father has been in and out of the relationship. Patient reportedly stressed about her last school, taking care of the children and not having enough funds etc. Patient reported she already accepted a job after graduation in [place]. 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 Patient has no inpatient or outpatient psychiatric services and she has been receiving her antidepressant medication from [facility] family medicine. Patient reported taking Zoloft up to 75 mg a day which was later changed to Effexor unknown dose to the patient about a week ago. Patient reported her Zoloft quit working for her and has increased stressors. Patient mother's is at bedside seems to be very supportive.  Medical history: Patient not sure what was the trigger. States she has hx of depression but never attempted suicide in the past. She does endorse history of self-harm since high school. Has not been followed by psychiatry. Patient reports taking 12 tabs of Ambien 10 mg each and 8 tabs of Xanax all that she had available. She also drank 1 glass of wine. Un sure while she was trying to hurt self but confirms attempt at self harm. patient has laceration to her forehead but unsure how that happened.  ER M.D. had contacted [facility] who recommended observation overnight. CT scan of the head was unremarkable except for right frontal scalp hematoma and laceration Hospitalist was called for admission for oversedation due to Xanax and Ambien overdose.   Review of Systems:  Laceration on her head secondary to fall while intoxicated andPertinent positives include: headaches,  HPI Elements:   Location:  Depression.  Quality:  Disturbance of sleep and appetite loss of enjoyment in usual activities. Severity:  Suicidal attempt with overdose and self-injurious behaviors. Timing:  best friend killed herself about a year ago. Duration:  Since  February. Context:  Change of antidepressant medication about a week ago.  Past Psychiatric History: Past Medical History  Diagnosis Date  . Bartholin cyst     LEFT  . Cervical dysplasia   . Depression     reports that she has quit smoking. She has never used smokeless tobacco. She reports that she drinks alcohol. She reports that she does not use illicit drugs. Family History  Problem Relation Age of Onset  . Cancer Paternal Grandmother     CERVICAL  . Diabetes Father      Living Arrangements: Alone   Abuse/Neglect [facility]) Physical Abuse: Denies Verbal Abuse: Denies Sexual Abuse: Denies Allergies:  No Known Allergies  ACT Assessment Complete:  NO Objective: Blood pressure 132/89, pulse 89, temperature 97.6 F (36.4 C), temperature source Oral, resp. rate 16, height $RemoveBe'5\' 2"'nFAEZpuJm$  (1.575 m), weight 57.7 kg (127 lb 3.3 oz), last menstrual period 03/29/2014, SpO2 99 %.Body mass index is 23.26 kg/(m^2). Results for orders placed or performed during the hospital encounter of 03/29/14 (from the past 72 hour(s))  CBC     Status: Abnormal   Collection Time: 03/29/14  7:11 PM  Result Value Ref Range   WBC 8.7 4.0 - 10.5 K/uL   RBC 3.73 (L) 3.87 - 5.11 MIL/uL   Hemoglobin 11.2 (L) 12.0 - 15.0 g/dL   HCT 34.5 (L) 36.0 - 46.0 %   MCV 92.5 78.0 - 100.0 fL   MCH 30.0 26.0 - 34.0 pg   MCHC 32.5 30.0 - 36.0 g/dL   RDW 12.5 11.5 - 15.5 %   Platelets 259 150 - 400 K/uL  Comprehensive metabolic panel     Status: Abnormal   Collection Time: 03/29/14  7:11 PM  Result Value Ref Range   Sodium 138 137 - 147 mEq/L   Potassium 3.7 3.7 - 5.3 mEq/L   Chloride 101 96 - 112 mEq/L   CO2 21 19 - 32 mEq/L   Glucose, Bld 82 70 - 99 mg/dL   BUN 12 6 - 23 mg/dL   Creatinine, Ser 0.61 0.50 - 1.10 mg/dL   Calcium 8.9 8.4 - 10.5 mg/dL   Total Protein 7.3 6.0 - 8.3 g/dL   Albumin 3.6 3.5 - 5.2 g/dL   AST 22 0 - 37 U/L   ALT 20 0 - 35 U/L   Alkaline Phosphatase 71 39 - 117 U/L   Total Bilirubin 0.9 0.3 -  1.2 mg/dL   GFR calc non Af Amer >90 >90 mL/min   GFR calc Af Amer >90 >90 mL/min    Comment: (NOTE) The eGFR has been calculated using the CKD EPI equation. This calculation has not been validated in all clinical situations. eGFR's persistently <90 mL/min signify possible Chronic Kidney Disease.    Anion gap 16 (H) 5 - 15  Ethanol (ETOH)     Status: None   Collection Time: 03/29/14  7:11 PM  Result Value Ref Range   Alcohol, Ethyl (B) <11 0 - 11 mg/dL    Comment:        LOWEST DETECTABLE LIMIT FOR SERUM ALCOHOL IS 11 mg/dL FOR MEDICAL PURPOSES ONLY   Acetaminophen level     Status: None   Collection Time: 03/29/14  7:11 PM  Result Value Ref Range   Acetaminophen (Tylenol),  Serum <15.0 10 - 30 ug/mL    Comment:        THERAPEUTIC CONCENTRATIONS VARY SIGNIFICANTLY. A RANGE OF 10-30 ug/mL MAY BE AN EFFECTIVE CONCENTRATION FOR MANY PATIENTS. HOWEVER, SOME ARE BEST TREATED AT CONCENTRATIONS OUTSIDE THIS RANGE. ACETAMINOPHEN CONCENTRATIONS >150 ug/mL AT 4 HOURS AFTER INGESTION AND >50 ug/mL AT 12 HOURS AFTER INGESTION ARE OFTEN ASSOCIATED WITH TOXIC REACTIONS.   Salicylate level     Status: Abnormal   Collection Time: 03/29/14  7:11 PM  Result Value Ref Range   Salicylate Lvl <7.8 (L) 2.8 - 20.0 mg/dL  Urine rapid drug screen (hosp performed)     Status: Abnormal   Collection Time: 03/29/14  8:04 PM  Result Value Ref Range   Opiates NONE DETECTED NONE DETECTED   Cocaine NONE DETECTED NONE DETECTED   Benzodiazepines POSITIVE (A) NONE DETECTED   Amphetamines NONE DETECTED NONE DETECTED   Tetrahydrocannabinol NONE DETECTED NONE DETECTED   Barbiturates NONE DETECTED NONE DETECTED    Comment:        DRUG SCREEN FOR MEDICAL PURPOSES ONLY.  IF CONFIRMATION IS NEEDED FOR ANY PURPOSE, NOTIFY LAB WITHIN 5 DAYS.        LOWEST DETECTABLE LIMITS FOR URINE DRUG SCREEN Drug Class       Cutoff (ng/mL) Amphetamine      1000 Barbiturate      200 Benzodiazepine   295 Tricyclics        621 Opiates          300 Cocaine          300 THC              50   Pregnancy, urine (if pre-menopausal female)  MHP     Status: None   Collection Time: 03/29/14  8:04 PM  Result Value Ref Range   Preg Test, Ur NEGATIVE NEGATIVE    Comment:        THE SENSITIVITY OF THIS METHODOLOGY IS >20 mIU/mL.   Magnesium     Status: None   Collection Time: 03/30/14  5:15 AM  Result Value Ref Range   Magnesium 1.5 1.5 - 2.5 mg/dL  Phosphorus     Status: None   Collection Time: 03/30/14  5:15 AM  Result Value Ref Range   Phosphorus 2.5 2.3 - 4.6 mg/dL  TSH     Status: None   Collection Time: 03/30/14  5:15 AM  Result Value Ref Range   TSH 1.800 0.350 - 4.500 uIU/mL    Comment: Performed at [facility]  Comprehensive metabolic panel     Status: Abnormal   Collection Time: 03/30/14  5:15 AM  Result Value Ref Range   Sodium 138 137 - 147 mEq/L   Potassium 3.4 (L) 3.7 - 5.3 mEq/L   Chloride 105 96 - 112 mEq/L   CO2 21 19 - 32 mEq/L   Glucose, Bld 98 70 - 99 mg/dL   BUN 8 6 - 23 mg/dL   Creatinine, Ser 0.61 0.50 - 1.10 mg/dL   Calcium 8.0 (L) 8.4 - 10.5 mg/dL   Total Protein 6.0 6.0 - 8.3 g/dL   Albumin 2.9 (L) 3.5 - 5.2 g/dL   AST 18 0 - 37 U/L   ALT 16 0 - 35 U/L   Alkaline Phosphatase 77 39 - 117 U/L   Total Bilirubin 1.3 (H) 0.3 - 1.2 mg/dL   GFR calc non Af Amer >90 >90 mL/min   GFR calc Af Amer >90 >90 mL/min  Comment: (NOTE) The eGFR has been calculated using the CKD EPI equation. This calculation has not been validated in all clinical situations. eGFR's persistently <90 mL/min signify possible Chronic Kidney Disease.    Anion gap 12 5 - 15  CBC     Status: Abnormal   Collection Time: 03/30/14  5:15 AM  Result Value Ref Range   WBC 9.3 4.0 - 10.5 K/uL   RBC 3.55 (L) 3.87 - 5.11 MIL/uL   Hemoglobin 10.9 (L) 12.0 - 15.0 g/dL   HCT 33.3 (L) 36.0 - 46.0 %   MCV 93.8 78.0 - 100.0 fL   MCH 30.7 26.0 - 34.0 pg   MCHC 32.7 30.0 - 36.0 g/dL   RDW 12.4 11.5 -  15.5 %   Platelets 204 150 - 400 K/uL   Labs are reviewed and are pertinent for benzo's.  Current Facility-Administered Medications  Medication Dose Route Frequency Provider Last Rate Last Dose  . 0.9 %  sodium chloride infusion   Intravenous STAT Ernestina Patches, MD   0  at 03/30/14 0100  . 0.9 %  sodium chloride infusion   Intravenous Continuous Toy Baker, MD 75 mL/hr at 03/30/14 0122    . HYDROcodone-acetaminophen (NORCO/VICODIN) 5-325 MG per tablet 1-2 tablet  1-2 tablet Oral Q4H PRN Toy Baker, MD   1 tablet at 03/30/14 0745  . lidocaine (XYLOCAINE) 1 % (with pres) injection           . lidocaine-EPINEPHrine (XYLOCAINE W/EPI) 2 %-1:100000 (with pres) injection 20 mL  20 mL Other Once Ernestina Patches, MD      . lidocaine-EPINEPHrine (XYLOCAINE W/EPI) 2 %-1:200000 (PF) injection 10 mL  10 mL Infiltration Once Ernestina Patches, MD      . ondansetron (ZOFRAN) tablet 4 mg  4 mg Oral Q6H PRN Toy Baker, MD       Or  . ondansetron (ZOFRAN) injection 4 mg  4 mg Intravenous Q6H PRN Toy Baker, MD      . sodium chloride 0.9 % injection 3 mL  3 mL Intravenous Q12H Toy Baker, MD   3 mL at 03/30/14 0122    Psychiatric Specialty Exam: Physical Exam  ROS  Blood pressure 132/89, pulse 89, temperature 97.6 F (36.4 C), temperature source Oral, resp. rate 16, height $RemoveBe'5\' 2"'svQJPGlIQ$  (1.575 m), weight 57.7 kg (127 lb 3.3 oz), last menstrual period 03/29/2014, SpO2 99 %.Body mass index is 23.26 kg/(m^2).  General Appearance: Guarded  Eye Contact::  Good  Speech:  Clear and Coherent  Volume:  Decreased  Mood:  Anxious, Depressed and Hopeless  Affect:  Appropriate and Congruent  Thought Process:  Coherent and Goal Directed  Orientation:  Full (Time, Place, and Person)  Thought Content:  WDL  Suicidal Thoughts:  Yes.  with intent/plan  Homicidal Thoughts:  No  Memory:  Immediate;   Good Recent;   Good  Judgement:  Impaired  Insight:  Lacking  Psychomotor Activity:   Decreased  Concentration:  Good  Recall:  Good  Fund of Knowledge:Good  Language: Good  Akathisia:  NA  Handed:  Right  AIMS (if indicated):     Assets:  Communication Skills Desire for Improvement Financial Resources/Insurance Housing Intimacy Leisure Time [place] Talents/Skills Transportation Vocational/Educational  Sleep:      Musculoskeletal: Strength & Muscle Tone: within normal limits Gait & Station: unable to stand Patient leans: N/A  Treatment Plan Summary: Daily contact with patient to assess and evaluate symptoms and progress in treatment Medication management  We start home medication Effexor XR 75 mg daily morning with breakfast, start clonazepam 0.5 mg twice daily as needed for anxiety and been just withdrawal symptoms and hydroxyzine 50 mg at bedtime for insomnia.  Toba Claudio,JANARDHAHA R. 03/30/2014 10:09 AM

## 2014-03-30 NOTE — Progress Notes (Signed)
Clinical Social Work  CSW met with patient at bedside. Patient's mother, sister, sons, and niece were present. Patient asked that CSW follow up at later time. CSW will round with psych MD and full assessment to follow.  [place], [place] [phone]

## 2014-03-30 NOTE — Progress Notes (Signed)
Clinical Social Work Department CLINICAL SOCIAL WORK PSYCHIATRY SERVICE LINE ASSESSMENT 03/30/2014  Patient:  [name]  Account:  [id number]  [place] Date:  03/29/2014  Clinical Social Worker:  Sindy Messing, LCSW  Date/Time:  03/30/2014 04:00 PM Referred by:  Physician  Date referred:  03/30/2014 Reason for Referral  Psychosocial assessment   Presenting Symptoms/Problems (In the person's/family's own words):   Psych consulted due to overdose.   Abuse/Neglect/Trauma History (check all that apply)  Denies history   Abuse/Neglect/Trauma Comments:   Psychiatric History (check all that apply)  Outpatient treatment   Psychiatric medications:  Effexor   Current Mental Health Hospitalizations/Previous Mental Health History:   Patient reports she was diagnosed with depression when she was in high school. Patient reports her PCP prescribes her medication and she has never seen a psychiatrist in the past.   Current provider:   Dr. Dennard Schaumann   Place and Date:   [facility] [place], [place]   Current Medications:   Scheduled Meds:      . lidocaine-EPINEPHrine  20 mL Other Once  . lidocaine-EPINEPHrine  10 mL Infiltration Once  . sodium chloride  3 mL Intravenous Q12H        Continuous Infusions:      PRN Meds:.HYDROcodone-acetaminophen, ondansetron **OR** ondansetron (ZOFRAN) IV       Previous Impatient Admission/Date/Reason:   None reported   Emotional Health / Current Symptoms    Suicide/Self Harm  Suicide attempt in past (date/description)   Suicide attempt in the past:   Patient reports on November 9th she cut her wrists but wounds were not deep enough to warrant hospitalization. Patient was admitted after overdosing on Xanax and Ambien. Patient denies any current SI or HI.   Other harmful behavior:   Patient cut wrists but denies any ongoing harmful behavior.   Psychotic/Dissociative Symptoms  None reported   Other Psychotic/Dissociative Symptoms:   N/A     Attention/Behavioral Symptoms  Within Normal Limits   Other Attention / Behavioral Symptoms:   Patient engaged during assessment.    Cognitive Impairment  Within Normal Limits   Other Cognitive Impairment:   Patient alert and oriented.    Mood and Adjustment  Flat    Stress, Anxiety, Trauma, Any Recent Loss/Stressor  Grief/Loss (recent or history)   Anxiety (frequency):   N/A   Phobia (specify):   N/A   Compulsive behavior (specify):   N/A   Obsessive behavior (specify):   N/A   Other:   Patient reports her best friend committed suicide in Jan. 2015.   Substance Abuse/Use  None   SBIRT completed (please refer for detailed history):  N  Self-reported substance use:   Patient denies any substance use.   Urinary Drug Screen Completed:  Y Alcohol level:   <11    Environmental/Housing/Living Arrangement  Stable housing   Who is in the home:   56 and 35 year old sons   Emergency contact:  Research scientist (life sciences)  Medicaid   Patient's Strengths and Goals (patient's own words):   Patient reports supportive family. 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   Clinical Social Worker's Interpretive Summary:   CSW received referral in order to complete psychosocial assessment. CSW reviewed chart and met with patient at bedside along with psych MD.    Patient reports she lives at home and has two sons. Patient's mother at bedside but stepped out of room during assessment. Patient is currently in law school and  reports she has accepted a job offer in [employer] after graduation. Patient reports that school is stressful but she is in the top of her class.    Patient reports she was diagnosed with depression in high school. Patient reports she saw a therapist in high school but no current follow up. Patient's PCP was prescribing Zoloft but patient felt it stopped working so PCP changed medications to Effexor. Patient reports  she wanted the medication to work again but felt overwhelmed. Patient reports that best friend committed suicide in January 2015 and it has been difficult to process her death. Patient reports that she tried to cut her wrists in November but never received any further treatment.    Patient engaged in assessment but has flat affect. Psych MD explained the need for inpatient psych hospitalization and patient aware but reports that she is upset that she will miss exams at school.    CSW will continue to follow and will assist with inpatient placement once medically stable.   Disposition:  Recommend Psych CSW continuing to support while in hospital   [place], [place] [phone]

## 2014-03-30 NOTE — Progress Notes (Signed)
UR completed 

## 2014-03-31 DIAGNOSIS — T50902A Poisoning by unspecified drugs, medicaments and biological substances, intentional self-harm, initial encounter: Secondary | ICD-10-CM

## 2014-03-31 MED ORDER — HYDROXYZINE HCL 50 MG PO TABS: 50.0000 mg | ORAL_TABLET | Freq: Every day | ORAL | Status: DC

## 2014-03-31 MED ORDER — CLONAZEPAM 0.5 MG PO TABS: 0.5000 mg | ORAL_TABLET | Freq: Two times a day (BID) | ORAL | Status: DC | PRN

## 2014-03-31 NOTE — Consult Note (Signed)
Psychiatry Consult follow Up  Reason for Consult:  Depression and status post suicidal attempt Referring Physician:  [name]. Total Time spent with patient: 20 minutes  Assessment: AXIS I:  Generalized Anxiety Disorder and Major Depression, Recurrent severe AXIS II:  Deferred AXIS III:   Past Medical History  Diagnosis Date  . Bartholin cyst     LEFT  . Cervical dysplasia   . Depression    AXIS IV:  other psychosocial or environmental problems, problems related to social environment and problems with primary support group AXIS V:  41-50 serious symptoms  Plan:  Case discussed with the patient, Dr. Tana Coast, Sindy Messing, LCSW and Debarah Crape, [facility] from [place] and Xanax and monitor for the benzodiazepine withdrawals Monitor with CIWA protocol Continue Effexor XR 75 mg daily morning with breakfast, clonazepam 0.5 mg twice daily as needed for anxiety and been just withdrawal symptoms and hydroxyzine 50 mg at bedtime for insomnia. Recommend psychiatric Inpatient admission when medically cleared. Supportive therapy provided about ongoing stressors. Appreciate psychiatric consultation and follow up as clinically required Please contact [phone] or [phone] if needs further assistance  Subjective:   [name] is a 28 y.o. female patient admitted with Depression and status post suicidal attempt.  HPI:  Patient seen, chart reviewed for psychiatric consultation and evaluation of status post suicidal attempt with overdose of her medication. Patient reported that she has been suffering with depression over several years and her depression has been increased over 2 weeks. Patient also reported she has a self-injurious behavior but a week ago and then made a suicidal attempt to end her life. Patient recalls her symptoms of depression being sad, isolated, withdrawn, not able to enjoy her schoolwork and children. Patient stated her best friend shot herself  about a year ago secondary to marital dispute. Patient has 2 young children and they father has been in and out of the relationship. Patient reportedly stressed about her last school, taking care of the children and not having enough funds etc. Patient reported she already accepted a job after graduation in [place]. 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 Patient has no inpatient or outpatient psychiatric services and she has been receiving her antidepressant medication from [facility] family medicine. Patient reported taking Zoloft up to 75 mg a day which was later changed to Effexor unknown dose to the patient about a week ago. Patient reported her Zoloft quit working for her and has increased stressors. Patient mother's is at bedside seems to be very supportive.  Interval History: Patient is seen for psych consultation follow up today. Patient has been compliant with her medication treatment and reportedly slept fine and able eat okay. She has pain on her face and eye but minimizes the severity. She appeared staying in her bed and reading her books. Patient has been stable medically and continue to endorses symptoms of depression and status post suicidal attempt. She is willing to sign in voluntarily but at same time she wants to stay minimum days because her examination was postponed for this treatment. She has supportive family.   Past Psychiatric History: Past Medical History  Diagnosis Date  . Bartholin cyst     LEFT  . Cervical dysplasia   . Depression     reports that she has quit smoking. She has never used smokeless tobacco. She reports that she drinks alcohol. She reports that she does not use  illicit drugs. Family History  Problem Relation Age of Onset  . Cancer Paternal Grandmother     CERVICAL  . Diabetes Father      Living Arrangements: Alone   Abuse/Neglect [facility]) Physical Abuse: Denies Verbal Abuse: Denies Sexual Abuse: Denies Allergies:  No Known  Allergies  ACT Assessment Complete:  NO Objective: Blood pressure 118/77, pulse 65, temperature 97.9 F (36.6 C), temperature source Oral, resp. rate 16, height _0  (1.575 m), weight 57.7 kg (127 lb 3.3 oz), last menstrual period 03/29/2014, SpO2 100 %.Body mass index is 23.26 kg/(m^2). Results for orders placed or performed during the hospital encounter of 03/29/14 (from the past 72 hour(s))  CBC     Status: Abnormal   Collection Time: 03/29/14  7:11 PM  Result Value Ref Range   WBC 8.7 4.0 - 10.5 K/uL   RBC 3.73 (L) 3.87 - 5.11 MIL/uL   Hemoglobin 11.2 (L) 12.0 - 15.0 g/dL   HCT 34.5 (L) 36.0 - 46.0 %   MCV 92.5 78.0 - 100.0 fL   MCH 30.0 26.0 - 34.0 pg   MCHC 32.5 30.0 - 36.0 g/dL   RDW 12.5 11.5 - 15.5 %   Platelets 259 150 - 400 K/uL  Comprehensive metabolic panel     Status: Abnormal   Collection Time: 03/29/14  7:11 PM  Result Value Ref Range   Sodium 138 137 - 147 mEq/L   Potassium 3.7 3.7 - 5.3 mEq/L   Chloride 101 96 - 112 mEq/L   CO2 21 19 - 32 mEq/L   Glucose, Bld 82 70 - 99 mg/dL   BUN 12 6 - 23 mg/dL   Creatinine, Ser 0.61 0.50 - 1.10 mg/dL   Calcium 8.9 8.4 - 10.5 mg/dL   Total Protein 7.3 6.0 - 8.3 g/dL   Albumin 3.6 3.5 - 5.2 g/dL   AST 22 0 - 37 U/L   ALT 20 0 - 35 U/L   Alkaline Phosphatase 71 39 - 117 U/L   Total Bilirubin 0.9 0.3 - 1.2 mg/dL   GFR calc non Af Amer >90 >90 mL/min   GFR calc Af Amer >90 >90 mL/min    Comment: (NOTE) The eGFR has been calculated using the CKD EPI equation. This calculation has not been validated in all clinical situations. eGFR's persistently <90 mL/min signify possible Chronic Kidney Disease.    Anion gap 16 (H) 5 - 15  Ethanol (ETOH)     Status: None   Collection Time: 03/29/14  7:11 PM  Result Value Ref Range   Alcohol, Ethyl (B) <11 0 - 11 mg/dL    Comment:        LOWEST DETECTABLE LIMIT FOR SERUM ALCOHOL IS 11 mg/dL FOR MEDICAL PURPOSES ONLY   Acetaminophen level     Status: None   Collection Time:  03/29/14  7:11 PM  Result Value Ref Range   Acetaminophen (Tylenol), Serum <15.0 10 - 30 ug/mL    Comment:        THERAPEUTIC CONCENTRATIONS VARY SIGNIFICANTLY. A RANGE OF 10-30 ug/mL MAY BE AN EFFECTIVE CONCENTRATION FOR MANY PATIENTS. HOWEVER, SOME ARE BEST TREATED AT CONCENTRATIONS OUTSIDE THIS RANGE. ACETAMINOPHEN CONCENTRATIONS >150 ug/mL AT 4 HOURS AFTER INGESTION AND >50 ug/mL AT 12 HOURS AFTER INGESTION ARE OFTEN ASSOCIATED WITH TOXIC REACTIONS.   Salicylate level     Status: Abnormal   Collection Time: 03/29/14  7:11 PM  Result Value Ref Range   Salicylate Lvl <2.6 (L) 2.8 - 20.0 mg/dL  Urine  rapid drug screen (hosp performed)     Status: Abnormal   Collection Time: 03/29/14  8:04 PM  Result Value Ref Range   Opiates NONE DETECTED NONE DETECTED   Cocaine NONE DETECTED NONE DETECTED   Benzodiazepines POSITIVE (A) NONE DETECTED   Amphetamines NONE DETECTED NONE DETECTED   Tetrahydrocannabinol NONE DETECTED NONE DETECTED   Barbiturates NONE DETECTED NONE DETECTED    Comment:        DRUG SCREEN FOR MEDICAL PURPOSES ONLY.  IF CONFIRMATION IS NEEDED FOR ANY PURPOSE, NOTIFY LAB WITHIN 5 DAYS.        LOWEST DETECTABLE LIMITS FOR URINE DRUG SCREEN Drug Class       Cutoff (ng/mL) Amphetamine      1000 Barbiturate      200 Benzodiazepine   914 Tricyclics       782 Opiates          300 Cocaine          300 THC              50   Pregnancy, urine (if pre-menopausal female)  MHP     Status: None   Collection Time: 03/29/14  8:04 PM  Result Value Ref Range   Preg Test, Ur NEGATIVE NEGATIVE    Comment:        THE SENSITIVITY OF THIS METHODOLOGY IS >20 mIU/mL.   Magnesium     Status: None   Collection Time: 03/30/14  5:15 AM  Result Value Ref Range   Magnesium 1.5 1.5 - 2.5 mg/dL  Phosphorus     Status: None   Collection Time: 03/30/14  5:15 AM  Result Value Ref Range   Phosphorus 2.5 2.3 - 4.6 mg/dL  TSH     Status: None   Collection Time: 03/30/14  5:15 AM   Result Value Ref Range   TSH 1.800 0.350 - 4.500 uIU/mL    Comment: Performed at [facility]  Comprehensive metabolic panel     Status: Abnormal   Collection Time: 03/30/14  5:15 AM  Result Value Ref Range   Sodium 138 137 - 147 mEq/L   Potassium 3.4 (L) 3.7 - 5.3 mEq/L   Chloride 105 96 - 112 mEq/L   CO2 21 19 - 32 mEq/L   Glucose, Bld 98 70 - 99 mg/dL   BUN 8 6 - 23 mg/dL   Creatinine, Ser 0.61 0.50 - 1.10 mg/dL   Calcium 8.0 (L) 8.4 - 10.5 mg/dL   Total Protein 6.0 6.0 - 8.3 g/dL   Albumin 2.9 (L) 3.5 - 5.2 g/dL   AST 18 0 - 37 U/L   ALT 16 0 - 35 U/L   Alkaline Phosphatase 77 39 - 117 U/L   Total Bilirubin 1.3 (H) 0.3 - 1.2 mg/dL   GFR calc non Af Amer >90 >90 mL/min   GFR calc Af Amer >90 >90 mL/min    Comment: (NOTE) The eGFR has been calculated using the CKD EPI equation. This calculation has not been validated in all clinical situations. eGFR's persistently <90 mL/min signify possible Chronic Kidney Disease.    Anion gap 12 5 - 15  CBC     Status: Abnormal   Collection Time: 03/30/14  5:15 AM  Result Value Ref Range   WBC 9.3 4.0 - 10.5 K/uL   RBC 3.55 (L) 3.87 - 5.11 MIL/uL   Hemoglobin 10.9 (L) 12.0 - 15.0 g/dL   HCT 33.3 (L) 36.0 - 46.0 %   MCV  93.8 78.0 - 100.0 fL   MCH 30.7 26.0 - 34.0 pg   MCHC 32.7 30.0 - 36.0 g/dL   RDW 12.4 11.5 - 15.5 %   Platelets 204 150 - 400 K/uL   Labs are reviewed and are pertinent for benzo's.  Current Facility-Administered Medications  Medication Dose Route Frequency Provider Last Rate Last Dose  . clonazePAM (KLONOPIN) tablet 0.5 mg  0.5 mg Oral BID PRN Durward Parcel, MD      . HYDROcodone-acetaminophen (NORCO/VICODIN) 5-325 MG per tablet 1-2 tablet  1-2 tablet Oral Q4H PRN Toy Baker, MD   1 tablet at 03/31/14 0652  . hydrOXYzine (ATARAX/VISTARIL) tablet 50 mg  50 mg Oral QHS Durward Parcel, MD   50 mg at 03/30/14 2202  . lidocaine-EPINEPHrine (XYLOCAINE W/EPI) 2 %-1:100000 (with  pres) injection 20 mL  20 mL Other Once Ernestina Patches, MD      . lidocaine-EPINEPHrine (XYLOCAINE W/EPI) 2 %-1:200000 (PF) injection 10 mL  10 mL Infiltration Once Ernestina Patches, MD      . ondansetron (ZOFRAN) tablet 4 mg  4 mg Oral Q6H PRN Toy Baker, MD       Or  . ondansetron (ZOFRAN) injection 4 mg  4 mg Intravenous Q6H PRN Toy Baker, MD      . sodium chloride 0.9 % injection 3 mL  3 mL Intravenous Q12H Toy Baker, MD   3 mL at 03/30/14 0122  . venlafaxine XR (EFFEXOR-XR) 24 hr capsule 75 mg  75 mg Oral Q breakfast Durward Parcel, MD   75 mg at 03/31/14 0815    Psychiatric Specialty Exam: Physical Exam: As per history and physical  ROS: depression, anxiety and injury on her right fore head and swollen eye brow  Blood pressure 118/77, pulse 65, temperature 97.9 F (36.6 C), temperature source Oral, resp. rate 16, height _0  (1.575 m), weight 57.7 kg (127 lb 3.3 oz), last menstrual period 03/29/2014, SpO2 100 %.Body mass index is 23.26 kg/(m^2).  General Appearance: Guarded  Eye Contact::  Good  Speech:  Clear and Coherent  Volume:  Decreased  Mood:  Anxious, Depressed and Hopeless  Affect:  Appropriate and Congruent  Thought Process:  Coherent and Goal Directed  Orientation:  Full (Time, Place, and Person)  Thought Content:  WDL  Suicidal Thoughts:  Yes.  with intent/plan  Homicidal Thoughts:  No  Memory:  Immediate;   Good Recent;   Good  Judgement:  Impaired  Insight:  Lacking  Psychomotor Activity:  Decreased  Concentration:  Good  Recall:  Good  Fund of Knowledge:Good  Language: Good  Akathisia:  NA  Handed:  Right  AIMS (if indicated):     Assets:  Communication Skills Desire for Improvement Financial Resources/Insurance Housing Intimacy Leisure Time [place] Talents/Skills Transportation Vocational/Educational  Sleep:      Musculoskeletal: Strength & Muscle Tone: within normal  limits Gait & Station: unable to stand Patient leans: N/A  Treatment Plan Summary: Daily contact with patient to assess and evaluate symptoms and progress in treatment Medication management  Continue Effexor XR 75 mg daily morning with breakfast Continue Clonazepam 0.5 mg twice daily as needed for anxiety and benzo's withdrawal Continue hydroxyzine 50 mg at bedtime for insomnia.  [name],Claudia R. 03/31/2014 9:28 AM

## 2014-03-31 NOTE — Progress Notes (Signed)
Clinical Social Work  CSW met with patient and sister at bedside. Patient agreeable for sister to be involved in session. Patient reports she knows she has to go to inpatient psych placement but is wondering when she will transfer. CSW explained DC plans and the need to find a facility that has available space. Patient reports she wants to stay as close as possible but understands process. CSW explained due to IVC that patient would have to be transported via [employer]. Patient reports understanding.  CSW contacted the following facilities re: placement:  [facility]- available beds. Referral faxed.  [facility]- AC (Eric) reports he will call CSW if bed is available  [facility]- available beds. Referral faxed.  [facility]- available beds. Referral faxed.  [facility]- no available beds  [facility]- no available beds  [facility]- available beds. Referral faxed.  [facility]- available beds. Referral faxed.   [facility]- no available beds  [facility]- available beds. Referral faxed.  [facility]- no available beds  [facility]- available beds. Referral faxed.  [facility]- wait list but referral faxed.  [facility]- no available beds  [facility]- no available beds  [facility]- no available beds  [facility]- no Medicaid accepted  [facility]- no available beds  [facility]- available beds. Referral faxed.  CSW will continue to follow to assist with placement. 

## 2014-03-31 NOTE — Progress Notes (Signed)
[facility], accompanied by sheriff.  IVC papers given to sheriff.

## 2014-03-31 NOTE — Discharge Summary (Signed)
Physician Discharge Summary  Patient ID: Oswaldo MilianKelly N Docken MRN: [id number] DOB/AGE: 28/09/1985 28 y.o.  Admit date: 03/29/2014 Discharge date: 03/31/2014  Primary Care Physician:  Leo GrosserPICKARD,WARREN TOM, MD  Discharge Diagnoses:    . Drug overdose, intentional . Benzodiazepine overdose . Head injury . Suicide attempt . Depression  Consults:  Psychiatry   Recommendations for Outpatient Follow-up:  Patient is to be followed closely by psychiatry service outpatient  TESTS THAT NEED FOLLOW-UP None   DIET: Regular   Allergies:  No Known Allergies   Discharge Medications:   Medication List    STOP taking these medications        ALPRAZolam 0.5 MG tablet  Commonly known as:  XANAX     sertraline 50 MG tablet  Commonly known as:  ZOLOFT     zolpidem 10 MG tablet  Commonly known as:  AMBIEN      TAKE these medications        clonazePAM 0.5 MG tablet  Commonly known as:  KLONOPIN  Take 1 tablet (0.5 mg total) by mouth 2 (two) times daily as needed (Anxiety and benzodiazepine withdrawal).     diphenhydrAMINE 25 mg capsule  Commonly known as:  BENADRYL  Take 1 capsule (25 mg total) by mouth every 8 (eight) hours as needed for allergies.     EFFEXOR XR 75 MG 24 hr capsule  Generic drug:  venlafaxine XR  Take 2 capsules (150 mg total) by mouth daily with breakfast.     ibuprofen 200 MG tablet  Commonly known as:  ADVIL,MOTRIN  Take 400 mg by mouth every 6 (six) hours as needed for moderate pain.     norgestimate-ethinyl estradiol 0.25-35 MG-MCG tablet  Commonly known as:  ORTHO-CYCLEN,SPRINTEC,PREVIFEM  Take 1 tablet by mouth daily.         Brief H and P: For complete details please refer to admission H and P, but in brief patient is a 28 year old female with depression, has history of self-harm, not being followed by psychiatry outpatient. Patient reported taking 12 tablets of Ambien 10 mg each and 8 tabs of Xanax, drank one glass of wine. Poison control was  consulted and recommended observation overnight. CT scan of the head was unremarkable except for right frontal scalp hematoma and laceration. Hospitalist service was requested for Xanax and Ambien overdose.  Hospital Course:  Intentional/suicide attempt with benzodiazepine, Ambien Patient was admitted for Xanax and Ambien overdose. Psychiatry was consulted. The patient was placed on suicide precautions, one to one sitter. Patient was closely followed by psychiatry service, Dr.Jonalagada , recommended continuing Effexor 75 mg daily with breakfast, clonazepam 0.5 mg twice daily as needed for anxiety and benzodiazepine withdrawal, hydroxyzine 50 mg at bedtime for insomnia. Patient was involuntary committed at the time of admission.  Per psychiatry social worker, she [facility]. Patient is medically stable to be discharged.  Forehead laceration Patient could not recall the events, CT scan of the head was unremarkable  Day of Discharge BP 129/89 mmHg  Pulse 88  Temp(Src) 98.2 F (36.8 C) (Oral)  Resp 16  Ht 5\' 2"  (1.575 m)  Wt 57.7 kg (127 lb 3.3 oz)  BMI 23.26 kg/m2  SpO2 99%  LMP 03/29/2014  Physical Exam: General: Alert and awake oriented x3 not in any acute distress. CVS: S1-S2 clear no murmur rubs or gallops Chest: clear to auscultation bilaterally, no wheezing rales or rhonchi Abdomen: soft nontender, nondistended, normal bowel sounds Extremities: no cyanosis, clubbing or  edema noted bilaterally Neuro: Cranial nerves II-XII intact, no focal neurological deficits   The results of significant diagnostics from this hospitalization (including imaging, microbiology, ancillary and laboratory) are listed below for reference.    LAB RESULTS: Basic Metabolic Panel:  Recent Labs Lab 03/29/14 1911 03/30/14 0515  NA 138 138  K 3.7 3.4*  CL 101 105  CO2 21 21  GLUCOSE 82 98  BUN 12 8  CREATININE 0.61 0.61  CALCIUM 8.9 8.0*  MG  --  1.5   PHOS  --  2.5   Liver Function Tests:  Recent Labs Lab 03/29/14 1911 03/30/14 0515  AST 22 18  ALT 20 16  ALKPHOS 71 77  BILITOT 0.9 1.3*  PROT 7.3 6.0  ALBUMIN 3.6 2.9*   No results for input(s): LIPASE, AMYLASE in the last 168 hours. No results for input(s): AMMONIA in the last 168 hours. CBC:  Recent Labs Lab 03/29/14 1911 03/30/14 0515  WBC 8.7 9.3  HGB 11.2* 10.9*  HCT 34.5* 33.3*  MCV 92.5 93.8  PLT 259 204   Cardiac Enzymes: No results for input(s): CKTOTAL, CKMB, CKMBINDEX, TROPONINI in the last 168 hours. BNP: Invalid input(s): POCBNP CBG: No results for input(s): GLUCAP in the last 168 hours.  Significant Diagnostic Studies:  Ct Head Wo Contrast  03/29/2014   CLINICAL DATA:  Found unresponsive at home, drug overdose (Ambien and Xanax), RIGHT frontal abrasion, head injury  EXAM: CT HEAD WITHOUT CONTRAST  TECHNIQUE: Contiguous axial images were obtained from the base of the skull through the vertex without intravenous contrast.  COMPARISON:  None  FINDINGS: Normal ventricular morphology.  No midline shift or mass effect.  Normal appearance of brain parenchyma.  No intracranial hemorrhage, mass lesion or evidence acute infarction.  No extra-axial fluid collections.  Bones and sinuses unremarkable.  Small RIGHT frontal scalp hematoma and laceration.  IMPRESSION: No acute intracranial abnormalities.   Electronically Signed   By: Ulyses SouthwardMark  Boles M.D.   On: 03/29/2014 20:09    2D ECHO:   Disposition and Follow-up: Discharge Instructions    Diet general    Complete by:  As directed             DISPOSITION: [facility] facility  DISCHARGE FOLLOW-UP Follow-up Information    [facility]. Schedule an appointment as soon as possible for a visit in 2 weeks.   Specialty:  Family Medicine   Why:  for hospital follow-up   Contact information:   [address] [place] [address] [phone]        Time spent on  Discharge: 35 minutes  Signed:   Hobert Poplaski M.D. [facility] 03/31/2014, 2:14 PM Pager: [phone]

## 2014-03-31 NOTE — Progress Notes (Signed)
Clinical Social Work  [facility] by Dr. Chase PicketHerman to room 2592-1. RN to call report to [phone] or [phone]. CSW informed patient and family at bedside. Patient aware that sheriff will transport [facility]. Patient and family aware and agreeable to plans. CSW informed sheriff that patient needs transportation due to IVC. IVC paperwork at desk and sheriff reports they will call nursing station when on their way. CSW is signing off but available if needed.  Independence[place]olly Laporshia Hogen, [place] [phone]

## 2014-04-05 ENCOUNTER — Ambulatory Visit (INDEPENDENT_AMBULATORY_CARE_PROVIDER_SITE_OTHER): Payer: Medicaid Other | Admitting: Physician Assistant

## 2014-04-05 ENCOUNTER — Other Ambulatory Visit: Payer: Self-pay | Admitting: Internal Medicine

## 2014-04-05 ENCOUNTER — Encounter: Payer: Self-pay | Admitting: Physician Assistant

## 2014-04-05 VITALS — BP 122/86 | HR 88 | Temp 98.3°F | Resp 18 | Wt 127.0 lb

## 2014-04-05 DIAGNOSIS — T1491 Suicide attempt: Secondary | ICD-10-CM

## 2014-04-05 DIAGNOSIS — S0181XD Laceration without foreign body of other part of head, subsequent encounter: Secondary | ICD-10-CM

## 2014-04-05 DIAGNOSIS — S0990XD Unspecified injury of head, subsequent encounter: Secondary | ICD-10-CM

## 2014-04-05 DIAGNOSIS — T50902D Poisoning by unspecified drugs, medicaments and biological substances, intentional self-harm, subsequent encounter: Secondary | ICD-10-CM

## 2014-04-05 DIAGNOSIS — T1491XA Suicide attempt, initial encounter: Secondary | ICD-10-CM

## 2014-04-05 DIAGNOSIS — F329 Major depressive disorder, single episode, unspecified: Secondary | ICD-10-CM

## 2014-04-05 DIAGNOSIS — F32A Depression, unspecified: Secondary | ICD-10-CM

## 2014-04-05 DIAGNOSIS — T424X2D Poisoning by benzodiazepines, intentional self-harm, subsequent encounter: Secondary | ICD-10-CM

## 2014-04-05 NOTE — Progress Notes (Signed)
Patient ID: Claudia MilianKelly N Mauss MRN: [id number], DOB: 02/04/1986, 28 y.o. Date of Encounter: 04/05/2014, 12:58 PM    Chief Complaint:  Chief Complaint  Patient presents with  . ED follow up    wants to stop hydrocodone and get rx for Tramadol  . suture removal     HPI: 28 y.o. year old white female told nurse who put her in the room today--that she went to the ER because she had tripped over a cat and hit her head. Was requesting tramadol and suture removal.  When I went in the room, regarding the suture removal, she said that-- no it is not time to get the sutures taken out for another week. She says that the ER offered her some Vicodin but she told them she did not need them.  Says that she is taking ibuprofen but it is not working and she needs something for her headache.     Home Meds:   Outpatient Prescriptions Prior to Visit  Medication Sig Dispense Refill  . clonazePAM (KLONOPIN) 0.5 MG tablet Take 1 tablet (0.5 mg total) by mouth 2 (two) times daily as needed (Anxiety and benzodiazepine withdrawal). 30 tablet 0  . diphenhydrAMINE (BENADRYL) 25 mg capsule Take 1 capsule (25 mg total) by mouth every 8 (eight) hours as needed for allergies. 30 capsule 0  . EFFEXOR XR 75 MG 24 hr capsule Take 2 capsules (150 mg total) by mouth daily with breakfast. 60 capsule 5  . hydrOXYzine (ATARAX/VISTARIL) 50 MG tablet Take 1 tablet (50 mg total) by mouth at bedtime. 30 tablet 0  . ibuprofen (ADVIL,MOTRIN) 200 MG tablet Take 400 mg by mouth every 6 (six) hours as needed for moderate pain.    . norgestimate-ethinyl estradiol (ORTHO-CYCLEN,SPRINTEC,PREVIFEM) 0.25-35 MG-MCG tablet Take 1 tablet by mouth daily. 1 Package 11   No facility-administered medications prior to visit.    Allergies: No Known Allergies    Review of Systems: See HPI for pertinent ROS. All other ROS negative.    Physical Exam: Blood pressure 122/86, pulse 88, temperature 98.3 F (36.8 C), temperature source Oral,  resp. rate 18, weight 127 lb (57.607 kg), last menstrual period 03/29/2014., Body mass index is 23.22 kg/(m^2). General:  WNWD WF.  Her legs are shaking up and down as she sits in chair through our visit.  She has small laceration above right eyebrow, which has been sutured.  Neck: Supple. No thyromegaly. No lymphadenopathy. Lungs: Clear bilaterally to auscultation without wheezes, rales, or rhonchi. Breathing is unlabored. Heart: Regular rhythm. No murmurs, rubs, or gallops. Msk:  Strength and tone normal for age. Extremities/Skin: Warm and dry.  Neuro: Alert and oriented X 3. Moves all extremities spontaneously. Gait is normal. CNII-XII grossly in tact. Psych:  Her legs are shaking up and down as she sits in the chair through the entire visit.  Her hair is pulled back in a ponytail.  She has small laceration above the right eyebrow which has been sutured.      ASSESSMENT AND PLAN:  [age] y.o. year old female with  1. Depression  2. Suicide attempt  3. Head injury, subsequent encounter  4. Benzodiazepine overdose, intentional self-harm, subsequent encounter  5. Drug overdose, intentional, subsequent encounter  6. Laceration of forehead, subsequent encounter  I reviewed the notes from the hospital from her admission 03/29/2014 through 03/31/2014.  I let the patient know that I had seen the hospital records which indicated overdose on medication and that I was concerned to  prescribe any medications at this time. I asked her if she had seen psychiatry for follow-up. Answer is no. I asked if she has an appointment scheduled with psychiatry and she says no. She says that she was trying to get through with her exams then schedule that appointment. Says that her last exam is tomorrow. Says that she was just trying to get something to get rid of this headache. I again explained that I would feel more comfortable for Psychiatry to prescribe all medications right now. Encouraged her to follow  up with psychiatry.  She denies any homicidal or suicidal ideation at this time and is to follow-up with outpatient psychiatry.    [address], Mary Beth MinorcaDixon, [place], [facility] 04/05/2014 12:58 PM

## 2014-04-27 ENCOUNTER — Encounter: Payer: Self-pay | Admitting: Physician Assistant

## 2014-04-27 ENCOUNTER — Ambulatory Visit (INDEPENDENT_AMBULATORY_CARE_PROVIDER_SITE_OTHER): Payer: Medicaid Other | Admitting: Physician Assistant

## 2014-04-27 ENCOUNTER — Other Ambulatory Visit: Payer: Self-pay | Admitting: Family Medicine

## 2014-04-27 VITALS — BP 106/66 | HR 80 | Temp 98.2°F | Resp 18 | Wt 128.0 lb

## 2014-04-27 DIAGNOSIS — S0181XS Laceration without foreign body of other part of head, sequela: Secondary | ICD-10-CM

## 2014-04-27 NOTE — Telephone Encounter (Signed)
?   OK to Refill  

## 2014-04-27 NOTE — Progress Notes (Signed)
Patient ID: Oswaldo MilianKelly N Shidler MRN: [id number], DOB: 06/24/1985, 29 y.o. Date of Encounter: 04/27/2014, 10:08 AM    Chief Complaint:  Chief Complaint  Patient presents with  . post accident follow up    stitches are out, doing well     HPI: 29 y.o. year old white female says that she came in today just for me to check her laceration site. Says that there was only a couple of stitches they are so she just remove them herself. Wanted to just come in and make sure it look like it was okay.      Home Meds:   Outpatient Prescriptions Prior to Visit  Medication Sig Dispense Refill  . clonazePAM (KLONOPIN) 0.5 MG tablet Take 1 tablet (0.5 mg total) by mouth 2 (two) times daily as needed (Anxiety and benzodiazepine withdrawal). (Patient not taking: Reported on 04/27/2014) 30 tablet 0  . diphenhydrAMINE (BENADRYL) 25 mg capsule Take 1 capsule (25 mg total) by mouth every 8 (eight) hours as needed for allergies. (Patient not taking: Reported on 04/27/2014) 30 capsule 0  . EFFEXOR XR 75 MG 24 hr capsule Take 2 capsules (150 mg total) by mouth daily with breakfast. (Patient not taking: Reported on 04/27/2014) 60 capsule 5  . hydrOXYzine (ATARAX/VISTARIL) 50 MG tablet Take 1 tablet (50 mg total) by mouth at bedtime. (Patient not taking: Reported on 04/27/2014) 30 tablet 0  . ibuprofen (ADVIL,MOTRIN) 200 MG tablet Take 400 mg by mouth every 6 (six) hours as needed for moderate pain.    . norgestimate-ethinyl estradiol (ORTHO-CYCLEN,SPRINTEC,PREVIFEM) 0.25-35 MG-MCG tablet Take 1 tablet by mouth daily. (Patient not taking: Reported on 04/27/2014) 1 Package 11   No facility-administered medications prior to visit.    Allergies: No Known Allergies    Review of Systems: See HPI for pertinent ROS. All other ROS negative.    Physical Exam: Blood pressure 106/66, pulse 80, temperature 98.2 F (36.8 C), temperature source Oral, resp. rate 18, weight 128 lb (58.06 kg), last menstrual period 03/29/2014., Body mass  index is 23.41 kg/(m^2). General:  WNWD WF. Appears in no acute distress. Neck: Supple. No thyromegaly. No lymphadenopathy. Lungs: Clear bilaterally to auscultation without wheezes, rales, or rhonchi. Breathing is unlabored. Heart: Regular rhythm. No murmurs, rubs, or gallops. Msk:  Strength and tone normal for age. Skin: Right Forehead: there is a verticle scar, which is approx 3 mm width, 2 cm length--just above right eyebrow--the inferior portion of scar goes into right eyebrow.  Well healed. No infection.  Neuro: Alert and oriented X 3. Moves all extremities spontaneously. Gait is normal. CNII-XII grossly in tact. Psych:  Responds to questions appropriately with a normal affect.     ASSESSMENT AND PLAN:  29 y.o. year old female with  1. Laceration of forehead, sequela Reassured patient that the site has healed well. No signs of infection. Told her if that she is concerned about scar and cosmetic appearance she can apply coconut oil or mederma or other topical lotions to help reduce scarring.  I did review my last office visit note. Asked her if she had followed up with psychiatry and she says no. She says that she is taking no medications now. Asked her if she was okay and she says that she is fine. Says that she does not need any medications right now and does not need to see psychiatry now. Reports no suicidal ideation. Reports no homicidal ideation.   Signed, [address]Sorren Vallier Beth Jakes CornerDixon, [place], [facility] 04/27/2014 10:08 AM

## 2014-04-27 NOTE — Telephone Encounter (Signed)
Denied.  These meds need to come from psych.  SHe attempted suicide earlier and I want her psychiatrist to determine what medicines she should continue.

## 2014-04-27 NOTE — Telephone Encounter (Signed)
Refill denied 

## 2014-05-01 ENCOUNTER — Ambulatory Visit (INDEPENDENT_AMBULATORY_CARE_PROVIDER_SITE_OTHER): Payer: Medicaid Other | Admitting: Family Medicine

## 2014-05-01 ENCOUNTER — Encounter: Payer: Self-pay | Admitting: Family Medicine

## 2014-05-01 VITALS — BP 102/64 | HR 78 | Temp 98.1°F | Resp 14 | Ht 62.0 in | Wt 125.0 lb

## 2014-05-01 DIAGNOSIS — T1491 Suicide attempt: Secondary | ICD-10-CM

## 2014-05-01 DIAGNOSIS — T1491XA Suicide attempt, initial encounter: Secondary | ICD-10-CM

## 2014-05-01 DIAGNOSIS — F329 Major depressive disorder, single episode, unspecified: Secondary | ICD-10-CM

## 2014-05-01 DIAGNOSIS — F32A Depression, unspecified: Secondary | ICD-10-CM

## 2014-05-01 MED ORDER — CLONAZEPAM 0.5 MG PO TABS: 0.5000 mg | ORAL_TABLET | Freq: Two times a day (BID) | ORAL | Status: DC | PRN

## 2014-05-01 NOTE — Progress Notes (Signed)
Subjective:    Patient ID: Claudia MilianKelly N Tant, female    DOB: 05/10/1985, 29 y.o.   MRN: [id number]  HPI Please see my last office visit. After starting the Effexor, the patient spiraled out of control regarding her depression. She became hopeless. She tried to commit suicide by overdosing on Ambien. She was admitted the hospital in December and For several days. She was discharged home on Effexor, Ambien, Klonopin, Atarax. At the present time she is only taking Klonopin and Ambien. She is requesting a refill on both of these. She has not seen psychiatry. [facility], and was very dissatisfied with organization there and would like a referral to a different psychiatrist. Today she is complaining of depression. Zoloft did not work for the patient. She did not tolerate Effexor. She does not display any manic symptoms or symptoms of bipolar. She denies any auditory hallucinations or visual hallucinations. Past Medical History  Diagnosis Date  . Bartholin cyst     LEFT  . Cervical dysplasia   . Depression    Past Surgical History  Procedure Laterality Date  . Cesarean section      '05; '07  . Cholecystectomy  2011  . Cervical biopsy  w/ loop electrode excision  2012  . Orif clavicular fracture  06/10/2011    Procedure: OPEN REDUCTION INTERNAL FIXATION (ORIF) CLAVICULAR FRACTURE;  Surgeon: Cammy CopaGregory Scott Dean, MD;  [facility];  Service: Orthopedics;  Laterality: Right;  ORIF Right clavicle   Current Outpatient Prescriptions on File Prior to Visit  Medication Sig Dispense Refill  . Zolpidem Tartrate (AMBIEN PO) Take 1 capsule by mouth at bedtime as needed.     No current facility-administered medications on file prior to visit.   No Known Allergies History   Social History  . Marital Status: Single    Spouse Name: N/A    Number of Children: N/A  . Years of Education: N/A   Occupational History  . Not on file.   Social History Main Topics  . Smoking status: Former Games developermoker  .  Smokeless tobacco: Never Used  . Alcohol Use: Yes     Comment: occasional  . Drug Use: No  . Sexual Activity: Yes    Birth Control/ Protection: Pill   Other Topics Concern  . Not on file   Social History Narrative      Review of Systems  All other systems reviewed and are negative.      Objective:   Physical Exam  Cardiovascular: Normal rate and regular rhythm.   Pulmonary/Chest: Effort normal and breath sounds normal.  Psychiatric: She has a normal mood and affect. Her behavior is normal. Judgment and thought content normal.  Vitals reviewed.         Assessment & Plan:  Depression - Plan: clonazePAM (KLONOPIN) 0.5 MG tablet, Ambulatory referral to Psychiatry  Suicide attempt - Plan: Ambulatory referral to Psychiatry  Patient denies any suicidal ideation today. Her behavior, judgment, and insight seem appropriate. I will start the patient on Brintellix 5 mg a day as an antidepressant and then increase to 10 mg a day in one week. I will see the patient back in 2 weeks to recheck her or immediately if worse. I will not refill her Ambien. I will give the patient Klonopin 0.5 mg by mouth twice a day but I will limit her to 30 pills and I will not refill those pills again until she is seen in 2 weeks. In the meantime I  will try to arrange a referral to another psychiatrist..

## 2014-05-15 ENCOUNTER — Ambulatory Visit: Payer: Medicaid Other | Admitting: Family Medicine

## 2014-05-26 ENCOUNTER — Encounter: Payer: Self-pay | Admitting: Family Medicine

## 2014-05-26 ENCOUNTER — Ambulatory Visit (INDEPENDENT_AMBULATORY_CARE_PROVIDER_SITE_OTHER): Payer: Medicaid Other | Admitting: Family Medicine

## 2014-05-26 VITALS — BP 100/60 | HR 60 | Temp 98.3°F | Resp 12 | Ht 62.0 in | Wt 125.0 lb

## 2014-05-26 DIAGNOSIS — F32A Depression, unspecified: Secondary | ICD-10-CM

## 2014-05-26 DIAGNOSIS — F329 Major depressive disorder, single episode, unspecified: Secondary | ICD-10-CM

## 2014-05-26 MED ORDER — VORTIOXETINE HBR 10 MG PO TABS: 1.0000 | ORAL_TABLET | Freq: Every day | ORAL | Status: DC

## 2014-05-26 MED ORDER — CLONAZEPAM 0.5 MG PO TABS: 0.5000 mg | ORAL_TABLET | Freq: Two times a day (BID) | ORAL | Status: DC | PRN

## 2014-05-26 NOTE — Progress Notes (Signed)
Subjective:    Patient ID: [name], female    DOB: 03/31/1986, 29 y.o.   MRN: [id number]  HPI  05/01/14 Please see my last office visit. After starting the Effexor, the patient spiraled out of control regarding her depression. She became hopeless. She tried to commit suicide by overdosing on Ambien. She was admitted the hospital in December and For several days. She was discharged home on Effexor, Ambien, Klonopin, Atarax. At the present time she is only taking Klonopin and Ambien. She is requesting a refill on both of these. She has not seen psychiatry. [facility], and was very dissatisfied with organization there and would like a referral to a different psychiatrist. Today she is complaining of depression. Zoloft did not work for the patient. She did not tolerate Effexor. She does not display any manic symptoms or symptoms of bipolar. She denies any auditory hallucinations or visual hallucinations.  At that time, my plan was: Patient denies any suicidal ideation today. Her behavior, judgment, and insight seem appropriate. I will start the patient on Brintellix 5 mg a day as an antidepressant and then increase to 10 mg a day in one week. I will see the patient back in 2 weeks to recheck her or immediately if worse. I will not refill her Ambien. I will give the patient Klonopin 0.5 mg by mouth twice a day but I will limit her to 30 pills and I will not refill those pills again until she is seen in 2 weeks. In the meantime I will try to arrange a referral to another psychiatrist..    05/26/14 Patient here for follow up.  She is doing much better on Brintellix 10 mg by mouth daily. She denies any suicidal ideation or insomnia. She is using the Klonopin less frequently. She no longer wants to see a psychiatrist. She denies any manic symptoms. Overall she is doing well. She is back in school and scheduled to graduate in May. She denies any anhedonia, poor concentration, change in appetite Past  Medical History  Diagnosis Date  . Bartholin cyst     LEFT  . Cervical dysplasia   . Depression    Past Surgical History  Procedure Laterality Date  . Cesarean section      '05; '07  . Cholecystectomy  2011  . Cervical biopsy  w/ loop electrode excision  2012  . Orif clavicular fracture  06/10/2011    Procedure: OPEN REDUCTION INTERNAL FIXATION (ORIF) CLAVICULAR FRACTURE;  Surgeon: Claudia CopaGregory Scott Dean, MD;  [facility];  Service: Orthopedics;  Laterality: Right;  ORIF Right clavicle   Current Outpatient Prescriptions on File Prior to Visit  Medication Sig Dispense Refill  . clonazePAM (KLONOPIN) 0.5 MG tablet Take 1 tablet (0.5 mg total) by mouth 2 (two) times daily as needed (Anxiety and benzodiazepine withdrawal). 30 tablet 0   No current facility-administered medications on file prior to visit.   No Known Allergies History   Social History  . Marital Status: Single    Spouse Name: N/A    Number of Children: N/A  . Years of Education: N/A   Occupational History  . Not on file.   Social History Main Topics  . Smoking status: Former Games developermoker  . Smokeless tobacco: Never Used  . Alcohol Use: Yes     Comment: occasional  . Drug Use: No  . Sexual Activity: Yes    Birth Control/ Protection: Pill   Other Topics Concern  . Not on  file   Social History Narrative     Review of Systems  All other systems reviewed and are negative.      Objective:   Physical Exam  Cardiovascular: Normal rate, regular rhythm and normal heart sounds.   Pulmonary/Chest: Effort normal and breath sounds normal. No respiratory distress. She has no wheezes. She has no rales.  Abdominal: Soft. Bowel sounds are normal.  Psychiatric: She has a normal mood and affect. Her behavior is normal. Judgment and thought content normal.  Vitals reviewed.         Assessment & Plan:  Depression - Plan: Vortioxetine HBr 10 MG TABS, clonazePAM (KLONOPIN) 0.5 MG tablet  Continue Brintellix 10 mg by  mouth daily. I will refill Klonopin 0.5 mg tablets 1 by mouth daily when necessary insomnia or anxiety. I recommended she try to wean down on this medication when she is trying to do. Follow up again in 3 months or immediately if worse

## 2014-06-15 ENCOUNTER — Encounter: Payer: Self-pay | Admitting: *Deleted

## 2014-07-07 ENCOUNTER — Encounter: Payer: Self-pay | Admitting: Family Medicine

## 2014-07-07 ENCOUNTER — Ambulatory Visit (INDEPENDENT_AMBULATORY_CARE_PROVIDER_SITE_OTHER): Payer: Medicaid Other | Admitting: Family Medicine

## 2014-07-07 VITALS — BP 102/64 | HR 64 | Temp 98.2°F | Resp 16 | Ht 62.0 in | Wt 125.0 lb

## 2014-07-07 DIAGNOSIS — F329 Major depressive disorder, single episode, unspecified: Secondary | ICD-10-CM

## 2014-07-07 DIAGNOSIS — R112 Nausea with vomiting, unspecified: Secondary | ICD-10-CM | POA: Diagnosis not present

## 2014-07-07 DIAGNOSIS — F32A Depression, unspecified: Secondary | ICD-10-CM

## 2014-07-07 MED ORDER — PROMETHAZINE HCL 25 MG PO TABS: 25.0000 mg | ORAL_TABLET | Freq: Four times a day (QID) | ORAL | Status: DC | PRN

## 2014-07-07 NOTE — Progress Notes (Signed)
Subjective:    Patient ID: [name], female    DOB: 19-Mar-1986, 29 y.o.   MRN: [id number]  HPI   05/01/14 Please see my last office visit. After starting the Effexor, the patient spiraled out of control regarding her depression. She became hopeless. She tried to commit suicide by overdosing on Ambien. She was admitted the hospital in December and For several days. She was discharged home on Effexor, Ambien, Klonopin, Atarax. At the present time she is only taking Klonopin and Ambien. She is requesting a refill on both of these. She has not seen psychiatry. She went to [facility], and was very dissatisfied with organization there and would like a referral to a different psychiatrist. Today she is complaining of depression. Zoloft did not work for the patient. She did not tolerate Effexor. She does not display any manic symptoms or symptoms of bipolar. She denies any auditory hallucinations or visual hallucinations.  At that time, my plan was: Patient denies any suicidal ideation today. Her behavior, judgment, and insight seem appropriate. I will start the patient on Brintellix 5 mg a day as an antidepressant and then increase to 10 mg a day in one week. I will see the patient back in 2 weeks to recheck her or immediately if worse. I will not refill her Ambien. I will give the patient Klonopin 0.5 mg by mouth twice a day but I will limit her to 30 pills and I will not refill those pills again until she is seen in 2 weeks. In the meantime I will try to arrange a referral to another psychiatrist..    05/26/14 Patient here for follow up.  She is doing much better on Brintellix 10 mg by mouth daily. She denies any suicidal ideation or insomnia. She is using the Klonopin less frequently. She no longer wants to see a psychiatrist. She denies any manic symptoms. Overall she is doing well. She is back in school and scheduled to graduate in May. She denies any anhedonia, poor concentration, change in appetite.  At  that time, my plan was:  Continue Brintellix 10 mg by mouth daily. I will refill Klonopin 0.5 mg tablets 1 by mouth daily when necessary insomnia or anxiety. I recommended she try to wean down on this medication when she is trying to do. Follow up again in 3 months or immediately if worse  07/07/14 Unfortunately, the patient is having severe nausea on her antidepressant. She is tried taking the medication at night and she usually awakens within 2-3 hours and has to vomit. She reports nausea after taking the medication. The medication is working so well she is very concerned about stopping the medication. She would like to try decreasing the dose to see if she can tolerate the nausea better. She denies any suicidal ideation. She denies any manic symptoms. Her depression is well controlled. Past Medical History  Diagnosis Date  . Bartholin cyst     LEFT  . Cervical dysplasia   . Depression    Past Surgical History  Procedure Laterality Date  . Cesarean section      '05; '07  . Cholecystectomy  2011  . Cervical biopsy  w/ loop electrode excision  2012  . Orif clavicular fracture  06/10/2011    Procedure: OPEN REDUCTION INTERNAL FIXATION (ORIF) CLAVICULAR FRACTURE;  Surgeon: Cammy Copa, MD;  Location: [facility] OR;  Service: Orthopedics;  Laterality: Right;  ORIF Right clavicle   Current Outpatient Prescriptions on File Prior to Visit  Medication Sig Dispense Refill  . clonazePAM (KLONOPIN) 0.5 MG tablet Take 1 tablet (0.5 mg total) by mouth 2 (two) times daily as needed (Anxiety and benzodiazepine withdrawal). 30 tablet 0  . Vortioxetine HBr 10 MG TABS Take 1 tablet (10 mg total) by mouth daily. 30 tablet 5   No current facility-administered medications on file prior to visit.   No Known Allergies History   Social History  . Marital Status: Single    Spouse Name: N/A  . Number of Children: N/A  . Years of Education: N/A   Occupational History  . Not on file.   Social History Main  Topics  . Smoking status: Former Games developermoker  . Smokeless tobacco: Never Used  . Alcohol Use: Yes     Comment: occasional  . Drug Use: No  . Sexual Activity: Yes    Birth Control/ Protection: Pill   Other Topics Concern  . Not on file   Social History Narrative     Review of Systems  All other systems reviewed and are negative.      Objective:   Physical Exam  Cardiovascular: Normal rate, regular rhythm and normal heart sounds.   Pulmonary/Chest: Effort normal and breath sounds normal. No respiratory distress. She has no wheezes. She has no rales.  Abdominal: Soft. Bowel sounds are normal.  Psychiatric: She has a normal mood and affect. Her behavior is normal. Judgment and thought content normal.  Vitals reviewed.         Assessment & Plan:  Nausea and vomiting, vomiting of unspecified type - Plan: promethazine (PHENERGAN) 25 MG tablet  Depression Decrease Brintellix to 5 mg poqhs.  Also try taking Phenergan 25 mg by mouth daily at bedtime as needed for nausea. If symptoms do not improve with this strategy we could try switching the patient to Viibryd.

## 2015-02-21 ENCOUNTER — Other Ambulatory Visit: Payer: Self-pay

## 2015-02-26 LAB — CYTOLOGY - PAP

## 2015-06-22 ENCOUNTER — Other Ambulatory Visit: Payer: Self-pay | Admitting: Family Medicine

## 2015-06-22 DIAGNOSIS — N281 Cyst of kidney, acquired: Secondary | ICD-10-CM

## 2015-06-27 ENCOUNTER — Ambulatory Visit
Admission: RE | Admit: 2015-06-27 | Discharge: 2015-06-27 | Disposition: A | Discharge status: Home / Self Care | Payer: BLUE CROSS/BLUE SHIELD | Source: Ambulatory Visit | Attending: Family Medicine | Admitting: Family Medicine

## 2015-06-27 DIAGNOSIS — N281 Cyst of kidney, acquired: Secondary | ICD-10-CM

## 2015-08-30 DIAGNOSIS — N281 Cyst of kidney, acquired: Secondary | ICD-10-CM | POA: Diagnosis not present

## 2015-08-30 DIAGNOSIS — Z0000 Encounter for general adult medical examination without abnormal findings: Secondary | ICD-10-CM | POA: Diagnosis not present

## 2015-10-16 DIAGNOSIS — N281 Cyst of kidney, acquired: Secondary | ICD-10-CM | POA: Diagnosis not present

## 2015-10-17 ENCOUNTER — Other Ambulatory Visit: Payer: Self-pay | Admitting: Urology

## 2015-10-19 ENCOUNTER — Other Ambulatory Visit: Payer: Self-pay | Admitting: Urology

## 2015-10-20 DIAGNOSIS — J029 Acute pharyngitis, unspecified: Secondary | ICD-10-CM | POA: Diagnosis not present

## 2015-10-24 DIAGNOSIS — N281 Cyst of kidney, acquired: Secondary | ICD-10-CM | POA: Diagnosis not present

## 2015-11-08 DIAGNOSIS — R05 Cough: Secondary | ICD-10-CM | POA: Diagnosis not present

## 2015-11-08 DIAGNOSIS — J209 Acute bronchitis, unspecified: Secondary | ICD-10-CM | POA: Diagnosis not present

## 2015-11-29 DIAGNOSIS — H04123 Dry eye syndrome of bilateral lacrimal glands: Secondary | ICD-10-CM | POA: Diagnosis not present

## 2015-12-04 NOTE — Patient Instructions (Signed)
[name]  12/04/2015   Your procedure is scheduled on: 12/12/15  Report to [facility]  [place]  [place] to 3rd floor [facility] a [phone]  AM.  Call this number if you have problems the morning of surgery [phone] BOWEL PREP AS PER OFFICE  Remember: ONLY 1 PERSON MAY GO WITH YOU TO SHORT STAY TO GET  READY MORNING OF YOUR SURGERY.  Do not TAKE ANYTHING :After Midnight.     Take these medicines the morning of surgery with A SIP OF WATER: NONE scheduled May take Tramadol if needed with sip water DO NOT TAKE ANY DIABETIC MEDICATIONS DAY OF YOUR SURGERY                               You may not have any metal on your body including hair pins and              piercings  Do not wear jewelry, make-up, lotions, powders or perfumes, deodorant             Do not wear nail polish.  Do not shave  48 hours prior to surgery.              Men may shave face and neck.   Do not bring valuables to the hospital. [facility] IS NOT             RESPONSIBLE   FOR VALUABLES.  Contacts, dentures or bridgework may not be worn into surgery.  Leave suitcase in the car. After surgery it may be brought to your room.                - Preparing for Surgery Before surgery, you can play an important role.  Because skin is not sterile, your skin needs to be as free of germs as possible.  You can reduce the number of germs on your skin by washing with CHG (chlorahexidine gluconate) soap before surgery.  CHG is an antiseptic cleaner which kills germs and bonds with the skin to continue killing germs even after washing. Please DO NOT use if you have an allergy to CHG or antibacterial soaps.  If your skin becomes reddened/irritated stop using the CHG and inform your nurse when you arrive at Short Stay. Do not shave (including legs and underarms) for at least 48 hours prior to the first CHG shower.  You may shave your face/neck. Please follow these  instructions carefully:  1.  Shower with CHG Soap the night before surgery and the  morning of Surgery.  2.  If you choose to wash your hair, wash your hair first as usual with your  normal  shampoo.  3.  After you shampoo, rinse your hair and body thoroughly to remove the  shampoo.                           4.  Use CHG as you would any other liquid soap.  You can apply chg directly  to the skin and wash                       Gently with a scrungie or clean washcloth.  5.  Apply the CHG Soap to your body ONLY FROM THE  NECK DOWN.   Do not use on face/ open                           Wound or open sores. Avoid contact with eyes, ears mouth and genitals (private parts).                       Wash face,  Genitals (private parts) with your normal soap.             6.  Wash thoroughly, paying special attention to the area where your surgery  will be performed.  7.  Thoroughly rinse your body with warm water from the neck down.  8.  DO NOT shower/wash with your normal soap after using and rinsing off  the CHG Soap.                9.  Pat yourself dry with a clean towel.            10.  Wear clean pajamas.            11.  Place clean sheets on your bed the night of your first shower and do not  sleep with pets. Day of Surgery : Do not apply any lotions/deodorants the morning of surgery.  Please wear clean clothes to the hospital/surgery center.  FAILURE TO FOLLOW THESE INSTRUCTIONS MAY RESULT IN THE CANCELLATION OF YOUR SURGERY PATIENT SIGNATURE_________________________________  NURSE SIGNATURE__________________________________  ________________________________________________________________________    CLEAR LIQUID DIET   Foods Allowed                                                                     Foods Excluded  Coffee and tea, regular and decaf                             liquids that you cannot  Plain Jell-O in any flavor                                             see through such  as: Fruit ices (not with fruit pulp)                                     milk, soups, orange juice  Iced Popsicles                                    All solid food Carbonated beverages, regular and diet                                    Cranberry, grape and apple juices Sports drinks like Gatorade Lightly seasoned clear broth or consume(fat free) Sugar, honey syrup  Sample Menu Breakfast  Lunch                                     Supper Cranberry juice                    Beef broth                            Chicken broth Jell-O                                     Grape juice                           Apple juice Coffee or tea                        Jell-O                                      Popsicle                                                Coffee or tea                        Coffee or tea  _____________________________________________________________________

## 2015-12-04 NOTE — Progress Notes (Signed)
Alda Ponderlov J Edward FNP, chest  7/17 chart

## 2015-12-05 ENCOUNTER — Encounter (HOSPITAL_COMMUNITY)
Admission: RE | Admit: 2015-12-05 | Discharge: 2015-12-05 | Disposition: A | Discharge status: Home / Self Care | Payer: BLUE CROSS/BLUE SHIELD | Source: Ambulatory Visit | Attending: Urology | Admitting: Urology

## 2015-12-05 ENCOUNTER — Encounter (HOSPITAL_COMMUNITY): Payer: Self-pay

## 2015-12-05 DIAGNOSIS — Z01812 Encounter for preprocedural laboratory examination: Secondary | ICD-10-CM | POA: Diagnosis not present

## 2015-12-05 LAB — CBC
HEMATOCRIT: 35.8 % — AB (ref 36.0–46.0)
HEMOGLOBIN: 11.9 g/dL — AB (ref 12.0–15.0)
MCH: 31.1 pg (ref 26.0–34.0)
MCHC: 33.2 g/dL (ref 30.0–36.0)
MCV: 93.5 fL (ref 78.0–100.0)
Platelets: 279 10*3/uL (ref 150–400)
RBC: 3.83 MIL/uL — ABNORMAL LOW (ref 3.87–5.11)
RDW: 12.7 % (ref 11.5–15.5)
WBC: 8.1 10*3/uL (ref 4.0–10.5)

## 2015-12-05 LAB — HCG, SERUM, QUALITATIVE: Preg, Serum: NEGATIVE

## 2015-12-05 LAB — ABO/RH: ABO/RH(D): A POS

## 2015-12-05 NOTE — Progress Notes (Signed)
States has bowel prep instructions 

## 2015-12-06 ENCOUNTER — Encounter: Payer: Self-pay | Admitting: Family

## 2015-12-06 ENCOUNTER — Ambulatory Visit (INDEPENDENT_AMBULATORY_CARE_PROVIDER_SITE_OTHER): Payer: BLUE CROSS/BLUE SHIELD | Admitting: Family

## 2015-12-06 VITALS — BP 122/78 | HR 77 | Temp 98.3°F | Resp 16 | Ht 62.0 in | Wt 134.0 lb

## 2015-12-06 DIAGNOSIS — F32A Depression, unspecified: Secondary | ICD-10-CM

## 2015-12-06 DIAGNOSIS — F329 Major depressive disorder, single episode, unspecified: Secondary | ICD-10-CM | POA: Diagnosis not present

## 2015-12-06 DIAGNOSIS — N2889 Other specified disorders of kidney and ureter: Secondary | ICD-10-CM

## 2015-12-06 DIAGNOSIS — R911 Solitary pulmonary nodule: Secondary | ICD-10-CM

## 2015-12-06 NOTE — Assessment & Plan Note (Signed)
Lung nodule noted on CT scan for her renal mass with the recommendation of follow up CT scan in 3 months. It is reassuring no significant history of malignancy or risk factors and the well-circumscribed. No current symptoms. Continue to monitor.

## 2015-12-06 NOTE — Patient Instructions (Addendum)
Thank you for choosing [facility].  Summary/Instructions:  Follow up with Dr. Berneice HeinrichManny.  We will schedule a CT scan in 3 months.   Schedule a time for a physical at your convenience.   If your symptoms worsen or fail to improve, please contact our office for further instruction, or in case of emergency go directly to the emergency room at the closest medical facility.

## 2015-12-06 NOTE — Assessment & Plan Note (Signed)
Depression is stable and not maintained on medication. Denies any suicidal ideations or dysphoric moods. Continue to monitor.

## 2015-12-06 NOTE — Progress Notes (Signed)
Subjective:    Patient ID: [name], female    DOB: 08/07/1985, 30 y.o.   MRN: [id number]  Chief Complaint  Patient presents with  . Establish Care    having surgery next week to get a cyst removed from kidney, had a ct scan done that she was told found something on her lung    HPI:  Claudia MilianKelly N Lambeth is a 30 y.o. female who  has a past medical history of Bartholin cyst; Cervical dysplasia; and Depression. and presents today for an office visit to establish care.    1.) Pulmonary nodule - Recently evaluated for a renal mass and on a CT scan was noted to have a pulmonary nodule located in her right lower lobe with a size of 9 x 10 mm. It was well circumscribed and believed to be bengin with recommended follow up CT in 3 months. Not currently expereicning and fevers, chills, shortness of breath, cough, night sweats or weight loss.  2.) Renal  Mass - Previously noted to have a renal mass during a CT scan prior to her cholecyscectomy. Experiencing the associated symptom of discomfort located on her left side and flank. Denies urinary symptoms. Scheduled to undergo surgery in 1 week by Dr. Berneice HeinrichManny of Urology.   3.) Depression - Stable without any medications. Denies sleep disturbance or dysphoric mood. No thoughts of suicide.    No Known Allergies   Outpatient Medications Prior to Visit  Medication Sig Dispense Refill  . norgestimate-ethinyl estradiol (ORTHO-CYCLEN,SPRINTEC,PREVIFEM) 0.25-35 MG-MCG tablet Take 1 tablet by mouth daily.    . traMADol (ULTRAM) 50 MG tablet Take 50 mg by mouth every 6 (six) hours as needed for moderate pain.     No facility-administered medications prior to visit.      Past Medical History:  Diagnosis Date  . Bartholin cyst    LEFT  . Cervical dysplasia   . Depression      Past Surgical History:  Procedure Laterality Date  . CERVICAL BIOPSY  W/ LOOP ELECTRODE EXCISION  2012  . CESAREAN SECTION     '05; '07  . CHOLECYSTECTOMY  2011  . ORIF  CLAVICULAR FRACTURE  06/10/2011   Procedure: OPEN REDUCTION INTERNAL FIXATION (ORIF) CLAVICULAR FRACTURE;  Surgeon: Cammy CopaGregory Scott Dean, MD;  [facility];  Service: Orthopedics;  Laterality: Right;  ORIF Right clavicle     Family History  Problem Relation Age of Onset  . Healthy Mother   . Diabetes Father   . Cancer Paternal Grandmother     CERVICAL  . Prostate cancer Paternal Grandfather      Social History   Social History  . Marital status: Single    Spouse name: N/A  . Number of children: 2  . Years of education: 5718   Occupational History  . Lawyer    Social History Main Topics  . Smoking status: Former Smoker    Packs/day: 0.25    Years: 2.00    Quit date: 12/05/2011  . Smokeless tobacco: Never Used  . Alcohol use Yes     Comment: 2-3 glasses wine weekly  . Drug use: No  . Sexual activity: Yes    Birth control/ protection: Pill   Other Topics Concern  . Not on file   Social History Narrative   Fun: Work and hang out with children   Denies abuse and feels safe at home.      Review of Systems  Constitutional: Negative for chills and fever.  Respiratory:  Negative for cough, chest tightness, shortness of breath, wheezing and stridor.   Cardiovascular: Negative for chest pain, palpitations and leg swelling.  Genitourinary: Positive for flank pain. Negative for dysuria, frequency and urgency.      Objective:    BP 122/78 (BP Location: Left Arm, Patient Position: Sitting, Cuff Size: Normal)   Pulse 77   Temp 98.3 F (36.8 C) (Oral)   Resp 16   Ht 5\' 2"  (1.575 m)   Wt 134 lb (60.8 kg)   LMP 11/14/2015   SpO2 99%   BMI 24.51 kg/m  Nursing note and vital signs reviewed.  Physical Exam  Constitutional: She is oriented to person, place, and time. She appears well-developed and well-nourished. No distress.  Cardiovascular: Normal rate, regular rhythm, normal heart sounds and intact distal pulses.   Pulmonary/Chest: Effort normal and breath sounds  normal.  Abdominal: There is CVA tenderness.  Neurological: She is alert and oriented to person, place, and time.  Skin: Skin is warm and dry.  Psychiatric: She has a normal mood and affect. Her behavior is normal. Judgment and thought content normal.       Assessment & Plan:   Problem List Items Addressed This Visit      Other   Depression - Primary    Depression is stable and not maintained on medication. Denies any suicidal ideations or dysphoric moods. Continue to monitor.       Renal mass    Renal mass with scheduled surgery on 8/23 by Dr. Berneice HeinrichManny. Continue to monitor pending surgery results.       Lung nodule    Lung nodule noted on CT scan for her renal mass with the recommendation of follow up CT scan in 3 months. It is reassuring no significant history of malignancy or risk factors and the well-circumscribed. No current symptoms. Continue to monitor.        Other Visit Diagnoses   None.      I am having Ms. Mccuistion maintain her norgestimate-ethinyl estradiol and traMADol.   Follow-up: Return in about 3 months (around 03/07/2016), or if symptoms worsen or fail to improve.  Jeanine Luzalone, Gregory, FNP

## 2015-12-06 NOTE — Assessment & Plan Note (Signed)
Renal mass with scheduled surgery on 8/23 by Dr. Berneice HeinrichManny. Continue to monitor pending surgery results.

## 2015-12-12 ENCOUNTER — Encounter (HOSPITAL_COMMUNITY): Payer: Self-pay | Admitting: *Deleted

## 2015-12-12 ENCOUNTER — Inpatient Hospital Stay (HOSPITAL_COMMUNITY)
Admission: RE | Admit: 2015-12-12 | Discharge: 2015-12-13 | DRG: 661 | Disposition: A | Discharge status: Home / Self Care | Payer: BLUE CROSS/BLUE SHIELD | Source: Ambulatory Visit | Attending: Urology | Admitting: Urology

## 2015-12-12 ENCOUNTER — Inpatient Hospital Stay (HOSPITAL_COMMUNITY): Payer: BLUE CROSS/BLUE SHIELD | Admitting: Anesthesiology

## 2015-12-12 ENCOUNTER — Encounter (HOSPITAL_COMMUNITY)
Admission: RE | Disposition: A | Discharge status: Home / Self Care | Payer: Self-pay | Source: Ambulatory Visit | Attending: Urology

## 2015-12-12 DIAGNOSIS — Z79899 Other long term (current) drug therapy: Secondary | ICD-10-CM | POA: Diagnosis not present

## 2015-12-12 DIAGNOSIS — F329 Major depressive disorder, single episode, unspecified: Secondary | ICD-10-CM | POA: Diagnosis present

## 2015-12-12 DIAGNOSIS — Z87891 Personal history of nicotine dependence: Secondary | ICD-10-CM

## 2015-12-12 DIAGNOSIS — N281 Cyst of kidney, acquired: Secondary | ICD-10-CM | POA: Diagnosis not present

## 2015-12-12 DIAGNOSIS — F419 Anxiety disorder, unspecified: Secondary | ICD-10-CM | POA: Diagnosis not present

## 2015-12-12 HISTORY — PX: LAPAROSCOPIC REMOVAL RENAL CYST: SHX5918

## 2015-12-12 LAB — BASIC METABOLIC PANEL
Anion gap: 5 (ref 5–15)
BUN: 5 mg/dL — ABNORMAL LOW (ref 6–20)
CHLORIDE: 109 mmol/L (ref 101–111)
CO2: 24 mmol/L (ref 22–32)
Calcium: 7.8 mg/dL — ABNORMAL LOW (ref 8.9–10.3)
Creatinine, Ser: 0.58 mg/dL (ref 0.44–1.00)
GFR calc Af Amer: >60 mL/min (ref >60)
GFR calc non Af Amer: >60 mL/min (ref >60)
Glucose, Bld: 123 mg/dL — ABNORMAL HIGH (ref 65–99)
Potassium: 3.8 mmol/L (ref 3.5–5.1)
SODIUM: 138 mmol/L (ref 135–145)

## 2015-12-12 LAB — HEMOGLOBIN AND HEMATOCRIT, BLOOD
HEMATOCRIT: 33.3 % — AB (ref 36.0–46.0)
Hemoglobin: 11 g/dL — ABNORMAL LOW (ref 12.0–15.0)

## 2015-12-12 LAB — TYPE AND SCREEN
ABO/RH(D): A POS
ANTIBODY SCREEN: NEGATIVE

## 2015-12-12 SURGERY — EXCISION, CYST, KIDNEY, LAPAROSCOPIC
Anesthesia: General | Laterality: Left

## 2015-12-12 MED ORDER — CEFAZOLIN SODIUM-DEXTROSE 2-4 GM/100ML-% IV SOLN
INTRAVENOUS | Status: AC
  Filled 2015-12-12: qty 100

## 2015-12-12 MED ORDER — ONDANSETRON HCL 4 MG/2ML IJ SOLN: 4.0000 mg | Freq: Four times a day (QID) | INTRAMUSCULAR | Status: DC | PRN

## 2015-12-12 MED ORDER — PROPOFOL 10 MG/ML IV BOLUS
INTRAVENOUS | Status: DC | PRN
Start: 2015-12-12 — End: 2015-12-12
  Administered 2015-12-12: 150 mg via INTRAVENOUS

## 2015-12-12 MED ORDER — BUPIVACAINE LIPOSOME 1.3 % IJ SUSP
INTRAMUSCULAR | Status: DC | PRN
  Administered 2015-12-12: 20 mL

## 2015-12-12 MED ORDER — ONDANSETRON HCL 4 MG/2ML IJ SOLN
INTRAMUSCULAR | Status: DC | PRN
Start: 2015-12-12 — End: 2015-12-12
  Administered 2015-12-12: 4 mg via INTRAVENOUS

## 2015-12-12 MED ORDER — OXYCODONE HCL 5 MG/5ML PO SOLN: 5.0000 mg | Freq: Once | ORAL | Status: DC | PRN

## 2015-12-12 MED ORDER — FENTANYL CITRATE (PF) 100 MCG/2ML IJ SOLN
INTRAMUSCULAR | Status: AC
Start: 2015-12-12 — End: 2015-12-12
  Filled 2015-12-12: qty 2

## 2015-12-12 MED ORDER — HYDROMORPHONE HCL 1 MG/ML IJ SOLN
0.2500 mg | INTRAMUSCULAR | Status: DC | PRN
  Administered 2015-12-12 (×4): 0.5 mg via INTRAVENOUS

## 2015-12-12 MED ORDER — STERILE WATER FOR IRRIGATION IR SOLN
Status: DC | PRN
  Administered 2015-12-12: 1000 mL

## 2015-12-12 MED ORDER — HYDROCODONE-ACETAMINOPHEN 5-325 MG PO TABS: 1.0000 | ORAL_TABLET | ORAL | 0 refills | Status: DC | PRN

## 2015-12-12 MED ORDER — SUGAMMADEX SODIUM 200 MG/2ML IV SOLN
INTRAVENOUS | Status: AC
  Filled 2015-12-12: qty 2

## 2015-12-12 MED ORDER — BUPIVACAINE LIPOSOME 1.3 % IJ SUSP
INTRAMUSCULAR | Status: AC
  Filled 2015-12-12: qty 20

## 2015-12-12 MED ORDER — DOCUSATE SODIUM 100 MG PO CAPS
100.0000 mg | ORAL_CAPSULE | Freq: Two times a day (BID) | ORAL | Status: DC
  Administered 2015-12-12 – 2015-12-13 (×2): 100 mg via ORAL
  Filled 2015-12-12 (×2): qty 1

## 2015-12-12 MED ORDER — OXYCODONE HCL 5 MG PO TABS
10.0000 mg | ORAL_TABLET | ORAL | Status: DC | PRN
  Administered 2015-12-12 – 2015-12-13 (×4): 10 mg via ORAL
  Filled 2015-12-12 (×5): qty 2

## 2015-12-12 MED ORDER — OXYCODONE HCL 5 MG PO TABS: 5.0000 mg | ORAL_TABLET | ORAL | Status: DC | PRN

## 2015-12-12 MED ORDER — DEXAMETHASONE SODIUM PHOSPHATE 10 MG/ML IJ SOLN
INTRAMUSCULAR | Status: AC
  Filled 2015-12-12: qty 1

## 2015-12-12 MED ORDER — LACTATED RINGERS IV SOLN
INTRAVENOUS | Status: DC
  Administered 2015-12-12 – 2015-12-13 (×3): via INTRAVENOUS

## 2015-12-12 MED ORDER — SUGAMMADEX SODIUM 200 MG/2ML IV SOLN
INTRAVENOUS | Status: DC | PRN
  Administered 2015-12-12: 150 mg via INTRAVENOUS

## 2015-12-12 MED ORDER — ONDANSETRON HCL 4 MG/2ML IJ SOLN
INTRAMUSCULAR | Status: AC
  Filled 2015-12-12: qty 2

## 2015-12-12 MED ORDER — HYDROMORPHONE HCL 1 MG/ML IJ SOLN
INTRAMUSCULAR | Status: AC
  Filled 2015-12-12: qty 1

## 2015-12-12 MED ORDER — PHENYLEPHRINE HCL 10 MG/ML IJ SOLN
INTRAMUSCULAR | Status: DC | PRN
  Administered 2015-12-12: 40 ug via INTRAVENOUS

## 2015-12-12 MED ORDER — FENTANYL CITRATE (PF) 100 MCG/2ML IJ SOLN
INTRAMUSCULAR | Status: AC
  Filled 2015-12-12: qty 2

## 2015-12-12 MED ORDER — LIDOCAINE HCL (CARDIAC) 20 MG/ML IV SOLN
INTRAVENOUS | Status: AC
  Filled 2015-12-12: qty 5

## 2015-12-12 MED ORDER — DEXAMETHASONE SODIUM PHOSPHATE 10 MG/ML IJ SOLN
INTRAMUSCULAR | Status: DC | PRN
  Administered 2015-12-12: 10 mg via INTRAVENOUS

## 2015-12-12 MED ORDER — NORGESTIMATE-ETH ESTRADIOL 0.25-35 MG-MCG PO TABS: 1.0000 | ORAL_TABLET | Freq: Every day | ORAL | Status: DC

## 2015-12-12 MED ORDER — LIDOCAINE HCL (CARDIAC) 20 MG/ML IV SOLN
INTRAVENOUS | Status: DC | PRN
  Administered 2015-12-12: 60 mg via INTRAVENOUS

## 2015-12-12 MED ORDER — LACTATED RINGERS IV SOLN
INTRAVENOUS | Status: DC | PRN
  Administered 2015-12-12 (×2): via INTRAVENOUS

## 2015-12-12 MED ORDER — OXYCODONE HCL 5 MG PO TABS: 5.0000 mg | ORAL_TABLET | Freq: Once | ORAL | Status: DC | PRN

## 2015-12-12 MED ORDER — MIDAZOLAM HCL 2 MG/2ML IJ SOLN
INTRAMUSCULAR | Status: AC
  Filled 2015-12-12: qty 2

## 2015-12-12 MED ORDER — ROCURONIUM BROMIDE 100 MG/10ML IV SOLN
INTRAVENOUS | Status: DC | PRN
  Administered 2015-12-12: 5 mg via INTRAVENOUS
  Administered 2015-12-12: 50 mg via INTRAVENOUS
  Administered 2015-12-12: 10 mg via INTRAVENOUS

## 2015-12-12 MED ORDER — SODIUM CHLORIDE 0.9 % IJ SOLN
INTRAMUSCULAR | Status: AC
  Filled 2015-12-12: qty 20

## 2015-12-12 MED ORDER — HYDROMORPHONE HCL 1 MG/ML IJ SOLN
0.5000 mg | INTRAMUSCULAR | Status: DC | PRN
  Administered 2015-12-12 (×5): 1 mg via INTRAVENOUS
  Administered 2015-12-12: 0.5 mg via INTRAVENOUS
  Administered 2015-12-13 (×2): 1 mg via INTRAVENOUS
  Filled 2015-12-12 (×7): qty 1

## 2015-12-12 MED ORDER — MIDAZOLAM HCL 5 MG/5ML IJ SOLN
INTRAMUSCULAR | Status: DC | PRN
  Administered 2015-12-12: 2 mg via INTRAVENOUS

## 2015-12-12 MED ORDER — SODIUM CHLORIDE 0.9 % IJ SOLN
INTRAMUSCULAR | Status: DC | PRN
  Administered 2015-12-12: 20 mL

## 2015-12-12 MED ORDER — ACETAMINOPHEN 500 MG PO TABS
1000.0000 mg | ORAL_TABLET | Freq: Four times a day (QID) | ORAL | Status: AC
  Administered 2015-12-12 – 2015-12-13 (×4): 1000 mg via ORAL
  Filled 2015-12-12 (×4): qty 2

## 2015-12-12 MED ORDER — EPHEDRINE SULFATE 50 MG/ML IJ SOLN
INTRAMUSCULAR | Status: AC
  Filled 2015-12-12: qty 1

## 2015-12-12 MED ORDER — SODIUM CHLORIDE 0.9 % IJ SOLN
INTRAMUSCULAR | Status: AC
  Filled 2015-12-12: qty 10

## 2015-12-12 MED ORDER — ONDANSETRON HCL 4 MG/2ML IJ SOLN
4.0000 mg | INTRAMUSCULAR | Status: DC | PRN
  Administered 2015-12-12 (×2): 4 mg via INTRAVENOUS
  Filled 2015-12-12 (×2): qty 2

## 2015-12-12 MED ORDER — FENTANYL CITRATE (PF) 100 MCG/2ML IJ SOLN
INTRAMUSCULAR | Status: DC | PRN
  Administered 2015-12-12: 50 ug via INTRAVENOUS
  Administered 2015-12-12 (×4): 100 ug via INTRAVENOUS
  Administered 2015-12-12: 50 ug via INTRAVENOUS

## 2015-12-12 MED ORDER — ROCURONIUM BROMIDE 100 MG/10ML IV SOLN
INTRAVENOUS | Status: AC
  Filled 2015-12-12: qty 1

## 2015-12-12 MED ORDER — LACTATED RINGERS IR SOLN
Status: DC | PRN
  Administered 2015-12-12: 1

## 2015-12-12 MED ORDER — CEFAZOLIN SODIUM-DEXTROSE 2-4 GM/100ML-% IV SOLN
2.0000 g | INTRAVENOUS | Status: AC
  Administered 2015-12-12: 2 g via INTRAVENOUS
  Filled 2015-12-12: qty 100

## 2015-12-12 MED ORDER — PROPOFOL 10 MG/ML IV BOLUS
INTRAVENOUS | Status: AC
  Filled 2015-12-12: qty 20

## 2015-12-12 SURGICAL SUPPLY — 65 items
APL ESCP 34 STRL LF DISP (HEMOSTASIS) ×1
APPLICATOR SURGIFLO ENDO (HEMOSTASIS) ×2 IMPLANT
BAG SPEC RTRVL LRG 6X4 10 (ENDOMECHANICALS) ×1
CHLORAPREP W/TINT 26ML (MISCELLANEOUS) ×2 IMPLANT
CLIP LIGATING HEM O LOK PURPLE (MISCELLANEOUS) ×2 IMPLANT
CLIP LIGATING HEMO LOK XL GOLD (MISCELLANEOUS) IMPLANT
CLIP LIGATING HEMO O LOK GREEN (MISCELLANEOUS) ×4 IMPLANT
CLIP SUT LAPRA TY ABSORB (SUTURE) ×2 IMPLANT
COVER TIP SHEARS 8 DVNC (MISCELLANEOUS) ×1 IMPLANT
COVER TIP SHEARS 8MM DA VINCI (MISCELLANEOUS) ×1
DECANTER SPIKE VIAL GLASS SM (MISCELLANEOUS) ×2 IMPLANT
DRAIN CHANNEL 15F RND FF 3/16 (WOUND CARE) ×2 IMPLANT
DRAPE COLUMN DVNC XI (DISPOSABLE) ×1 IMPLANT
DRAPE DA VINCI XI COLUMN (DISPOSABLE) ×1
DRAPE INCISE IOBAN 66X45 STRL (DRAPES) ×2 IMPLANT
DRAPE LAPAROSCOPIC ABDOMINAL (DRAPES) ×2 IMPLANT
DRAPE SHEET LG 3/4 BI-LAMINATE (DRAPES) ×2 IMPLANT
ELECT PENCIL ROCKER SW 15FT (MISCELLANEOUS) ×2 IMPLANT
ELECT REM PT RETURN 9FT ADLT (ELECTROSURGICAL) ×2
ELECTRODE REM PT RTRN 9FT ADLT (ELECTROSURGICAL) ×1 IMPLANT
EVACUATOR SILICONE 100CC (DRAIN) ×2 IMPLANT
GLOVE BIO SURGEON STRL SZ 6.5 (GLOVE) ×2 IMPLANT
GLOVE BIOGEL M STRL SZ7.5 (GLOVE) ×4 IMPLANT
GOWN STRL REUS W/TWL LRG LVL3 (GOWN DISPOSABLE) ×4 IMPLANT
HEMOSTAT SURGICEL 4X8 (HEMOSTASIS) ×2 IMPLANT
IRRIG SUCT STRYKERFLOW 2 WTIP (MISCELLANEOUS)
IRRIGATION SUCT STRKRFLW 2 WTP (MISCELLANEOUS) IMPLANT
KIT BASIN OR (CUSTOM PROCEDURE TRAY) ×2 IMPLANT
LIQUID BAND (GAUZE/BANDAGES/DRESSINGS) ×2 IMPLANT
LOOP VESSEL MAXI BLUE (MISCELLANEOUS) ×2 IMPLANT
MARKER SKIN DUAL TIP RULER LAB (MISCELLANEOUS) ×2 IMPLANT
NEEDLE INSUFFLATION 14GA 120MM (NEEDLE) ×2 IMPLANT
NS IRRIG 1000ML POUR BTL (IV SOLUTION) ×2 IMPLANT
PORT ACCESS TROCAR AIRSEAL 12 (TROCAR) ×1 IMPLANT
PORT ACCESS TROCAR AIRSEAL 5M (TROCAR) ×1
POSITIONER SURGICAL ARM (MISCELLANEOUS) ×4 IMPLANT
POUCH SPECIMEN RETRIEVAL 10MM (ENDOMECHANICALS) ×2 IMPLANT
RELOAD STAPLER WHITE 60MM (STAPLE) IMPLANT
SET TRI-LUMEN FLTR TB AIRSEAL (TUBING) ×2 IMPLANT
SOLUTION ELECTROLUBE (MISCELLANEOUS) ×2 IMPLANT
SPONGE LAP 4X18 X RAY DECT (DISPOSABLE) ×2 IMPLANT
STAPLE ECHEON FLEX 60 POW ENDO (STAPLE) IMPLANT
STAPLER RELOAD WHITE 60MM (STAPLE)
SURGIFLO W/THROMBIN 8M KIT (HEMOSTASIS) ×2 IMPLANT
SUT ETHILON 3 0 PS 1 (SUTURE) ×2 IMPLANT
SUT MNCRL AB 4-0 PS2 18 (SUTURE) ×4 IMPLANT
SUT PDS AB 1 CT1 27 (SUTURE) ×4 IMPLANT
SUT PROLENE 0 CT 1 30 (SUTURE) ×4 IMPLANT
SUT V-LOC BARB 180 2/0GR6 GS22 (SUTURE)
SUT VIC AB 0 CT1 27 (SUTURE) ×8
SUT VIC AB 0 CT1 27XBRD ANTBC (SUTURE) ×4 IMPLANT
SUT VIC AB 2-0 SH 27 (SUTURE) ×4
SUT VIC AB 2-0 SH 27X BRD (SUTURE) ×2 IMPLANT
SUT VLOC BARB 180 ABS3/0GR12 (SUTURE) ×2
SUTURE V-LC BRB 180 2/0GR6GS22 (SUTURE) IMPLANT
SUTURE VLOC BRB 180 ABS3/0GR12 (SUTURE) ×1 IMPLANT
TAPE STRIPS DRAPE STRL (GAUZE/BANDAGES/DRESSINGS) ×2 IMPLANT
TOWEL OR 17X26 10 PK STRL BLUE (TOWEL DISPOSABLE) ×4 IMPLANT
TOWEL OR NON WOVEN STRL DISP B (DISPOSABLE) ×2 IMPLANT
TRAY FOLEY W/METER SILVER 14FR (SET/KITS/TRAYS/PACK) ×2 IMPLANT
TRAY FOLEY W/METER SILVER 16FR (SET/KITS/TRAYS/PACK) ×2 IMPLANT
TRAY LAPAROSCOPIC (CUSTOM PROCEDURE TRAY) ×2 IMPLANT
TROCAR BLADELESS OPT 5 100 (ENDOMECHANICALS) IMPLANT
TROCAR XCEL 12X100 BLDLESS (ENDOMECHANICALS) ×2 IMPLANT
WATER STERILE IRR 1500ML POUR (IV SOLUTION) ×4 IMPLANT

## 2015-12-12 NOTE — Transfer of Care (Signed)
Immediate Anesthesia Transfer of Care Note  Patient: [name]  Procedure(s) Performed: Procedure(s): XI ROBOTIC LEFT RENAL CYST DECORTICATION WITH LEFT NEPHROPEXY (Left)  Patient Location: PACU  Anesthesia Type:General  Level of Consciousness: awake and alert   Airway & Oxygen Therapy: Patient Spontanous Breathing and Patient connected to face mask oxygen  Post-op Assessment: Report given to RN and Post -op Vital signs reviewed and stable  Post vital signs: Reviewed and stable  Last Vitals:  Vitals:   12/12/15 0629 12/12/15 1100  BP: 116/85   Pulse: 90   Resp: 16 20  Temp: 36.9 C 36.9 C    Last Pain:  Vitals:   12/12/15 0629  TempSrc: Oral         Complications: No apparent anesthesia complications

## 2015-12-12 NOTE — Anesthesia Preprocedure Evaluation (Addendum)
Anesthesia Evaluation  Patient identified by MRN, date of birth, ID band Patient awake    Reviewed: Allergy & Precautions, H&P , NPO status , Patient's Chart, lab work & pertinent test results  Airway Mallampati: II       Dental   Pulmonary former smoker,    breath sounds clear to auscultation       Cardiovascular negative cardio ROS   Rhythm:regular Rate:Normal     Neuro/Psych PSYCHIATRIC DISORDERS Depression    GI/Hepatic   Endo/Other    Renal/GU Large left renal cyst     Musculoskeletal   Abdominal   Peds  Hematology   Anesthesia Other Findings   Reproductive/Obstetrics                             Anesthesia Physical Anesthesia Plan  ASA: II  Anesthesia Plan: General   Post-op Pain Management:    Induction: Intravenous  Airway Management Planned: Oral ETT  Additional Equipment:   Intra-op Plan:   Post-operative Plan: Extubation in OR  Informed Consent: I have reviewed the patients History and Physical, chart, labs and discussed the procedure including the risks, benefits and alternatives for the proposed anesthesia with the patient or authorized representative who has indicated his/her understanding and acceptance.     Plan Discussed with: CRNA, Anesthesiologist and Surgeon  Anesthesia Plan Comments:         Anesthesia Quick Evaluation

## 2015-12-12 NOTE — Anesthesia Postprocedure Evaluation (Signed)
Anesthesia Post Note  Patient: Oswaldo MilianKelly N Conant  Procedure(s) Performed: Procedure(s) (LRB): XI ROBOTIC LEFT RENAL CYST DECORTICATION WITH LEFT NEPHROPEXY (Left)  Patient location during evaluation: PACU Anesthesia Type: General Level of consciousness: awake and alert Pain management: pain level controlled Vital Signs Assessment: post-procedure vital signs reviewed and stable Respiratory status: spontaneous breathing, nonlabored ventilation, respiratory function stable and patient connected to nasal cannula oxygen Cardiovascular status: blood pressure returned to baseline and stable Postop Assessment: no signs of nausea or vomiting Anesthetic complications: no    Last Vitals:  Vitals:   12/12/15 1200 12/12/15 1213  BP: 122/84 118/80  Pulse: 98 (!) 104  Resp: 14 13  Temp: 37.1 C     Last Pain:  Vitals:   12/12/15 1200  TempSrc:   PainSc: 4                  Pama Roskos,W. EDMOND

## 2015-12-12 NOTE — Progress Notes (Signed)
Assumed care of patient at 1500.  Agree with previous RN's assessment of patient.  Ambulated patient x2 around unit.  Tolerated very well.  Family at bedside.  Will continue to monitor.

## 2015-12-12 NOTE — H&P (Signed)
[name] is an 30 y.o. female.    Chief Complaint: Pre-op Robotic LEFT renal cyst decortication  HPI:   1 - Large Left Renal Cyst - 10cm left upper pole non-complex cyst by US 06/2015.  No additional cysts or h/o familial cancer syndrome or renal failure. Does have some left sided fullness mild flank discomfort that comes / goes overall modest bother. 2 artery (small lower pole accessory with origin just below main artery) 1 vein (prominant adrenal branch noted) renovascular anatomy.   PMH sig for anxiety / depression (benzos, SSRI, prior overdose). Her PCP is Shirlean Mylararol Webb MD.   Today "[name]" is seen to proceed with minimally invasive dicortication of large left renal cyst.    Past Medical History:  Diagnosis Date  . Bartholin cyst    LEFT  . Cervical dysplasia   . Depression     Past Surgical History:  Procedure Laterality Date  . CERVICAL BIOPSY  W/ LOOP ELECTRODE EXCISION  2012  . CESAREAN SECTION     '05; '07  . CHOLECYSTECTOMY  2011  . ORIF CLAVICULAR FRACTURE  06/10/2011   Procedure: OPEN REDUCTION INTERNAL FIXATION (ORIF) CLAVICULAR FRACTURE;  Surgeon: Cammy CopaGregory Scott Dean, MD;  [facility];  Service: Orthopedics;  Laterality: Right;  ORIF Right clavicle    Family History  Problem Relation Age of Onset  . Healthy Mother   . Diabetes Father   . Cancer Paternal Grandmother     CERVICAL  . Prostate cancer Paternal Grandfather    Social History:  reports that she quit smoking about 4 years ago. She has a 0.50 pack-year smoking history. She has never used smokeless tobacco. She reports that she drinks alcohol. She reports that she does not use drugs.  Allergies: No Known Allergies  Medications Prior to Admission  Medication Sig Dispense Refill  . norgestimate-ethinyl estradiol (ORTHO-CYCLEN,SPRINTEC,PREVIFEM) 0.25-35 MG-MCG tablet Take 1 tablet by mouth daily.    . traMADol (ULTRAM) 50 MG tablet Take 50 mg by mouth every 6 (six) hours as needed for moderate pain.       No results found for this or any previous visit (from the past 48 hour(s)). No results found.  Review of Systems  Constitutional: Negative.   HENT: Negative.   Eyes: Negative.   Respiratory: Negative.   Cardiovascular: Negative.   Gastrointestinal: Negative.   Genitourinary: Positive for flank pain.  Skin: Negative.   Neurological: Negative.   Endo/Heme/Allergies: Negative.   Psychiatric/Behavioral: Negative.     Blood pressure 116/85, pulse 90, temperature 98.4 F (36.9 C), temperature source Oral, resp. rate 16, last menstrual period 11/14/2015, SpO2 100 %. Physical Exam  Constitutional: She appears well-developed.  HENT:  Head: Normocephalic.  Eyes: Pupils are equal, round, and reactive to light.  Neck: Normal range of motion.  Cardiovascular: Normal rate.   Respiratory: Effort normal.  GI: Soft.  Genitourinary:  Genitourinary Comments: very mild left CVAT  Musculoskeletal: Normal range of motion.  Neurological: She is alert.  Skin: Skin is warm.  Psychiatric: She has a normal mood and affect. Her behavior is normal. Judgment and thought content normal.     Assessment/Plan   Very large cyst certainly has potential for mass effect / interferance with future elective pregnanies at its size. Discussed natural history (very low chance malignancy, but can have size progression) and management options (surveillance, aspiration, excision) in detail.   She adamntly wants to proceed with removal. Robotic approach, risks, benefits, expected peri-op course rediscussed in detail.  Sebastian AcheMANNY, Koleson Reifsteck, MD 12/12/2015, 6:46 AM

## 2015-12-12 NOTE — Anesthesia Procedure Notes (Signed)
Procedure Name: Intubation Date/Time: 12/12/2015 8:38 AM Performed by: Thornell MuleSTUBBLEFIELD, Maison Kestenbaum G Pre-anesthesia Checklist: Patient identified, Emergency Drugs available, Suction available and Patient being monitored Patient Re-evaluated:Patient Re-evaluated prior to inductionOxygen Delivery Method: Circle system utilized Preoxygenation: Pre-oxygenation with 100% oxygen Intubation Type: IV induction Ventilation: Mask ventilation without difficulty Laryngoscope Size: Miller and 3 Grade View: Grade I Tube type: Oral Tube size: 7.0 mm Number of attempts: 1 Airway Equipment and Method: Stylet and Oral airway Placement Confirmation: ETT inserted through vocal cords under direct vision,  positive ETCO2 and breath sounds checked- equal and bilateral Secured at: 20 cm Tube secured with: Tape Dental Injury: Teeth and Oropharynx as per pre-operative assessment

## 2015-12-12 NOTE — Op Note (Signed)
NAMMarland Kitchen:  Janine OresJONES, Carloyn                 ACCOUNT NO.:  [id number]  MEDICAL RECORD NO.:  [id number]  LOCATION:  [facility]                         [facility]  PHYSICIAN:  Sebastian Acheheodore Khianna Blazina, MD     DATE OF BIRTH:  15-Jan-1986  DATE OF PROCEDURE: 12/12/2015                               OPERATIVE REPORT   DIAGNOSES: 1. Large left renal cyst with mass effect. 2. Robotic-assisted laparoscopic left renal cyst decortication. 3. Left nephropexy.  ESTIMATED BLOOD LOSS:  50 mL.  COMPLICATION:  None.  SPECIMEN:  Left renal cyst wall for permanent pathology.  FINDINGS: 1. Very large relatively simple-appearing left upper pole cyst with     significant mass effect on the kidney, spleen and left diaphragm. 2. Excessive left renal mobility following excision of the cyst     necessitating nephropexy to place in an anatomic position.  ASSISTANT:  Harrie ForemanAmanda Dancy, PA.  DRAINS: 1. Jackson-Pratt drain to bulb suction. 2. Foley catheter to straight drain.  INDICATION:  [name] is a pleasant 30 year old lady who was noted previously to have a left renal cyst several years ago.  Surveillance ultrasound of this by primary care physician revealed significant enlargement at approximately 10 cm.  She underwent dedicated axial imaging, which revealed left upper pole renal cyst without complex features concerning for carcinoma; however, it did have significant mass effect moderate as spleen and hemidiaphragm.  She also desired future elective pregnancies.  Options were discussed including surveillance versus percutaneous aspiration, sclerotherapy versus more definitive management with formal decortication.  She wished to proceed with the latter.  Informed consent was obtained and placed in the medical record.  PROCEDURE IN DETAIL:  The patient being [name], was verified. Procedure being left robotic renal cyst decortication was confirmed. Procedure was carried out.  Time-out was performed.   Intravenous antibiotics were administered.  General endotracheal anesthesia was introduced.  The patient was placed into a left side up full flank position, applying 15 degrees of stable flexion, superior arm elevator, axillary roll, sequential compression devices, bean bag, bottom leg was bent, top leg straight.  She was further fashioned to the operating table using 3-inch tape over padding across her supraxiphoid chest and her pelvis.  Sterile field was created by prepping and draping the patient's entire left flank and abdomen using chlorhexidine gluconate. A high-flow, low-pressure pneumoperitoneum was obtained using Veress technique in the left lower quadrant having passed the aspiration and drop test.  Next, an 8-mm robotic camera port was placed and positioned approximately 1 handbreadth superior lateral to the umbilicus. Laparoscopic examination of the peritoneal cavity revealed no significant adhesions and no visceral injury.  Additional ports were then placed as follows:  Left subcostal 8-mm robotic port, left far lateral 8-mm robotic port approximately 4 fingerbreadths superior medial to the anterior superior iliac spine, left paramedian inferior robotic port approximately 1 handbreadth superior to the pubic ramus and one 12- mm AirSeal assistant port in the midline approximately 3 fingerbreadths above the camera port.  Robot was docked and passed through the electronic checks.  Initial attention was directed at development of retroperitoneum.  Incision was made lateral to the descending colon from the area of  the splenic flexure towards the area of the internal ring and was carefully swept medially.  There was a very large mass effect from the renal cyst as anticipated with the colonic mesentery being draped over this, very careful dissection was performed developing the plane between the descending colonic mesentery and anterior chest wall and entire kidney.  The pancreas  and splenic vessels were encountered and also very carefully dissected away from the anterior surface of the cyst wall.  Lateral splenic attachment was taken down allowing it to rotate medially on self retraction.  This allowed excellent visualization of the superior, anterior and medial aspects of the cyst. Next, lateral attachments were taken down to allow visualization of the posterior-inferior aspect of the cyst, did appear simple and minimal to decortication technique.  As such, the interface between the cyst and the normal upper pole parenchyma was carefully dissected circumferentially and cyst incision was made and drained 350 mL of straw- colored simple bloody fluid.  The cyst wall was then very carefully and completely excised except for the small portion overlying the kidney parenchyma.  Specimen was placed in an EndoCatch bag for later retrieval.  The base of the cyst wall was then carefully fulgurated, completely ablating the visible mucosal surface.  As such, orientation at the cyst walls would not touch each other.  Following these maneuvers, hemostasis appeared excellent.  All sponge and needle counts were correct.  The kidney was very carefully inspected and found to be with excessive mobility specifically rotating along its polar axis and inferiorly felt that nephropexy was clearly be warranted to maintain proper ureteral alignment.  As such, two figure-of-eight Prolene sutures were placed to the lateral flank fascia into the perirenal fascia, one at the superior-lateral border and another one at the superior border of the kidney.  This resulted in excellent replacement of the kidney to its normal anatomic position and did away with any telescoping of the ureter.  Robot was then undocked.  Specimen was retrieved by removing the in-situ specimen bag through the 12-mm assistant port, which was then closed using Carter-Thomason suture passer at the level of the fascia under  laparoscopic vision.  All incision sites were infiltrated with dilute lyophilized Marcaine and closed at the level of the skin using subcuticular Monocryl followed by Dermabond.  Notably, a closed suction drain was also brought through the left lateral robotic port site to the area of the peritoneal cavity and dressing was applied. Procedure was terminated.  The patient tolerated the procedure well. There were no immediate periprocedural complications.  The patient was taken to the postanesthesia care unit in stable condition.          ______________________________ Sebastian Acheheodore Tate Jerkins, MD     TM/MEDQ  D:  12/12/2015  T:  12/12/2015  Job:  [id number]

## 2015-12-12 NOTE — Progress Notes (Signed)
Day of Surgery   Post Op Check  Subjective: The patient is doing well.  No nausea or vomiting. Pain is adequately controlled. Good UOP with minimal drain output. Labs stable.  Objective: Vital signs in last 24 hours: Temp:  [98.4 F (36.9 C)-98.9 F (37.2 C)] 98.9 F (37.2 C) (08/23 1230) Pulse Rate:  [90-107] 105 (08/23 1230) Resp:  [13-20] 13 (08/23 1213) BP: (113-130)/(80-95) 120/80 (08/23 1230) SpO2:  [94 %-100 %] 98 % (08/23 1413) Weight:  [60.8 kg (134 lb)] 60.8 kg (134 lb) (08/23 0700)  Intake/Output from previous day: No intake/output data recorded. Intake/Output this shift: Total I/O In: 2200 [I.V.:1900; Other:300] Out: 1115 [Urine:710; Drains:5; Other:350; Blood:50]  Physical Exam:  General: Alert and oriented. GI: Soft, Nondistended. Incisions: Clean and dry. JP with scant fluid. Urine: Clear  Lab Results:  Recent Labs  12/12/15 1106  HGB 11.0*  HCT 33.3*         No results for input(s): CREATININE in the last 168 hours.         Results for orders placed or performed during the hospital encounter of 12/12/15 (from the past 24 hour(s))  Hemoglobin and hematocrit, blood     Status: Abnormal   Collection Time: 12/12/15 11:06 AM  Result Value Ref Range   Hemoglobin 11.0 (L) 12.0 - 15.0 g/dL   HCT 91.433.3 (L) 78.236.0 - 95.646.0 %    Assessment/Plan: POD# 0 s/p robotic left renal cyst decortication and nephropexy.  1) Ambulate, Incentive spirometry 2) Pain control 3) Maintenance IV fluids 4) d/c foley and medlock in AM 5) clear liquid diet   LOS: 0 days   Carolin Coyroy A Sukhu 12/12/2015, 3:59 PM     I have seen and examined the patient and agree with above. Doing well POD 0.

## 2015-12-12 NOTE — Discharge Instructions (Signed)

## 2015-12-13 LAB — HEMOGLOBIN AND HEMATOCRIT, BLOOD
HCT: 31.5 % — ABNORMAL LOW (ref 36.0–46.0)
HEMOGLOBIN: 10.6 g/dL — AB (ref 12.0–15.0)

## 2015-12-13 LAB — BASIC METABOLIC PANEL
ANION GAP: 5 (ref 5–15)
BUN: <5 mg/dL — ABNORMAL LOW (ref 6–20)
CO2: 25 mmol/L (ref 22–32)
Calcium: 8.3 mg/dL — ABNORMAL LOW (ref 8.9–10.3)
Chloride: 107 mmol/L (ref 101–111)
Creatinine, Ser: 0.5 mg/dL (ref 0.44–1.00)
Glucose, Bld: 94 mg/dL (ref 65–99)
POTASSIUM: 3.6 mmol/L (ref 3.5–5.1)
SODIUM: 137 mmol/L (ref 135–145)

## 2015-12-13 MED ORDER — SODIUM CHLORIDE 0.9 % IV SOLN
250.0000 mL | INTRAVENOUS | Status: DC | PRN
Start: 2015-12-13 — End: 2015-12-13

## 2015-12-13 MED ORDER — SODIUM CHLORIDE 0.9% FLUSH
3.0000 mL | Freq: Two times a day (BID) | INTRAVENOUS | Status: DC
Start: 2015-12-13 — End: 2015-12-13
  Administered 2015-12-13: 3 mL via INTRAVENOUS

## 2015-12-13 MED ORDER — SENNOSIDES-DOCUSATE SODIUM 8.6-50 MG PO TABS: 1.0000 | ORAL_TABLET | Freq: Two times a day (BID) | ORAL | 0 refills | Status: DC

## 2015-12-13 MED ORDER — SODIUM CHLORIDE 0.9% FLUSH
3.0000 mL | INTRAVENOUS | Status: DC | PRN
Start: 2015-12-13 — End: 2015-12-13

## 2015-12-13 NOTE — Discharge Summary (Signed)
Physician Discharge Summary  Patient ID: Claudia MilianKelly N Rudnicki MRN: [id number] DOB/AGE: 30/09/1985 30 y.o.  Admit date: 12/12/2015 Discharge date: 12/13/2015  Admission Diagnoses: large left renal cyst  Discharge Diagnoses:  Active Problems:   Renal cyst, left   Discharged Condition: good  Hospital Course:   1- Large Left Renal Cyst - pt underwent robotic left renal cyst decortication with nephropexy on 12/12/15, the day of admission, without acute complication. She was admitted to 4th floor Urology service post-op where she began her vigorous recovery. By the afternoon of POD 1 she is ambulatory, pain controlled on PO meds, JP removed as output scant, voiding well after foley removal, and felt to be adequate for discharge. Hgb >10, Cr <1.5 at discharge.   Consults: None  Significant Diagnostic Studies: labs: as per above, surgical pathology pending  Treatments: surgery:  robotic left renal cyst decortication with nephropexy on 12/12/15  Discharge Exam: Blood pressure (!) 122/91, pulse 86, temperature 98.8 F (37.1 C), temperature source Oral, resp. rate 20, height 5\' 2"  (1.575 m), weight 60.8 kg (134 lb), last menstrual period 11/14/2015, SpO2 95 %. General appearance: alert, cooperative and appears stated age Eyes: negative Nose: Nares normal. Septum midline. Mucosa normal. No drainage or sinus tenderness. Throat: lips, mucosa, and tongue normal; teeth and gums normal Neck: supple, symmetrical, trachea midline Back: symmetric, no curvature. ROM normal. No CVA tenderness. Resp: non-labored on room air Cardio: Nl rate GI: soft, non-tender; bowel sounds normal; no masses,  no organomegaly Extremities: extremities normal, atraumatic, no cyanosis or edema Pulses: 2+ and symmetric Skin: Skin color, texture, turgor normal. No rashes or lesions Lymph nodes: Cervical, supraclavicular, and axillary nodes normal. Neurologic: Grossly normal Incision/Wound: recent port sites and prior JP site  c/d/i. No hernias.   Disposition: [facility]-Another Health Care Institution Not Defined     Medication List    STOP taking these medications   traMADol 50 MG tablet Commonly known as:  ULTRAM     TAKE these medications   HYDROcodone-acetaminophen 5-325 MG tablet Commonly known as:  NORCO Take 1-2 tablets by mouth every 4 (four) hours as needed for moderate pain.   norgestimate-ethinyl estradiol 0.25-35 MG-MCG tablet Commonly known as:  ORTHO-CYCLEN,SPRINTEC,PREVIFEM Take 1 tablet by mouth daily.   senna-docusate 8.6-50 MG tablet Commonly known as:  Senokot-S Take 1 tablet by mouth 2 (two) times daily. While taking pain meds to prevent constipation      Follow-up Information    Sebastian AcheMANNY, Bryttani Blew, MD On 12/31/2015.   Specialty:  Urology Why:  at 8 AM for MD visit. Dr. Berneice HeinrichManny will call you with pathology results when available.  Contact information: [address] [place] [address] [phone]           Signed: Sebastian AcheMANNY, Airik Goodlin 12/13/2015, 12:59 PM

## 2015-12-20 ENCOUNTER — Encounter: Payer: Self-pay | Admitting: Family

## 2015-12-20 ENCOUNTER — Ambulatory Visit (INDEPENDENT_AMBULATORY_CARE_PROVIDER_SITE_OTHER): Payer: BLUE CROSS/BLUE SHIELD | Admitting: Family

## 2015-12-20 VITALS — BP 110/84 | HR 105 | Temp 98.0°F | Resp 16 | Ht 62.0 in | Wt 132.4 lb

## 2015-12-20 DIAGNOSIS — N2889 Other specified disorders of kidney and ureter: Secondary | ICD-10-CM | POA: Diagnosis not present

## 2015-12-20 MED ORDER — HYDROCODONE-ACETAMINOPHEN 5-325 MG PO TABS: 1.0000 | ORAL_TABLET | ORAL | 0 refills | Status: DC | PRN

## 2015-12-20 NOTE — Patient Instructions (Signed)
Thank you for choosing [facility].  SUMMARY AND INSTRUCTIONS:  Medication:  Please continue to use Tylenol and Ibuprofen.   Use the hydrocodone for the breakthrough pain as needed.  Your prescription(s) have been submitted to your pharmacy or been printed and provided for you. Please take as directed and contact our office if you believe you are having problem(s) with the medication(s) or have any questions.  Follow up:  Follow up with Dr. Berneice HeinrichManny.   We will schedule your CT  If your symptoms worsen or fail to improve, please contact our office for further instruction, or in case of emergency go directly to the emergency room at the closest medical facility.

## 2015-12-20 NOTE — Assessment & Plan Note (Signed)
Status post renal mass excision with continued pain that is reasonably controlled with the current regimen. Incisions are healing well with no evidence of infection. Refill hydrocodone-acetaminophen. Encouraged to use non-pharmacological therapies as well to help with discomfort.Follow up with urology as scheduled or seek further care if symptoms worsen.

## 2015-12-20 NOTE — Progress Notes (Signed)
Subjective:    Patient ID: [name], female    DOB: 02-04-1986, 30 y.o.   MRN: [id number]  Chief Complaint  Patient presents with  . Follow-up    she had surgery last week and is in pain has a follow up with surgeon but it is on sept 11th and she states she can not wait that long    HPI:  [name] is a 30 y.o. female who  has a past medical history of Bartholin cyst; Cervical dysplasia; and Depression. and presents today for a follow up office visit.  Recently underwent a robotic left renal cyst decotication with left nephropexy. Has been experiencing left sided back pain as well as incisional pain that is managed with hydocodone-acetaminophen which does help with the pain. Reports taking the medication as prescribed and denies adverse side effects or constipation. Trying to work and decrease narcotic usage and use Tylenol, however she continues to need the increased pain relief at this time. Pain is described as sharp on occasion. No signs of infections on her scars or fevers. Currently taking the medication about 2 times per day as needed. Has follow-up with urology on 12/31/15.  No Known Allergies    Outpatient Medications Prior to Visit  Medication Sig Dispense Refill  . HYDROcodone-acetaminophen (NORCO) 5-325 MG tablet Take 1-2 tablets by mouth every 4 (four) hours as needed for moderate pain. 30 tablet 0  . norgestimate-ethinyl estradiol (ORTHO-CYCLEN,SPRINTEC,PREVIFEM) 0.25-35 MG-MCG tablet Take 1 tablet by mouth daily.    Marland Kitchen senna-docusate (SENOKOT-S) 8.6-50 MG tablet Take 1 tablet by mouth 2 (two) times daily. While taking pain meds to prevent constipation 30 tablet 0   No facility-administered medications prior to visit.     Review of Systems  Constitutional: Negative for chills and fever.  Genitourinary: Positive for flank pain. Negative for decreased urine volume, difficulty urinating, dysuria, frequency, hematuria, pelvic pain and urgency.  Skin: Positive for  wound.      Objective:    BP 110/84 (BP Location: Left Arm, Patient Position: Sitting, Cuff Size: Normal)   Pulse (!) 105   Temp 98 F (36.7 C) (Oral)   Resp 16   Ht 5\' 2"  (1.575 m)   Wt 132 lb 6.4 oz (60.1 kg)   LMP 11/14/2015   SpO2 96%   BMI 24.22 kg/m  Nursing note and vital signs reviewed.  Physical Exam  Constitutional: She is oriented to person, place, and time. She appears well-developed and well-nourished. No distress.  Appears uncomfortable.  Cardiovascular: Normal rate, regular rhythm, normal heart sounds and intact distal pulses.   Pulmonary/Chest: Effort normal and breath sounds normal.  Abdominal: There is no CVA tenderness.  Incisional sites appear clean, dry and intact with good approximation and no evidence of infection.  Neurological: She is alert and oriented to person, place, and time.  Skin: Skin is warm and dry.  Psychiatric: She has a normal mood and affect. Her behavior is normal. Judgment and thought content normal.       Assessment & Plan:   Problem List Items Addressed This Visit      Other   Renal mass - Primary    Status post renal mass excision with continued pain that is reasonably controlled with the current regimen. Incisions are healing well with no evidence of infection. Refill hydrocodone-acetaminophen. Encouraged to use non-pharmacological therapies as well to help with discomfort.Follow up with urology as scheduled or seek further care if symptoms worsen.  Relevant Medications   HYDROcodone-acetaminophen (NORCO) 5-325 MG tablet    Other Visit Diagnoses   None.      I have discontinued Ms. Barnett ApplebaumJones's norgestimate-ethinyl estradiol and senna-docusate. I am also having her maintain her HYDROcodone-acetaminophen.   Meds ordered this encounter  Medications  . HYDROcodone-acetaminophen (NORCO) 5-325 MG tablet    Sig: Take 1-2 tablets by mouth every 4 (four) hours as needed for moderate pain.    Dispense:  30 tablet    Refill:   0    Order Specific Question:   Supervising Provider    Answer:   Hillard DankerRAWFORD, ELIZABETH A [[id number]]     Follow-up: Return if symptoms worsen or fail to improve.  Jeanine Luzalone, Pamalee Marcoe, FNP

## 2015-12-23 DIAGNOSIS — S93429A Sprain of deltoid ligament of unspecified ankle, initial encounter: Secondary | ICD-10-CM | POA: Diagnosis not present

## 2015-12-23 DIAGNOSIS — M25572 Pain in left ankle and joints of left foot: Secondary | ICD-10-CM | POA: Diagnosis not present

## 2015-12-31 DIAGNOSIS — R911 Solitary pulmonary nodule: Secondary | ICD-10-CM | POA: Diagnosis not present

## 2015-12-31 DIAGNOSIS — N281 Cyst of kidney, acquired: Secondary | ICD-10-CM | POA: Diagnosis not present

## 2016-05-14 ENCOUNTER — Other Ambulatory Visit: Payer: Self-pay | Admitting: Obstetrics & Gynecology

## 2016-05-15 LAB — CYTOLOGY - PAP

## 2016-07-02 ENCOUNTER — Ambulatory Visit (INDEPENDENT_AMBULATORY_CARE_PROVIDER_SITE_OTHER): Payer: 59 | Admitting: Pulmonary Disease

## 2016-07-02 ENCOUNTER — Encounter: Payer: Self-pay | Admitting: Pulmonary Disease

## 2016-07-02 VITALS — BP 114/74 | HR 104 | Ht 62.0 in | Wt 136.0 lb

## 2016-07-02 DIAGNOSIS — R911 Solitary pulmonary nodule: Secondary | ICD-10-CM

## 2016-07-02 NOTE — Patient Instructions (Signed)
Will schedule follow up CT scan of chest for March 2019 and schedule follow up after this

## 2016-07-02 NOTE — Progress Notes (Signed)
   Subjective:    Patient ID: Oswaldo MilianKelly N Raneri, female    DOB: 05/30/1985, 31 y.o.   MRN: [id number]  HPI    Review of Systems  Constitutional: Negative for fever and unexpected weight change.  HENT: Negative for congestion, dental problem, ear pain, nosebleeds, postnasal drip, rhinorrhea, sinus pressure, sneezing, sore throat and trouble swallowing.   Eyes: Negative for redness and itching.  Respiratory: Negative for cough, chest tightness, shortness of breath and wheezing.   Cardiovascular: Negative for palpitations and leg swelling.  Gastrointestinal: Negative for nausea and vomiting.  Genitourinary: Negative for dysuria.  Musculoskeletal: Negative for joint swelling.  Skin: Negative for rash.  Neurological: Negative for headaches.  Hematological: Does not bruise/bleed easily.  Psychiatric/Behavioral: Negative for dysphoric mood. The patient is not nervous/anxious.        Objective:   Physical Exam        Assessment & Plan:

## 2016-07-02 NOTE — Progress Notes (Signed)
Past surgical history She  has a past surgical history that includes Cholecystectomy (2011); Cervical biopsy w/ loop electrode excision (2012); ORIF clavicular fracture (06/10/2011); Cesarean section; and Laparoscopic removal renal cyst (Left, 12/12/2015).  Family history Her family history includes Cancer in her paternal grandmother; Diabetes in her father; Healthy in her mother; Prostate cancer in her paternal grandfather.  Social history She  reports that she quit smoking about 7 years ago. She has a 0.75 pack-year smoking history. She has never used smokeless tobacco. She reports that she drinks alcohol. She reports that she does not use drugs.  No Known Allergies   Review of Systems  Constitutional: Negative for fever and unexpected weight change.  HENT: Negative for congestion, dental problem, ear pain, nosebleeds, postnasal drip, rhinorrhea, sinus pressure, sneezing, sore throat and trouble swallowing.   Eyes: Negative for redness and itching.  Respiratory: Negative for cough, chest tightness, shortness of breath and wheezing.   Cardiovascular: Negative for palpitations and leg swelling.  Gastrointestinal: Negative for nausea and vomiting.  Genitourinary: Negative for dysuria.  Musculoskeletal: Negative for joint swelling.  Skin: Negative for rash.  Neurological: Negative for headaches.  Hematological: Does not bruise/bleed easily.  Psychiatric/Behavioral: Negative for dysphoric mood. The patient is not nervous/anxious.     No current outpatient prescriptions on file prior to visit.   No current facility-administered medications on file prior to visit.     Chief Complaint  Patient presents with  . PULMONARY CONSULT    Referred by Dr Berneice Heinrich for lung nodule. Recent CT full body showed lung nodule --[facility].     Pulmonary tests CT chest 06/20/16 >> 2 mm RML nodule, 10 mm RLL nodule  Past medical history She  has a past medical history of Bartholin cyst; Cervical  dysplasia; and Depression.  Vital signs BP 114/74 (BP Location: Right Arm, Cuff Size: Normal)   Pulse (!) 104   Ht 5\' 2"  (1.575 m)   Wt 136 lb (61.7 kg)   SpO2 98%   BMI 24.87 kg/m   History of present illness [name] is a 31 y.o. female former smoker with lung nodule.  She was seen by urology for renal cyst >> benign lesion.  She used to smoke 1 pack per week and quit in 2011.  She had CT abd/pelvis which showed nodule in Rt lower lung.  She had follow up CT chest March 2018 which showed nodules in RML and RLL.  She denies cough, wheeze, sputum chest pain, fever, weight loss, sweats, hemoptysis.  No history of pneumonia, asthma, or exposure to tuberculosis.  She is from [place].  Never in the [employer].  No exposure to animals, birds.  No recent travel or sick exposures.  She works as a Designer, fashion/clothing.      Physical exam  General - No distress ENT - No sinus tenderness, no oral exudate, no LAN, no thyromegaly, TM clear, pupils equal/reactive Cardiac - s1s2 regular, no murmur, pulses symmetric Chest - No wheeze/rales/dullness, good air entry, normal respiratory excursion Back - No focal tenderness Abd - Soft, non-tender, no organomegaly, + bowel sounds Ext - No edema Neuro - Normal strength, cranial nerves intact Skin - No rashes Psych - Normal mood, and behavior   CMP Latest Ref Rng & Units 12/13/2015 12/12/2015 03/30/2014  Glucose 65 - 99 mg/dL 94 161(W) 98  BUN 6 - 20 mg/dL <9(U) 5(L) 8  Creatinine 0.44 - 1.00 mg/dL 0.45 4.09 8.11  Sodium 135 - 145  mmol/L 137 138 138  Potassium 3.5 - 5.1 mmol/L 3.6 3.8 3.4(L)  Chloride 101 - 111 mmol/L 107 109 105  CO2 22 - 32 mmol/L 25 24 21   Calcium 8.9 - 10.3 mg/dL 8.3(L) 7.8(L) 8.0(L)  Total Protein 6.0 - 8.3 g/dL - - 6.0  Total Bilirubin 0.3 - 1.2 mg/dL - - 1.3(H)  Alkaline Phos 39 - 117 U/L - - 77  AST 0 - 37 U/L - - 18  ALT 0 - 35 U/L - - 16     CBC Latest Ref Rng & Units 12/13/2015 12/12/2015  12/05/2015  WBC 4.0 - 10.5 K/uL - - 8.1  Hemoglobin 12.0 - 15.0 g/dL 10.6(L) 11.0(L) 11.9(L)  Hematocrit 36.0 - 46.0 % 31.5(L) 33.3(L) 35.8(L)  Platelets 150 - 400 K/uL - - 279      Discussion She has incidental finding of Rt lung nodules.  She has remote history of smoking.  She does not have any other significant respiratory findings.  These lesions are most likely benign, but can't be absolutely certain without additional monitoring.   Assessment/plan  Lung nodules. - will arrange for follow up CT chest without contrast for March 2019    Patient Instructions  Will schedule follow up CT scan of chest for March 2019 and schedule follow up after this    Coralyn HellingVineet Madesyn Ast, MD [facility] Pager:  [phone] 07/02/2016, 11:58 AM

## 2016-12-23 ENCOUNTER — Encounter (HOSPITAL_COMMUNITY): Payer: Self-pay | Admitting: Emergency Medicine

## 2016-12-23 DIAGNOSIS — N7689 Other specified inflammation of vagina and vulva: Secondary | ICD-10-CM | POA: Diagnosis present

## 2016-12-23 DIAGNOSIS — Z87891 Personal history of nicotine dependence: Secondary | ICD-10-CM | POA: Diagnosis not present

## 2016-12-23 DIAGNOSIS — N760 Acute vaginitis: Secondary | ICD-10-CM | POA: Diagnosis not present

## 2016-12-23 DIAGNOSIS — R110 Nausea: Secondary | ICD-10-CM | POA: Insufficient documentation

## 2016-12-23 LAB — URINALYSIS, ROUTINE W REFLEX MICROSCOPIC
BILIRUBIN URINE: NEGATIVE
Glucose, UA: NEGATIVE mg/dL
HGB URINE DIPSTICK: NEGATIVE
Ketones, ur: 5 mg/dL — AB
Nitrite: NEGATIVE
Protein, ur: 30 mg/dL — AB
SPECIFIC GRAVITY, URINE: 1.021 (ref 1.005–1.030)
pH: 5 (ref 5.0–8.0)

## 2016-12-23 LAB — CBC
HEMATOCRIT: 35.8 % — AB (ref 36.0–46.0)
Hemoglobin: 11.9 g/dL — ABNORMAL LOW (ref 12.0–15.0)
MCH: 30.2 pg (ref 26.0–34.0)
MCHC: 33.2 g/dL (ref 30.0–36.0)
MCV: 90.9 fL (ref 78.0–100.0)
PLATELETS: 307 10*3/uL (ref 150–400)
RBC: 3.94 MIL/uL (ref 3.87–5.11)
RDW: 12.6 % (ref 11.5–15.5)
WBC: 23.1 10*3/uL — AB (ref 4.0–10.5)

## 2016-12-23 LAB — COMPREHENSIVE METABOLIC PANEL
ALT: 13 U/L — ABNORMAL LOW (ref 14–54)
ANION GAP: 12 (ref 5–15)
AST: 25 U/L (ref 15–41)
Albumin: 3.8 g/dL (ref 3.5–5.0)
Alkaline Phosphatase: 63 U/L (ref 38–126)
BUN: 7 mg/dL (ref 6–20)
CHLORIDE: 103 mmol/L (ref 101–111)
CO2: 23 mmol/L (ref 22–32)
CREATININE: 0.85 mg/dL (ref 0.44–1.00)
Calcium: 8.6 mg/dL — ABNORMAL LOW (ref 8.9–10.3)
GFR calc Af Amer: >60 mL/min (ref >60)
Glucose, Bld: 105 mg/dL — ABNORMAL HIGH (ref 65–99)
POTASSIUM: 3.6 mmol/L (ref 3.5–5.1)
Sodium: 138 mmol/L (ref 135–145)
Total Bilirubin: 0.8 mg/dL (ref 0.3–1.2)
Total Protein: 7.5 g/dL (ref 6.5–8.1)

## 2016-12-23 LAB — LIPASE, BLOOD: LIPASE: 27 U/L (ref 11–51)

## 2016-12-23 NOTE — ED Triage Notes (Signed)
Pt states she has a cyst in her vaginal area x 2-3 days, hx of same. Concerned that it is infected, reports n/v began today.

## 2016-12-24 ENCOUNTER — Emergency Department (HOSPITAL_COMMUNITY)
Admission: EM | Admit: 2016-12-24 | Discharge: 2016-12-24 | Disposition: A | Discharge status: Home / Self Care | Payer: 59 | Attending: Emergency Medicine | Admitting: Emergency Medicine

## 2016-12-24 DIAGNOSIS — N760 Acute vaginitis: Secondary | ICD-10-CM

## 2016-12-24 MED ORDER — HYDROCODONE-ACETAMINOPHEN 5-325 MG PO TABS: 1.0000 | ORAL_TABLET | Freq: Four times a day (QID) | ORAL | 0 refills | Status: DC | PRN

## 2016-12-24 MED ORDER — ONDANSETRON HCL 4 MG/2ML IJ SOLN
4.0000 mg | Freq: Once | INTRAMUSCULAR | Status: AC
  Administered 2016-12-24: 4 mg via INTRAVENOUS
  Filled 2016-12-24: qty 2

## 2016-12-24 MED ORDER — HYDROMORPHONE HCL 1 MG/ML IJ SOLN
1.0000 mg | Freq: Once | INTRAMUSCULAR | Status: AC
  Administered 2016-12-24: 1 mg via INTRAVENOUS
  Filled 2016-12-24: qty 1

## 2016-12-24 MED ORDER — CLINDAMYCIN HCL 150 MG PO CAPS
450.0000 mg | ORAL_CAPSULE | Freq: Three times a day (TID) | ORAL | 0 refills | Status: DC
Start: 2016-12-24 — End: 2017-02-06

## 2016-12-24 MED ORDER — ONDANSETRON HCL 4 MG PO TABS: 4.0000 mg | ORAL_TABLET | Freq: Three times a day (TID) | ORAL | 0 refills | Status: DC | PRN

## 2016-12-24 MED ORDER — SODIUM CHLORIDE 0.9 % IV BOLUS (SEPSIS)
1000.0000 mL | Freq: Once | INTRAVENOUS | Status: AC
  Administered 2016-12-24: 1000 mL via INTRAVENOUS

## 2016-12-24 NOTE — Discharge Instructions (Signed)
Return here for any worsening in your condition.  Use warm compresses and warm soaks.  Follow-up with your GYN doctor as scheduled.  If her condition changes or worsens between now and that point return to the emergency department for further evaluation

## 2016-12-24 NOTE — ED Provider Notes (Signed)
[facility]-EMER[facility]Y DEPT Provider Note   CSN: [id number] Arrival date & time: 12/23/16  2024     History   Chief Complaint Chief Complaint  Patient presents with  . Cyst  . Emesis    HPI [name] is a 31 y.o. female.  HPI Patient presents to the emergency department with pain to the right vaginal and labial area.  The patient states that this started to bother her 2 days ago.  She states that she started having nausea earlier today.  The patient states that he thinks he initially better with certain positions, sitting and palpation make the pain worse.  The patient states she has had similar issues on the left side of the past. The patient denies chest pain, shortness of breath, headache,blurred vision, neck pain, fever cough, weakness, numbness, dizziness, anorexia, edema, abdominal pain, vomiting, diarrhea, rash, back pain, dysuria, hematemesis, bloody stool, near syncope, or syncope. Past Medical History:  Diagnosis Date  . Bartholin cyst    LEFT  . Cervical dysplasia   . Depression     Patient Active Problem List   Diagnosis Date Noted  . Renal cyst, left 12/12/2015  . Renal mass 12/06/2015  . Lung nodule 12/06/2015  . Benzodiazepine overdose 03/30/2014  . Depression 03/30/2014  . Head injury   . Suicide attempt (HCC)   . Drug overdose, intentional (HCC) 03/29/2014  . Contraception management 01/18/2014  . Hx of abnormal cervical Pap smear 01/18/2014    Past Surgical History:  Procedure Laterality Date  . CERVICAL BIOPSY  W/ LOOP ELECTRODE EXCISION  2012  . CESAREAN SECTION     '05; '07  . CHOLECYSTECTOMY  2011  . LAPAROSCOPIC REMOVAL RENAL CYST Left 12/12/2015   Procedure: XI ROBOTIC LEFT RENAL CYST DECORTICATION WITH LEFT NEPHROPEXY;  Surgeon: Sebastian Ache, MD;  Location: [facility];  Service: Urology;  Laterality: Left;  . ORIF CLAVICULAR FRACTURE  06/10/2011   Procedure: OPEN REDUCTION INTERNAL FIXATION (ORIF) CLAVICULAR FRACTURE;  Surgeon: Cammy Copa,  MD;  Location: [facility] OR;  Service: Orthopedics;  Laterality: Right;  ORIF Right clavicle    OB History    No data available       Home Medications    Prior to Admission medications   Medication Sig Start Date End Date Taking? Authorizing Provider  clindamycin (CLEOCIN) 150 MG capsule Take 3 capsules (450 mg total) by mouth 3 (three) times daily. 12/24/16   Jahmire Ruffins, Cristal Deer, PA-C  HYDROcodone-acetaminophen (NORCO/VICODIN) 5-325 MG tablet Take 1 tablet by mouth every 6 (six) hours as needed for moderate pain. 12/24/16   Masaichi Kracht, Cristal Deer, PA-C  ondansetron (ZOFRAN) 4 MG tablet Take 1 tablet (4 mg total) by mouth every 8 (eight) hours as needed for nausea or vomiting. 12/24/16   Charlestine Night, PA-C    Family History Family History  Problem Relation Age of Onset  . Healthy Mother   . Diabetes Father   . Cancer Paternal Grandmother        CERVICAL  . Prostate cancer Paternal Grandfather     Social History Social History  Substance Use Topics  . Smoking status: Former Smoker    Packs/day: 0.25    Years: 3.00    Quit date: 04/21/2009  . Smokeless tobacco: Never Used     Comment: smoking was mostly social  . Alcohol use Yes     Comment: 1-3 glasses of wine a week.     Allergies   Patient has no known allergies.   Review of Systems  Review of Systems All other systems negative except as documented in the HPI. All pertinent positives and negatives as reviewed in the HPI.   Physical Exam Updated Vital Signs BP 118/83   Pulse 100   Temp 98 F (36.7 C)   Resp 16   Ht 5\' 2"  (1.575 m)   Wt 59 kg (130 lb)   LMP 12/02/2016   SpO2 100%   BMI 23.78 kg/m   Physical Exam  Constitutional: She is oriented to person, place, and time. She appears well-developed and well-nourished. No distress.  HENT:  Head: Normocephalic and atraumatic.  Eyes: Pupils are equal, round, and reactive to light.  Pulmonary/Chest: Effort normal.  Abdominal: Soft. Bowel sounds are normal. There  is no tenderness. There is no guarding.  Genitourinary:     Neurological: She is alert and oriented to person, place, and time.  Skin: Skin is warm and dry.  Psychiatric: She has a normal mood and affect.  Nursing note and vitals reviewed.    ED Treatments / Results  Labs (all labs ordered are listed, but only abnormal results are displayed) Labs Reviewed  COMPREHENSIVE METABOLIC PANEL - Abnormal; Notable for the following:       Result Value   Glucose, Bld 105 (*)    Calcium 8.6 (*)    ALT 13 (*)    All other components within normal limits  CBC - Abnormal; Notable for the following:    WBC 23.1 (*)    Hemoglobin 11.9 (*)    HCT 35.8 (*)    All other components within normal limits  URINALYSIS, ROUTINE W REFLEX MICROSCOPIC - Abnormal; Notable for the following:    APPearance HAZY (*)    Ketones, ur 5 (*)    Protein, ur 30 (*)    Leukocytes, UA LARGE (*)    Bacteria, UA RARE (*)    Squamous Epithelial / LPF 6-30 (*)    All other components within normal limits  LIPASE, BLOOD    EKG  EKG Interpretation None       Radiology No results found.  Procedures Procedures (including critical care time)  Medications Ordered in ED Medications  sodium chloride 0.9 % bolus 1,000 mL (0 mLs Intravenous Stopped 12/24/16 0534)  ondansetron (ZOFRAN) injection 4 mg (4 mg Intravenous Given 12/24/16 0423)  HYDROmorphone (DILAUDID) injection 1 mg (1 mg Intravenous Given 12/24/16 0423)     Initial Impression / Assessment and Plan / ED Course  I have reviewed the triage vital signs and the nursing notes.  Pertinent labs & imaging results that were available during my care of the patient were reviewed by me and considered in my medical decision making (see chart for details).     I had a long discussion with the patient and her husband about the course of treatment.  I did state that at this point, this area is not very swollen and fluctuant and weak attempt to use warm soaks, along  with antibiotics to see if we can alleviate this issue or we could open the area to see if we could express any pus.  The patient would like to hold off on incision and drainage.  She does have an appointment with her GYN doctor tomorrow.  I did advise her that if sugar condition changes, to return to the emergency department.  Patient agrees the plan and all questions were answered  Final Clinical Impressions(s) / ED Diagnoses   Final diagnoses:  Labial infection    New  Prescriptions Discharge Medication List as of 12/24/2016  5:23 AM    START taking these medications   Details  clindamycin (CLEOCIN) 150 MG capsule Take 3 capsules (450 mg total) by mouth 3 (three) times daily., Starting Wed 12/24/2016, Print    HYDROcodone-acetaminophen (NORCO/VICODIN) 5-325 MG tablet Take 1 tablet by mouth every 6 (six) hours as needed for moderate pain., Starting Wed 12/24/2016, Print    ondansetron (ZOFRAN) 4 MG tablet Take 1 tablet (4 mg total) by mouth every 8 (eight) hours as needed for nausea or vomiting., Starting Wed 12/24/2016, Print         Kymoni Lesperance, [place], PA-C 12/25/16 [id number]    Gerhard MunchLockwood, Robert, MD 12/28/16 [phone]

## 2016-12-24 NOTE — ED Notes (Signed)
Witnessed external genital exam performed by C.Lawyer PA-C.

## 2017-01-30 DIAGNOSIS — R350 Frequency of micturition: Secondary | ICD-10-CM | POA: Diagnosis not present

## 2017-01-30 DIAGNOSIS — N390 Urinary tract infection, site not specified: Secondary | ICD-10-CM | POA: Diagnosis not present

## 2017-01-30 DIAGNOSIS — R110 Nausea: Secondary | ICD-10-CM | POA: Diagnosis not present

## 2017-01-30 DIAGNOSIS — M545 Low back pain: Secondary | ICD-10-CM | POA: Diagnosis not present

## 2017-02-06 ENCOUNTER — Ambulatory Visit (INDEPENDENT_AMBULATORY_CARE_PROVIDER_SITE_OTHER): Payer: BLUE CROSS/BLUE SHIELD | Admitting: Family Medicine

## 2017-02-06 ENCOUNTER — Encounter: Payer: Self-pay | Admitting: Family Medicine

## 2017-02-06 VITALS — BP 122/80 | HR 92 | Temp 98.2°F | Resp 14 | Ht 62.0 in | Wt 141.0 lb

## 2017-02-06 DIAGNOSIS — F988 Other specified behavioral and emotional disorders with onset usually occurring in childhood and adolescence: Secondary | ICD-10-CM | POA: Diagnosis not present

## 2017-02-06 MED ORDER — LISDEXAMFETAMINE DIMESYLATE 30 MG PO CAPS: 30.0000 mg | ORAL_CAPSULE | Freq: Every day | ORAL | 0 refills | Status: DC

## 2017-02-09 NOTE — Progress Notes (Signed)
Subjective:    Patient ID: Claudia MilianKelly N Severe, female    DOB: 04/24/1985, 31 y.o.   MRN: [id number]  HPI Patient has not been seen in the last 2 years. Previously I was seeing the patient for depression and anxiety. Past medical history a suicide at secondary to benzodiazepine overdose.  Patient was managed with Trintellix and symptoms improved.  She has subsequently graduated from Social workerlaw school. 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 She is here today to discuss treatment for. However her symptoms do not sound like anxiety. First she denies any depression. She denies any anhedonia. She denies any suicidal ideation. She denies any sleeping problems or insomnia. She denies any lack of energy. She does complain of poor focus. Patient denies any mania. She denies any delusional behavior. She denies any increased goal directed behavior.She denies any rapid mood swings She denies any significant insomnia lasting days. She denies any increased energy. However she does report having a hard time sustaining attention at work. She states that she can start task quickly and with motivation however she quickly loses focus and become sidetracked easily. This is a serious problem given her profession. She reports poor organization. She states that there are false upon falls stacked on her desk with no apparent organization and is very hard for her to find materials. She reports poor time management which causes a problem finishing her assigned task in an orderly manner. She fails to pay close attention to details. She has a difficult time sustaining attention at work. She can become easily distracted This has been going on for months although is difficult for her to exactly pinpoint when the symptoms began Her boyfriend and others close to her state that she frequently does not listen even when spoken to. However she denies any hyperactivityor impulsivity. Past Medical History:    Diagnosis Date  . Bartholin cyst    LEFT  . Cervical dysplasia   . Depression    Past Surgical History:  Procedure Laterality Date  . CERVICAL BIOPSY  W/ LOOP ELECTRODE EXCISION  2012  . CESAREAN SECTION     '05; '07  . CHOLECYSTECTOMY  2011  . LAPAROSCOPIC REMOVAL RENAL CYST Left 12/12/2015   Procedure: XI ROBOTIC LEFT RENAL CYST DECORTICATION WITH LEFT NEPHROPEXY;  Surgeon: Sebastian Acheheodore Manny, MD;  Location: [facility];  Service: Urology;  Laterality: Left;  . ORIF CLAVICULAR FRACTURE  06/10/2011   Procedure: OPEN REDUCTION INTERNAL FIXATION (ORIF) CLAVICULAR FRACTURE;  Surgeon: Cammy CopaGregory Scott Dean, MD;  [facility];  Service: Orthopedics;  Laterality: Right;  ORIF Right clavicle   No current outpatient prescriptions on file prior to visit.   No current facility-administered medications on file prior to visit.    No Known Allergies Social History   Social History  . Marital status: Single    Spouse name: N/A  . Number of children: 2  . Years of education: 5518   Occupational History  . Lawyer    Social History Main Topics  . Smoking status: Former Smoker    Packs/day: 0.25    Years: 3.00    Quit date: 04/21/2009  . Smokeless tobacco: Never Used     Comment: smoking was mostly social  . Alcohol use Yes     Comment: 1-3 glasses of wine a week.  . Drug use: No  . Sexual activity: Yes    Birth control/ protection: Pill   Other Topics Concern  .  Not on file   Social History Narrative   Fun: Work and hang out with children   Denies abuse and feels safe at home.       Review of Systems  All other systems reviewed and are negative.      Objective:   Physical Exam  Constitutional: She is oriented to person, place, and time. She appears well-developed and well-nourished. No distress.  Cardiovascular: Normal rate, regular rhythm and normal heart sounds.   Pulmonary/Chest: Effort normal and breath sounds normal.  Neurological: She is alert and oriented to person, place,  and time. No cranial nerve deficit. She exhibits normal muscle tone. Coordination normal.  Skin: She is not diaphoretic.  Psychiatric: She has a normal mood and affect. Her behavior is normal. Judgment and thought content normal.  Vitals reviewed.         Assessment & Plan:  ADD (attention deficit disorder) without hyperactivity  More than 30 minutes was spent during our encounter just in discussion. Therefore physical exam was limited.  However after a long discussion, I believe the patient meets DSM 5 criteria for adult ADD without hyperactivity. I do not see signs of anxiety or depression or bipolar disorder.  Patient agrees after a long discussion. She believes this is however her new job only highlights and emphasizes the problem. We agreed to try low-dose vyvanse 30 mg poqday and recheck in 1 month.

## 2017-02-10 ENCOUNTER — Ambulatory Visit: Payer: BLUE CROSS/BLUE SHIELD | Admitting: Family Medicine

## 2017-02-10 DIAGNOSIS — N764 Abscess of vulva: Secondary | ICD-10-CM | POA: Diagnosis not present

## 2017-02-10 DIAGNOSIS — N909 Noninflammatory disorder of vulva and perineum, unspecified: Secondary | ICD-10-CM | POA: Diagnosis not present

## 2017-02-10 DIAGNOSIS — Z6825 Body mass index (BMI) 25.0-25.9, adult: Secondary | ICD-10-CM | POA: Diagnosis not present

## 2017-04-30 ENCOUNTER — Encounter: Payer: Self-pay | Admitting: Family Medicine

## 2017-04-30 ENCOUNTER — Ambulatory Visit: Payer: BLUE CROSS/BLUE SHIELD | Admitting: Family Medicine

## 2017-04-30 VITALS — BP 138/76 | HR 82 | Temp 98.6°F | Resp 16 | Ht 62.0 in | Wt 142.0 lb

## 2017-04-30 DIAGNOSIS — M545 Low back pain, unspecified: Secondary | ICD-10-CM

## 2017-04-30 DIAGNOSIS — J312 Chronic pharyngitis: Secondary | ICD-10-CM | POA: Diagnosis not present

## 2017-04-30 MED ORDER — LISDEXAMFETAMINE DIMESYLATE 30 MG PO CAPS: 30.0000 mg | ORAL_CAPSULE | Freq: Every day | ORAL | 0 refills | Status: DC

## 2017-04-30 MED ORDER — FLUTICASONE PROPIONATE 50 MCG/ACT NA SUSP: 2.0000 | Freq: Every day | NASAL | 6 refills | Status: DC

## 2017-04-30 MED ORDER — LORATADINE 10 MG PO TABS
10.0000 mg | ORAL_TABLET | Freq: Every day | ORAL | 11 refills | Status: DC
Start: 2017-04-30 — End: 2018-02-03

## 2017-04-30 NOTE — Progress Notes (Signed)
Subjective:    Patient ID: [name], female    DOB: 07-Mar-1986, 32 y.o.   MRN: [id number]  HPI Patient is a very pleasant 32 year old white female who presents with 2 concerns. #1 she's had a sore throat since Thanksgiving. It started after a viral upper respiratory infection. She complains of a constant chronic sore throat. She's constantly having to clear her throat from mucus around her vocal cords. It feels scratchy. She denies any hemoptysis. She denies any hematemesis. She denies any dysphagia. She denies any pain with eating. There is no lymphadenopathy in the neck. There is no weight loss or fever. She does not smoke. She does not use oral tobacco. She has a normal immune system. There is no obvious thrush or oral candidiasis. She does report postnasal drip and sinus drainage. She denies any acid reflux. #2 she reports 3 weeks of low back pain. The pain is located at approximately the level of L5. It is in a bandlike pattern from one side to the other. She denies any numbness or tingling in her legs. She denies any weakness in her legs. She denies any saddle anesthesia. It is made worse by prolonged sitting. It is made better by stretching. It is made worse when she sleeps on her side at night. She denies any hematuria or dysuria or fever. History sounds like low back pain secondary to muscle strain Past Medical History:  Diagnosis Date  . Bartholin cyst    LEFT  . Cervical dysplasia   . Depression    Past Surgical History:  Procedure Laterality Date  . CERVICAL BIOPSY  W/ LOOP ELECTRODE EXCISION  2012  . CESAREAN SECTION     '05; '07  . CHOLECYSTECTOMY  2011  . LAPAROSCOPIC REMOVAL RENAL CYST Left 12/12/2015   Procedure: XI ROBOTIC LEFT RENAL CYST DECORTICATION WITH LEFT NEPHROPEXY;  Surgeon: Sebastian Ache, MD;  Location: [facility];  Service: Urology;  Laterality: Left;  . ORIF CLAVICULAR FRACTURE  06/10/2011   Procedure: OPEN REDUCTION INTERNAL FIXATION (ORIF) CLAVICULAR FRACTURE;   Surgeon: Cammy Copa, MD;  Location: [facility] OR;  Service: Orthopedics;  Laterality: Right;  ORIF Right clavicle   Current Outpatient Medications on File Prior to Visit  Medication Sig Dispense Refill  . SPRINTEC 28 0.25-35 MG-MCG tablet Take 1 tablet by mouth daily.  3   No current facility-administered medications on file prior to visit.    No Known Allergies Social History   Socioeconomic History  . Marital status: Single    Spouse name: Not on file  . Number of children: 2  . Years of education: 60  . Highest education level: Not on file  Social Needs  . Financial resource strain: Not on file  . Food insecurity - worry: Not on file  . Food insecurity - inability: Not on file  . Transportation needs - medical: Not on file  . Transportation needs - non-medical: Not on file  Occupational History  . Occupation: Clinical research associate  Tobacco Use  . Smoking status: Former Smoker    Packs/day: 0.25    Years: 3.00    Pack years: 0.75    Last attempt to quit: 04/21/2009    Years since quitting: 8.0  . Smokeless tobacco: Never Used  . Tobacco comment: smoking was mostly social  Substance and Sexual Activity  . Alcohol use: Yes    Comment: 1-3 glasses of wine a week.  . Drug use: No  . Sexual activity: Yes    Birth  control/protection: Pill  Other Topics Concern  . Not on file  Social History Narrative   Fun: Work and hang out with children   Denies abuse and feels safe at home.       Review of Systems  All other systems reviewed and are negative.      Objective:   Physical Exam  HENT:  Right Ear: External ear normal.  Left Ear: External ear normal.  Nose: Mucosal edema and rhinorrhea present. Right sinus exhibits no maxillary sinus tenderness and no frontal sinus tenderness. Left sinus exhibits no maxillary sinus tenderness and no frontal sinus tenderness.  Mouth/Throat: Oropharynx is clear and moist. No oropharyngeal exudate.  Eyes: Conjunctivae are normal.  Neck: Neck  supple. No tracheal deviation present. No thyromegaly present.  Cardiovascular: Normal rate, regular rhythm and normal heart sounds.  Pulmonary/Chest: Effort normal and breath sounds normal. No stridor. No respiratory distress. She has no wheezes. She has no rales.  Abdominal: Soft. Bowel sounds are normal. She exhibits no distension. There is no tenderness. There is no rebound and no guarding.  Musculoskeletal:       Lumbar back: She exhibits normal range of motion, no tenderness, no bony tenderness, no deformity, no pain and no spasm.       Back:  Lymphadenopathy:    She has no cervical adenopathy.  Vitals reviewed.         Assessment & Plan:  Acute midline low back pain without sciatica - Plan: DG Lumbar Spine Complete, Ambulatory referral to Physical Therapy  Chronic sore throat  I believe the patient has a low back muscle strain. I recommended physical therapy for this. I believe a regimen of stretching will help improve her low back pain. I've also ordered an x-ray of her lumbar spine to rule out underlying skeletal injury. I believe the sore throat is secondary to postnasal drip from likely chronic sinusitis. I have recommended Flonase 2 sprays each nostril daily. I recommended Claritin 10 mg daily. Recheck in 1-2 weeks if no better or sooner if worse

## 2017-05-11 DIAGNOSIS — B300 Keratoconjunctivitis due to adenovirus: Secondary | ICD-10-CM | POA: Diagnosis not present

## 2017-05-28 ENCOUNTER — Telehealth: Payer: Self-pay | Admitting: Pulmonary Disease

## 2017-05-28 DIAGNOSIS — R918 Other nonspecific abnormal finding of lung field: Secondary | ICD-10-CM

## 2017-05-28 NOTE — Telephone Encounter (Signed)
[facility] CT chest w/o contrast March 2019 Order in 03/2017 expired, placing new order today Nothing further needed at this time

## 2017-07-02 ENCOUNTER — Other Ambulatory Visit: Payer: Self-pay

## 2017-07-07 DIAGNOSIS — N911 Secondary amenorrhea: Secondary | ICD-10-CM | POA: Diagnosis not present

## 2017-07-13 ENCOUNTER — Ambulatory Visit: Payer: Self-pay | Admitting: Pulmonary Disease

## 2017-07-13 DIAGNOSIS — Z3481 Encounter for supervision of other normal pregnancy, first trimester: Secondary | ICD-10-CM | POA: Diagnosis not present

## 2017-07-13 DIAGNOSIS — Z3685 Encounter for antenatal screening for Streptococcus B: Secondary | ICD-10-CM | POA: Diagnosis not present

## 2017-07-13 DIAGNOSIS — Z3A09 9 weeks gestation of pregnancy: Secondary | ICD-10-CM | POA: Diagnosis not present

## 2017-07-13 DIAGNOSIS — Z3480 Encounter for supervision of other normal pregnancy, unspecified trimester: Secondary | ICD-10-CM | POA: Diagnosis not present

## 2017-07-13 LAB — OB RESULTS CONSOLE GBS: GBS: POSITIVE

## 2017-07-13 LAB — OB RESULTS CONSOLE GC/CHLAMYDIA
Chlamydia: NEGATIVE
Gonorrhea: NEGATIVE

## 2017-07-13 LAB — OB RESULTS CONSOLE ABO/RH: RH TYPE: POSITIVE

## 2017-07-13 LAB — OB RESULTS CONSOLE HEPATITIS B SURFACE ANTIGEN: Hepatitis B Surface Ag: NEGATIVE

## 2017-07-13 LAB — OB RESULTS CONSOLE RUBELLA ANTIBODY, IGM: Rubella: NON-IMMUNE/NOT IMMUNE

## 2017-07-13 LAB — OB RESULTS CONSOLE ANTIBODY SCREEN: ANTIBODY SCREEN: NEGATIVE

## 2017-07-13 LAB — OB RESULTS CONSOLE RPR: RPR: NONREACTIVE

## 2017-07-13 LAB — OB RESULTS CONSOLE HIV ANTIBODY (ROUTINE TESTING): HIV: NONREACTIVE

## 2017-07-24 DIAGNOSIS — Z113 Encounter for screening for infections with a predominantly sexual mode of transmission: Secondary | ICD-10-CM | POA: Diagnosis not present

## 2017-07-24 DIAGNOSIS — Z3480 Encounter for supervision of other normal pregnancy, unspecified trimester: Secondary | ICD-10-CM | POA: Diagnosis not present

## 2017-07-24 DIAGNOSIS — Z3491 Encounter for supervision of normal pregnancy, unspecified, first trimester: Secondary | ICD-10-CM | POA: Diagnosis not present

## 2017-08-11 DIAGNOSIS — Z3682 Encounter for antenatal screening for nuchal translucency: Secondary | ICD-10-CM | POA: Diagnosis not present

## 2017-08-11 DIAGNOSIS — Z3A13 13 weeks gestation of pregnancy: Secondary | ICD-10-CM | POA: Diagnosis not present

## 2017-08-11 DIAGNOSIS — Z3401 Encounter for supervision of normal first pregnancy, first trimester: Secondary | ICD-10-CM | POA: Diagnosis not present

## 2017-08-17 DIAGNOSIS — Z13228 Encounter for screening for other metabolic disorders: Secondary | ICD-10-CM | POA: Diagnosis not present

## 2017-08-17 DIAGNOSIS — R87610 Atypical squamous cells of undetermined significance on cytologic smear of cervix (ASC-US): Secondary | ICD-10-CM | POA: Diagnosis not present

## 2017-09-21 DIAGNOSIS — Z3480 Encounter for supervision of other normal pregnancy, unspecified trimester: Secondary | ICD-10-CM | POA: Diagnosis not present

## 2017-09-21 DIAGNOSIS — Z3A19 19 weeks gestation of pregnancy: Secondary | ICD-10-CM | POA: Diagnosis not present

## 2017-09-21 DIAGNOSIS — Z363 Encounter for antenatal screening for malformations: Secondary | ICD-10-CM | POA: Diagnosis not present

## 2017-10-20 DIAGNOSIS — Z362 Encounter for other antenatal screening follow-up: Secondary | ICD-10-CM | POA: Diagnosis not present

## 2017-10-26 DIAGNOSIS — G43119 Migraine with aura, intractable, without status migrainosus: Secondary | ICD-10-CM | POA: Diagnosis not present

## 2017-10-26 DIAGNOSIS — H43392 Other vitreous opacities, left eye: Secondary | ICD-10-CM | POA: Diagnosis not present

## 2017-11-17 DIAGNOSIS — Z23 Encounter for immunization: Secondary | ICD-10-CM | POA: Diagnosis not present

## 2017-11-17 DIAGNOSIS — Z3480 Encounter for supervision of other normal pregnancy, unspecified trimester: Secondary | ICD-10-CM | POA: Diagnosis not present

## 2017-12-08 ENCOUNTER — Other Ambulatory Visit (HOSPITAL_COMMUNITY): Payer: Self-pay

## 2017-12-09 ENCOUNTER — Ambulatory Visit (HOSPITAL_COMMUNITY)
Admission: RE | Admit: 2017-12-09 | Discharge: 2017-12-09 | Disposition: A | Discharge status: Home / Self Care | Payer: BLUE CROSS/BLUE SHIELD | Source: Ambulatory Visit | Attending: Obstetrics and Gynecology | Admitting: Obstetrics and Gynecology

## 2017-12-09 DIAGNOSIS — O26899 Other specified pregnancy related conditions, unspecified trimester: Secondary | ICD-10-CM | POA: Insufficient documentation

## 2017-12-09 DIAGNOSIS — O99019 Anemia complicating pregnancy, unspecified trimester: Secondary | ICD-10-CM | POA: Insufficient documentation

## 2017-12-09 MED ORDER — SODIUM CHLORIDE 0.9 % IV SOLN
510.0000 mg | INTRAVENOUS | Status: DC
  Administered 2017-12-09: 510 mg via INTRAVENOUS
  Filled 2017-12-09: qty 17

## 2017-12-09 NOTE — Discharge Instructions (Signed)

## 2017-12-14 ENCOUNTER — Ambulatory Visit (HOSPITAL_COMMUNITY)
Admission: RE | Admit: 2017-12-14 | Discharge: 2017-12-14 | Disposition: A | Discharge status: Home / Self Care | Payer: BLUE CROSS/BLUE SHIELD | Source: Ambulatory Visit | Attending: Obstetrics and Gynecology | Admitting: Obstetrics and Gynecology

## 2017-12-14 DIAGNOSIS — D509 Iron deficiency anemia, unspecified: Secondary | ICD-10-CM | POA: Diagnosis not present

## 2017-12-14 MED ORDER — SODIUM CHLORIDE 0.9 % IV SOLN
510.0000 mg | INTRAVENOUS | Status: AC
  Administered 2017-12-14: 510 mg via INTRAVENOUS
  Filled 2017-12-14: qty 17

## 2017-12-14 MED ORDER — FERUMOXYTOL INJECTION 510 MG/17 ML
510.0000 mg | INTRAVENOUS | Status: DC
  Filled 2017-12-14: qty 17

## 2017-12-29 DIAGNOSIS — D649 Anemia, unspecified: Secondary | ICD-10-CM | POA: Diagnosis not present

## 2018-01-26 ENCOUNTER — Telehealth (HOSPITAL_COMMUNITY): Payer: Self-pay | Admitting: *Deleted

## 2018-01-26 ENCOUNTER — Encounter (HOSPITAL_COMMUNITY): Payer: Self-pay | Admitting: *Deleted

## 2018-01-26 NOTE — Telephone Encounter (Signed)
Preadmission screen  

## 2018-01-27 DIAGNOSIS — Z23 Encounter for immunization: Secondary | ICD-10-CM | POA: Diagnosis not present

## 2018-01-28 ENCOUNTER — Encounter (HOSPITAL_COMMUNITY): Payer: Self-pay

## 2018-02-04 DIAGNOSIS — Z3685 Encounter for antenatal screening for Streptococcus B: Secondary | ICD-10-CM | POA: Diagnosis not present

## 2018-02-08 NOTE — Patient Instructions (Signed)
[name]  02/08/2018   Your procedure is scheduled on:  02/10/2018  Enter through the Main Entrance of [facility] at 0530 AM.  Pick up the phone at the desk and dial [phone]  Call this number if you have problems the morning of surgery:[phone]  Remember:   Do not eat food:(After Midnight) Desps de medianoche.  Do not drink clear liquids: (After Midnight) Desps de medianoche.  Take these medicines the morning of surgery with A SIP OF WATER: none   Do not wear jewelry, make-up or nail polish.  Do not wear lotions, powders, or perfumes. Do not wear deodorant.  Do not shave 48 hours prior to surgery.  Do not bring valuables to the hospital.  [facility] is not   responsible for any belongings or valuables brought to the hospital.  Contacts, dentures or bridgework may not be worn into surgery.  Leave suitcase in the car. After surgery it may be brought to your room.  For patients admitted to the hospital, checkout time is 11:00 AM the day of              discharge.    N/A   Please read over the following fact sheets that you were given:   Surgical Site Infection Prevention

## 2018-02-09 ENCOUNTER — Encounter (HOSPITAL_COMMUNITY)
Admission: RE | Admit: 2018-02-09 | Discharge: 2018-02-09 | Disposition: A | Discharge status: Home / Self Care | Payer: BLUE CROSS/BLUE SHIELD | Source: Ambulatory Visit | Attending: Obstetrics and Gynecology | Admitting: Obstetrics and Gynecology

## 2018-02-09 DIAGNOSIS — Z3A00 Weeks of gestation of pregnancy not specified: Secondary | ICD-10-CM | POA: Diagnosis not present

## 2018-02-09 DIAGNOSIS — O34219 Maternal care for unspecified type scar from previous cesarean delivery: Secondary | ICD-10-CM | POA: Diagnosis not present

## 2018-02-09 DIAGNOSIS — O34211 Maternal care for low transverse scar from previous cesarean delivery: Secondary | ICD-10-CM | POA: Diagnosis not present

## 2018-02-09 DIAGNOSIS — O99824 Streptococcus B carrier state complicating childbirth: Secondary | ICD-10-CM | POA: Diagnosis not present

## 2018-02-09 DIAGNOSIS — Z87891 Personal history of nicotine dependence: Secondary | ICD-10-CM | POA: Diagnosis not present

## 2018-02-09 DIAGNOSIS — Z3A39 39 weeks gestation of pregnancy: Secondary | ICD-10-CM | POA: Diagnosis not present

## 2018-02-09 DIAGNOSIS — O328XX0 Maternal care for other malpresentation of fetus, not applicable or unspecified: Secondary | ICD-10-CM | POA: Diagnosis not present

## 2018-02-09 HISTORY — DX: Unspecified abnormal cytological findings in specimens from vagina: R87.629

## 2018-02-09 LAB — CBC
HCT: 33.6 % — ABNORMAL LOW (ref 36.0–46.0)
Hemoglobin: 11.5 g/dL — ABNORMAL LOW (ref 12.0–15.0)
MCH: 30.7 pg (ref 26.0–34.0)
MCHC: 34.2 g/dL (ref 30.0–36.0)
MCV: 89.8 fL (ref 80.0–100.0)
PLATELETS: 159 10*3/uL (ref 150–400)
RBC: 3.74 MIL/uL — ABNORMAL LOW (ref 3.87–5.11)
RDW: 14.2 % (ref 11.5–15.5)
WBC: 8.3 10*3/uL (ref 4.0–10.5)

## 2018-02-09 NOTE — H&P (Signed)
[name] is a 32 y.o. female presenting for repeat cesarean section. Pregnancy complicated by two previous cesarean sections.  OB History    Gravida  3   Para  2   Term  1   Preterm      AB      Living  2     SAB      TAB      Ectopic      Multiple      Live Births  2          Past Medical History:  Diagnosis Date  . Bartholin cyst    LEFT  . Cervical dysplasia   . Depression   . Vaginal Pap smear, abnormal    Past Surgical History:  Procedure Laterality Date  . CERVICAL BIOPSY  W/ LOOP ELECTRODE EXCISION  2012  . CESAREAN SECTION     '05; '07  . CHOLECYSTECTOMY  2011  . LAPAROSCOPIC REMOVAL RENAL CYST Left 12/12/2015   Procedure: XI ROBOTIC LEFT RENAL CYST DECORTICATION WITH LEFT NEPHROPEXY;  Surgeon: Sebastian Ache, MD;  Location: [facility];  Service: Urology;  Laterality: Left;  . ORIF CLAVICULAR FRACTURE  06/10/2011   Procedure: OPEN REDUCTION INTERNAL FIXATION (ORIF) CLAVICULAR FRACTURE;  Surgeon: Cammy Copa, MD;  Location: [facility] OR;  Service: Orthopedics;  Laterality: Right;  ORIF Right clavicle   Family History: family history includes Cancer in her paternal grandmother; Diabetes in her father; Healthy in her mother; Prostate cancer in her paternal grandfather. Social History:  reports that she quit smoking about 8 years ago. She has a 0.75 pack-year smoking history. She has never used smokeless tobacco. She reports that she drinks alcohol. She reports that she does not use drugs.     Maternal Diabetes: No Genetic Screening: Normal Maternal Ultrasounds/Referrals: Normal Fetal Ultrasounds or other Referrals:  None Maternal Substance Abuse:  No Significant Maternal Medications:  None Significant Maternal Lab Results:  None Other Comments:  None  Review of Systems  Eyes: Negative for blurred vision.  Gastrointestinal: Negative for abdominal pain.  Neurological: Negative for headaches.   History   Last menstrual period  05/15/2017. Exam Physical Exam  Cardiovascular: Normal rate.  Respiratory: Effort normal.  GI: Soft.    Prenatal labs: ABO, Rh: --/--/PENDING (10/22 0944) Antibody: NEG (10/22 0944) Rubella: Nonimmune (03/25 0000) RPR: Nonreactive (03/25 0000)  HBsAg: Negative (03/25 0000)  HIV: Non-reactive (03/25 0000)  GBS: Positive (03/25 0000)   Assessment/Plan: 32 yo G3P2 for repeat cesarean section Risks reviewed with patient including infection, organ damage, bleeding/transfusion-HIV/Hep, DVT/PE, pneumonia.   Roselle Locus II 02/09/2018, 11:41 AM

## 2018-02-10 ENCOUNTER — Inpatient Hospital Stay (HOSPITAL_COMMUNITY)
Admission: AD | Admit: 2018-02-10 | Discharge: 2018-02-12 | DRG: 788 | Disposition: A | Discharge status: Home / Self Care | Payer: BLUE CROSS/BLUE SHIELD | Attending: Obstetrics and Gynecology | Admitting: Obstetrics and Gynecology

## 2018-02-10 ENCOUNTER — Encounter (HOSPITAL_COMMUNITY): Payer: Self-pay | Admitting: *Deleted

## 2018-02-10 ENCOUNTER — Inpatient Hospital Stay (HOSPITAL_COMMUNITY): Payer: BLUE CROSS/BLUE SHIELD | Admitting: Anesthesiology

## 2018-02-10 ENCOUNTER — Encounter (HOSPITAL_COMMUNITY)
Admission: AD | Disposition: A | Discharge status: Home / Self Care | Payer: Self-pay | Source: Home / Self Care | Attending: Obstetrics and Gynecology

## 2018-02-10 ENCOUNTER — Other Ambulatory Visit: Payer: Self-pay

## 2018-02-10 DIAGNOSIS — O99824 Streptococcus B carrier state complicating childbirth: Secondary | ICD-10-CM | POA: Diagnosis present

## 2018-02-10 DIAGNOSIS — O328XX0 Maternal care for other malpresentation of fetus, not applicable or unspecified: Secondary | ICD-10-CM | POA: Diagnosis present

## 2018-02-10 DIAGNOSIS — Z98891 History of uterine scar from previous surgery: Secondary | ICD-10-CM

## 2018-02-10 DIAGNOSIS — Z87891 Personal history of nicotine dependence: Secondary | ICD-10-CM | POA: Diagnosis not present

## 2018-02-10 DIAGNOSIS — O34211 Maternal care for low transverse scar from previous cesarean delivery: Secondary | ICD-10-CM | POA: Diagnosis not present

## 2018-02-10 DIAGNOSIS — O34219 Maternal care for unspecified type scar from previous cesarean delivery: Secondary | ICD-10-CM | POA: Diagnosis not present

## 2018-02-10 DIAGNOSIS — Z3A00 Weeks of gestation of pregnancy not specified: Secondary | ICD-10-CM | POA: Diagnosis not present

## 2018-02-10 DIAGNOSIS — Z3A39 39 weeks gestation of pregnancy: Secondary | ICD-10-CM

## 2018-02-10 LAB — RPR: RPR Ser Ql: NONREACTIVE

## 2018-02-10 SURGERY — Surgical Case
Anesthesia: Spinal

## 2018-02-10 MED ORDER — ZOLPIDEM TARTRATE 5 MG PO TABS: 5.0000 mg | ORAL_TABLET | Freq: Every evening | ORAL | Status: DC | PRN

## 2018-02-10 MED ORDER — PHENYLEPHRINE 8 MG IN D5W 100 ML (0.08MG/ML) PREMIX OPTIME
INJECTION | INTRAVENOUS | Status: DC | PRN
  Administered 2018-02-10: 60 ug/min via INTRAVENOUS

## 2018-02-10 MED ORDER — OXYTOCIN 10 UNIT/ML IJ SOLN
INTRAMUSCULAR | Status: AC
  Filled 2018-02-10: qty 4

## 2018-02-10 MED ORDER — CEFAZOLIN SODIUM-DEXTROSE 2-4 GM/100ML-% IV SOLN
2.0000 g | INTRAVENOUS | Status: AC
  Administered 2018-02-10: 2 g via INTRAVENOUS
  Filled 2018-02-10: qty 100

## 2018-02-10 MED ORDER — NALBUPHINE HCL 10 MG/ML IJ SOLN: 5.0000 mg | Freq: Once | INTRAMUSCULAR | Status: DC | PRN

## 2018-02-10 MED ORDER — HYDROMORPHONE HCL 1 MG/ML IJ SOLN: 0.2500 mg | INTRAMUSCULAR | Status: DC | PRN

## 2018-02-10 MED ORDER — NALOXONE HCL 0.4 MG/ML IJ SOLN: 0.4000 mg | INTRAMUSCULAR | Status: DC | PRN

## 2018-02-10 MED ORDER — DIPHENHYDRAMINE HCL 25 MG PO CAPS
25.0000 mg | ORAL_CAPSULE | ORAL | Status: DC | PRN
  Filled 2018-02-10: qty 1

## 2018-02-10 MED ORDER — SCOPOLAMINE 1 MG/3DAYS TD PT72
MEDICATED_PATCH | TRANSDERMAL | Status: AC
  Filled 2018-02-10: qty 1

## 2018-02-10 MED ORDER — SODIUM CHLORIDE 0.9 % IR SOLN
Status: DC | PRN
  Administered 2018-02-10: 1000 mL

## 2018-02-10 MED ORDER — LACTATED RINGERS IV SOLN: INTRAVENOUS | Status: DC | PRN

## 2018-02-10 MED ORDER — NALOXONE HCL 4 MG/10ML IJ SOLN
1.0000 ug/kg/h | INTRAVENOUS | Status: DC | PRN
  Filled 2018-02-10: qty 5

## 2018-02-10 MED ORDER — OXYCODONE HCL 5 MG PO TABS
5.0000 mg | ORAL_TABLET | ORAL | Status: DC | PRN
  Administered 2018-02-10 – 2018-02-11 (×4): 5 mg via ORAL
  Filled 2018-02-10 (×2): qty 1

## 2018-02-10 MED ORDER — KETOROLAC TROMETHAMINE 30 MG/ML IJ SOLN
30.0000 mg | Freq: Four times a day (QID) | INTRAMUSCULAR | Status: AC | PRN
  Administered 2018-02-10: 30 mg via INTRAVENOUS
  Filled 2018-02-10: qty 1

## 2018-02-10 MED ORDER — BUPIVACAINE IN DEXTROSE 0.75-8.25 % IT SOLN
INTRATHECAL | Status: DC | PRN
  Administered 2018-02-10: 1.4 mL via INTRATHECAL

## 2018-02-10 MED ORDER — KETOROLAC TROMETHAMINE 30 MG/ML IJ SOLN
INTRAMUSCULAR | Status: AC
  Administered 2018-02-10: 30 mg via INTRAVENOUS
  Filled 2018-02-10: qty 1

## 2018-02-10 MED ORDER — OXYTOCIN 10 UNIT/ML IJ SOLN
INTRAVENOUS | Status: DC | PRN
  Administered 2018-02-10: 40 [IU] via INTRAVENOUS

## 2018-02-10 MED ORDER — SOD CITRATE-CITRIC ACID 500-334 MG/5ML PO SOLN
30.0000 mL | ORAL | Status: AC
  Administered 2018-02-10: 30 mL via ORAL

## 2018-02-10 MED ORDER — SIMETHICONE 80 MG PO CHEW
80.0000 mg | CHEWABLE_TABLET | Freq: Three times a day (TID) | ORAL | Status: DC
  Administered 2018-02-10 – 2018-02-12 (×5): 80 mg via ORAL
  Filled 2018-02-10 (×4): qty 1

## 2018-02-10 MED ORDER — MENTHOL 3 MG MT LOZG: 1.0000 | LOZENGE | OROMUCOSAL | Status: DC | PRN

## 2018-02-10 MED ORDER — OXYCODONE HCL 5 MG PO TABS
10.0000 mg | ORAL_TABLET | ORAL | Status: DC | PRN
  Administered 2018-02-11 – 2018-02-12 (×6): 10 mg via ORAL
  Filled 2018-02-10 (×7): qty 2

## 2018-02-10 MED ORDER — PRENATAL MULTIVITAMIN CH
1.0000 | ORAL_TABLET | Freq: Every day | ORAL | Status: DC
  Administered 2018-02-10 – 2018-02-12 (×3): 1 via ORAL
  Filled 2018-02-10 (×2): qty 1

## 2018-02-10 MED ORDER — ONDANSETRON HCL 4 MG/2ML IJ SOLN: 4.0000 mg | Freq: Three times a day (TID) | INTRAMUSCULAR | Status: DC | PRN

## 2018-02-10 MED ORDER — ONDANSETRON HCL 4 MG/2ML IJ SOLN
INTRAMUSCULAR | Status: AC
  Filled 2018-02-10: qty 2

## 2018-02-10 MED ORDER — OXYTOCIN 40 UNITS IN LACTATED RINGERS INFUSION - SIMPLE MED: 2.5000 [IU]/h | INTRAVENOUS | Status: AC

## 2018-02-10 MED ORDER — TETANUS-DIPHTH-ACELL PERTUSSIS 5-2.5-18.5 LF-MCG/0.5 IM SUSP: 0.5000 mL | Freq: Once | INTRAMUSCULAR | Status: DC

## 2018-02-10 MED ORDER — PROMETHAZINE HCL 25 MG/ML IJ SOLN: 6.2500 mg | INTRAMUSCULAR | Status: DC | PRN

## 2018-02-10 MED ORDER — LACTATED RINGERS IV SOLN
INTRAVENOUS | Status: DC | PRN
  Administered 2018-02-10: 08:00:00 via INTRAVENOUS

## 2018-02-10 MED ORDER — SENNOSIDES-DOCUSATE SODIUM 8.6-50 MG PO TABS
2.0000 | ORAL_TABLET | ORAL | Status: DC
  Administered 2018-02-10 – 2018-02-12 (×2): 2 via ORAL
  Filled 2018-02-10 (×2): qty 2

## 2018-02-10 MED ORDER — COCONUT OIL OIL: 1.0000 "application " | TOPICAL_OIL | Status: DC | PRN

## 2018-02-10 MED ORDER — SIMETHICONE 80 MG PO CHEW
80.0000 mg | CHEWABLE_TABLET | ORAL | Status: DC
  Administered 2018-02-10 – 2018-02-12 (×2): 80 mg via ORAL
  Filled 2018-02-10 (×2): qty 1

## 2018-02-10 MED ORDER — MORPHINE SULFATE (PF) 0.5 MG/ML IJ SOLN
INTRAMUSCULAR | Status: AC
  Filled 2018-02-10: qty 10

## 2018-02-10 MED ORDER — LACTATED RINGERS IV SOLN
INTRAVENOUS | Status: DC | PRN
  Administered 2018-02-10: 07:00:00 via INTRAVENOUS

## 2018-02-10 MED ORDER — MORPHINE SULFATE (PF) 0.5 MG/ML IJ SOLN
INTRAMUSCULAR | Status: DC | PRN
  Administered 2018-02-10: .15 mg via EPIDURAL

## 2018-02-10 MED ORDER — DEXAMETHASONE SODIUM PHOSPHATE 4 MG/ML IJ SOLN
INTRAMUSCULAR | Status: DC | PRN
  Administered 2018-02-10: 4 mg via INTRAVENOUS

## 2018-02-10 MED ORDER — KETOROLAC TROMETHAMINE 30 MG/ML IJ SOLN: 30.0000 mg | Freq: Once | INTRAMUSCULAR | Status: DC | PRN

## 2018-02-10 MED ORDER — WITCH HAZEL-GLYCERIN EX PADS: 1.0000 "application " | MEDICATED_PAD | CUTANEOUS | Status: DC | PRN

## 2018-02-10 MED ORDER — MEPERIDINE HCL 25 MG/ML IJ SOLN: 6.2500 mg | INTRAMUSCULAR | Status: DC | PRN

## 2018-02-10 MED ORDER — KETOROLAC TROMETHAMINE 30 MG/ML IJ SOLN
30.0000 mg | Freq: Four times a day (QID) | INTRAMUSCULAR | Status: AC | PRN
  Administered 2018-02-10: 30 mg via INTRAMUSCULAR

## 2018-02-10 MED ORDER — NALBUPHINE HCL 10 MG/ML IJ SOLN: 5.0000 mg | INTRAMUSCULAR | Status: DC | PRN

## 2018-02-10 MED ORDER — SODIUM CHLORIDE 0.9% FLUSH: 3.0000 mL | INTRAVENOUS | Status: DC | PRN

## 2018-02-10 MED ORDER — ACETAMINOPHEN 325 MG PO TABS
650.0000 mg | ORAL_TABLET | ORAL | Status: DC | PRN
  Administered 2018-02-11 – 2018-02-12 (×3): 650 mg via ORAL
  Filled 2018-02-10 (×3): qty 2

## 2018-02-10 MED ORDER — SIMETHICONE 80 MG PO CHEW: 80.0000 mg | CHEWABLE_TABLET | ORAL | Status: DC | PRN

## 2018-02-10 MED ORDER — ONDANSETRON HCL 4 MG/2ML IJ SOLN
INTRAMUSCULAR | Status: DC | PRN
  Administered 2018-02-10: 4 mg via INTRAVENOUS

## 2018-02-10 MED ORDER — DEXAMETHASONE SODIUM PHOSPHATE 4 MG/ML IJ SOLN
INTRAMUSCULAR | Status: AC
  Filled 2018-02-10: qty 1

## 2018-02-10 MED ORDER — DIBUCAINE 1 % RE OINT: 1.0000 "application " | TOPICAL_OINTMENT | RECTAL | Status: DC | PRN

## 2018-02-10 MED ORDER — FENTANYL CITRATE (PF) 100 MCG/2ML IJ SOLN
INTRAMUSCULAR | Status: DC | PRN
  Administered 2018-02-10: 15 ug via INTRAVENOUS

## 2018-02-10 MED ORDER — IBUPROFEN 600 MG PO TABS
600.0000 mg | ORAL_TABLET | Freq: Four times a day (QID) | ORAL | Status: DC
  Administered 2018-02-10 – 2018-02-12 (×7): 600 mg via ORAL
  Filled 2018-02-10 (×7): qty 1

## 2018-02-10 MED ORDER — DIPHENHYDRAMINE HCL 25 MG PO CAPS: 25.0000 mg | ORAL_CAPSULE | Freq: Four times a day (QID) | ORAL | Status: DC | PRN

## 2018-02-10 MED ORDER — ACETAMINOPHEN 500 MG PO TABS
1000.0000 mg | ORAL_TABLET | Freq: Four times a day (QID) | ORAL | Status: AC
  Administered 2018-02-10 – 2018-02-11 (×4): 1000 mg via ORAL
  Filled 2018-02-10 (×4): qty 2

## 2018-02-10 MED ORDER — SCOPOLAMINE 1 MG/3DAYS TD PT72: 1.0000 | MEDICATED_PATCH | Freq: Once | TRANSDERMAL | Status: DC

## 2018-02-10 MED ORDER — SOD CITRATE-CITRIC ACID 500-334 MG/5ML PO SOLN
ORAL | Status: AC
  Filled 2018-02-10: qty 15

## 2018-02-10 MED ORDER — FENTANYL CITRATE (PF) 100 MCG/2ML IJ SOLN
INTRAMUSCULAR | Status: AC
  Filled 2018-02-10: qty 2

## 2018-02-10 MED ORDER — DIPHENHYDRAMINE HCL 50 MG/ML IJ SOLN: 12.5000 mg | INTRAMUSCULAR | Status: DC | PRN

## 2018-02-10 MED ORDER — SCOPOLAMINE 1 MG/3DAYS TD PT72
1.0000 | MEDICATED_PATCH | Freq: Once | TRANSDERMAL | Status: DC
  Administered 2018-02-10: 1.5 mg via TRANSDERMAL

## 2018-02-10 MED ORDER — LACTATED RINGERS IV SOLN
INTRAVENOUS | Status: DC
  Administered 2018-02-10 (×2): via INTRAVENOUS

## 2018-02-10 SURGICAL SUPPLY — 36 items
ADH SKN CLS APL DERMABOND .7 (GAUZE/BANDAGES/DRESSINGS)
APL SKNCLS STERI-STRIP NONHPOA (GAUZE/BANDAGES/DRESSINGS) ×1
BENZOIN TINCTURE PRP APPL 2/3 (GAUZE/BANDAGES/DRESSINGS) ×2 IMPLANT
CHLORAPREP W/TINT 26ML (MISCELLANEOUS) ×2 IMPLANT
CLAMP CORD UMBIL (MISCELLANEOUS) IMPLANT
CLOSURE STERI STRIP 1/2 X4 (GAUZE/BANDAGES/DRESSINGS) ×2 IMPLANT
CLOTH BEACON ORANGE TIMEOUT ST (SAFETY) ×2 IMPLANT
DERMABOND ADVANCED (GAUZE/BANDAGES/DRESSINGS)
DERMABOND ADVANCED .7 DNX12 (GAUZE/BANDAGES/DRESSINGS) IMPLANT
DRSG OPSITE POSTOP 4X10 (GAUZE/BANDAGES/DRESSINGS) ×2 IMPLANT
ELECT REM PT RETURN 9FT ADLT (ELECTROSURGICAL) ×2
ELECTRODE REM PT RTRN 9FT ADLT (ELECTROSURGICAL) ×1 IMPLANT
EXTRACTOR VACUUM M CUP 4 TUBE (SUCTIONS) IMPLANT
GLOVE BIO SURGEON STRL SZ7.5 (GLOVE) ×2 IMPLANT
GLOVE BIOGEL PI IND STRL 7.0 (GLOVE) ×1 IMPLANT
GLOVE BIOGEL PI INDICATOR 7.0 (GLOVE) ×1
GOWN STRL REUS W/TWL LRG LVL3 (GOWN DISPOSABLE) ×4 IMPLANT
KIT ABG SYR 3ML LUER SLIP (SYRINGE) ×2 IMPLANT
NEEDLE HYPO 25X5/8 SAFETYGLIDE (NEEDLE) ×2 IMPLANT
NS IRRIG 1000ML POUR BTL (IV SOLUTION) ×2 IMPLANT
PACK C SECTION WH (CUSTOM PROCEDURE TRAY) ×2 IMPLANT
PAD OB MATERNITY 4.3X12.25 (PERSONAL CARE ITEMS) ×2 IMPLANT
PENCIL SMOKE EVAC W/HOLSTER (ELECTROSURGICAL) ×2 IMPLANT
SPONGE LAP 18X18 RF (DISPOSABLE) ×6 IMPLANT
STRIP CLOSURE SKIN 1/2X4 (GAUZE/BANDAGES/DRESSINGS) IMPLANT
SUT CHROMIC 2 0 SH (SUTURE) ×2 IMPLANT
SUT MNCRL 0 VIOLET CTX 36 (SUTURE) ×4 IMPLANT
SUT MONOCRYL 0 CTX 36 (SUTURE) ×4
SUT PDS AB 0 CTX 60 (SUTURE) ×2 IMPLANT
SUT PLAIN 0 NONE (SUTURE) IMPLANT
SUT PLAIN 2 0 (SUTURE) ×2
SUT PLAIN 2 0 XLH (SUTURE) IMPLANT
SUT PLAIN ABS 2-0 CT1 27XMFL (SUTURE) ×1 IMPLANT
SUT VIC AB 4-0 KS 27 (SUTURE) ×2 IMPLANT
TOWEL OR 17X24 6PK STRL BLUE (TOWEL DISPOSABLE) ×2 IMPLANT
TRAY FOLEY W/BAG SLVR 14FR LF (SET/KITS/TRAYS/PACK) ×2 IMPLANT

## 2018-02-10 NOTE — Brief Op Note (Signed)
02/10/2018  8:17 AM  PATIENT:  [name]  32 y.o. female  PRE-OPERATIVE DIAGNOSIS:  previous cesarean section   POST-OPERATIVE DIAGNOSIS:  Desires repeat cesarean section            Footling breech  PROCEDURE:  Procedure(s) with comments: REPEAT CESAREAN SECTION (N/A) - Repeat edc 02/14/18 NKDA Heather,  RNFA  SURGEON:  Surgeon(s) and Role:    * Harold Hedge, MD - Primary  PHYSICIAN ASSISTANT:   ASSISTANTS: Webb Silversmith RNFA   ANESTHESIA:   spinal  EBL:  Per anesthesia record   BLOOD ADMINISTERED:none  DRAINS: Urinary Catheter (Foley)   LOCAL MEDICATIONS USED:  NONE  SPECIMEN:  No Specimen  DISPOSITION OF SPECIMEN:  N/A  COUNTS:  YES  TOURNIQUET:  * No tourniquets in log *  DICTATION: .Other Dictation: Dictation Number [id number]  PLAN OF CARE: Admit to inpatient   PATIENT DISPOSITION:  PACU - hemodynamically stable.   Delay start of Pharmacological VTE agent (>24hrs) due to surgical blood loss or risk of bleeding: not applicable

## 2018-02-10 NOTE — Lactation Note (Addendum)
This note was copied from a baby's chart. Lactation Consultation Note Baby 30 hrs old.  Mom has flat nipples. RN gave #24 NS. Mom stated NS keeps falling off. Mom wasn't using correct application. LC fitted w/#20 NS. Demonstrated application of NS, mom demonstrated application several times. Taught "C" hold for latching. Encouraged football hold while using NS.  Gave mom hand pump to pre-pump to evert nipples prior to application of NS. Shells given to mom to wear in am.  Discussed using DEBP for stimulation d/t wearing NS. Mom states she is breast/formula feeding. Encouraged to BF before giving formula. FOB stating he doesn't want his baby to go hungry that they have a lot of formula at home.  Supply and demand reviewed. Newborn behavior, STS, I&O, cluster feeding, breast massage discussed. FOB walking the floor holding the baby. Appeared to be falling asleep while walking. LC encouraged FOB to place baby in crib d/t he was tired and needed to sleep.  FOB and mom agreed. LC DEBP and kit into rm. Hand pump given as well. Will reviewed set up after parents has rested. [place] brochure given w/resources, support groups and [place] services. Patient Name: [name] Date: 02/10/2018 Reason for consult: Initial assessment;Maternal endocrine disorder   Maternal Data Has patient been taught Hand Expression?: Yes Does the patient have breastfeeding experience prior to this delivery?: No  Feeding Feeding Type: Breast Fed  LATCH Score Latch: Repeated attempts needed to sustain latch, nipple held in mouth throughout feeding, stimulation needed to elicit sucking reflex.  Audible Swallowing: None  Type of Nipple: Flat  Comfort (Breast/Nipple): Soft / non-tender  Hold (Positioning): Assistance needed to correctly position infant at breast and maintain latch.  LATCH Score: 5  Interventions Interventions: Breast feeding basics reviewed;Breast massage;Hand express;Shells;Pre-pump if  needed;Hand pump;Breast compression;DEBP  Lactation Tools Discussed/Used Tools: Shells;Pump;Nipple Shields Nipple shield size: 20 Shell Type: Inverted Breast pump type: Double-Electric Breast Pump;Manual Pump Review: Milk Storage Initiated by:: ([place] took pumps and kit to rm. mom going to sleep. )   Consult Status Consult Status: Follow-up Date: 02/11/18 Follow-up type: In-patient    Theodoro Kalata 02/10/2018, 11:58 PM

## 2018-02-10 NOTE — Anesthesia Preprocedure Evaluation (Signed)
Anesthesia Evaluation  Patient identified by MRN, date of birth, ID band Patient awake    Reviewed: Allergy & Precautions, H&P , NPO status , Patient's Chart, lab work & pertinent test results  Airway Mallampati: I  TM Distance: >3 FB Neck ROM: full    Dental no notable dental hx.    Pulmonary former smoker,    Pulmonary exam normal breath sounds clear to auscultation       Cardiovascular negative cardio ROS   Rhythm:regular Rate:Normal     Neuro/Psych negative neurological ROS     GI/Hepatic negative GI ROS, Neg liver ROS,   Endo/Other  negative endocrine ROS  Renal/GU      Musculoskeletal   Abdominal Normal abdominal exam  (+)   Peds  Hematology negative hematology ROS (+)   Anesthesia Other Findings   Reproductive/Obstetrics (+) Pregnancy                             Anesthesia Physical Anesthesia Plan  ASA: II  Anesthesia Plan: Spinal   Post-op Pain Management:    Induction:   PONV Risk Score and Plan: 3 and Ondansetron, Dexamethasone and Scopolamine patch - Pre-op  Airway Management Planned: Natural Airway and Nasal Cannula  Additional Equipment:   Intra-op Plan:   Post-operative Plan:   Informed Consent: I have reviewed the patients History and Physical, chart, labs and discussed the procedure including the risks, benefits and alternatives for the proposed anesthesia with the patient or authorized representative who has indicated his/her understanding and acceptance.     Plan Discussed with: CRNA and Surgeon  Anesthesia Plan Comments:         Anesthesia Quick Evaluation

## 2018-02-10 NOTE — Anesthesia Postprocedure Evaluation (Signed)
Anesthesia Post Note  Patient: [name]  Procedure(s) Performed: REPEAT CESAREAN SECTION (N/A )     Patient location during evaluation: PACU Anesthesia Type: Spinal Level of consciousness: awake Pain management: pain level controlled Vital Signs Assessment: post-procedure vital signs reviewed and stable Respiratory status: spontaneous breathing Postop Assessment: no headache, no backache, spinal receding, patient able to bend at knees and no apparent nausea or vomiting Anesthetic complications: no    Last Vitals:  Vitals:   02/10/18 1001 02/10/18 1105  BP: 105/66 100/75  Pulse: 80 78  Resp: 18 18  Temp: 36.7 C 37.3 C  SpO2: 99% 100%    Last Pain:  Vitals:   02/10/18 1105  TempSrc: Axillary  PainSc: 6    Pain Goal:                 Caren Macadam

## 2018-02-10 NOTE — Op Note (Signed)
NAME: [name], RUNNER MEDICAL RECORD [id number] ACCOUNT [id number] DATE OF BIRTH:10/29/1985 FACILITY: [facility] LOCATION: [facility] PHYSICIAN:Daekwon Beswick Jamal Collin II, MD  OPERATIVE REPORT  DATE OF PROCEDURE:  02/10/2018  PREOPERATIVE DIAGNOSIS:  Previous cesarean section, desires repeat.  POSTOPERATIVE DIAGNOSES: 1.  Previous cesarean section, desires repeat. 2.  Footling breech.  PROCEDURE:  Repeat low transverse cesarean section.  SURGEON:  Harold Hedge, II, M.D.  ANESTHESIA:  Spinal.  Leilani Able, M.D.  ESTIMATED BLOOD LOSS:  Per anesthesia record.  SPECIMENS:  None.  FINDINGS:  Viable female infant.  Apgars arterial cord pH birth weight pending.  INDICATIONS AND CONSENT:  This patient is a 32 year old G3 P2 at 39-3/7 weeks.  She has had 2 previous cesarean sections and desires repeat.  Potential risks and complications were reviewed preoperatively including but not limited to infection, organ  damage, bleeding requiring transfusion of blood products with HIV and hepatitis acquisition, DVT, PE, pneumonia and  return to the operating room.  All questions were answered.  The patient's danger she understands and agrees and consent was signed on  the chart.  DESCRIPTION OF PROCEDURE:  The patient was taken to the operating room where she was identified.  Spinal anesthetic was placed per Dr. Arby Barrette and she was placed in the dorsal supine position with a 15 degree left lateral wedge.  She was prepped  vaginally with Betadine.  Foley catheter was placed and prepped abdominally with ChloraPrep.  After 3 minute drying time, a timeout was undertaken and then she was draped in a sterile fashion.  After testing for adequate spinal anesthesia, skin was  entered through a Pfannenstiel incision through the scar from her previous cesarean sections.  The subcutaneous layers are scarred down.  However, the peritoneum was entered without difficulty and taken down superiorly and inferiorly.   Vesicouterine  peritoneum was taken down where it was adherent about halfway up the anterior uterine fundus.  It was dissected down well and the bladder blade was placed.  Uterus was then incised in a low transverse manner and the uterine cavity was entered bluntly  with a hemostat.  The incision was extended with the fingers.  Baby was then delivered from the double footling breech position without difficulty.  Good cry and tone is noted.  After 1 minute wait the cord was clamped and cut and the baby was handed to  waiting pediatrics team.  Placenta was manually delivered.  Uterus was exteriorized.  The uterus was cleaned.  It is then closed in 2 running locking imbricating layers of 0 Monocryl suture.  Tubes and ovaries are normal and the uterus was returned to  the peritoneal cavity.  Lavage was carried out.  Minor bleeders on the incision are ligated with 2-0 chromic.  Good hemostasis was noted.  The anterior peritoneum was closed in a running fashion with 0 Monocryl suture, which was also used to  reapproximate the pyramidalis muscle in the midline.  Anterior rectus fascia was closed in a running fashion with a 0 looped PDS.  Subcutaneous layer was closed with interrupted plain and the skin was closed in a subcuticular fashion with 4-0 Vicryl on a  Keith needle.  Benzoin and Steri-Strips.  Pressure dressing was applied.  All counts were correct.    The patient was taken to the recovery room in stable condition.  AN/NUANCE  D:02/10/2018 T:02/10/2018 JOB:[id number]

## 2018-02-10 NOTE — Anesthesia Procedure Notes (Signed)
Spinal  Patient location during procedure: OR Start time: 02/10/2018 7:27 AM End time: 02/10/2018 7:32 AM Staffing Anesthesiologist: Leilani Able, MD Resident/CRNA: Cleda Clarks, CRNA Other anesthesia staff: Macie Burows, RN Performed: other anesthesia staff  Preanesthetic Checklist Completed: patient identified, site marked, surgical consent, pre-op evaluation, timeout performed, IV checked, risks and benefits discussed and monitors and equipment checked Spinal Block Patient position: sitting Prep: ChloraPrep and site prepped and draped Patient monitoring: heart rate and continuous pulse ox Approach: midline Location: L3-4 Injection technique: single-shot Needle Needle type: Pencan  Needle gauge: 24 G Needle length: 10 cm Assessment Sensory level: T4

## 2018-02-10 NOTE — Addendum Note (Signed)
Addendum  created 02/10/18 1607 by Orlie Pollen, CRNA   Sign clinical note

## 2018-02-10 NOTE — Transfer of Care (Signed)
Immediate Anesthesia Transfer of Care Note  Patient: [name]  Procedure(s) Performed: REPEAT CESAREAN SECTION (N/A )  Patient Location: PACU  Anesthesia Type:Spinal  Level of Consciousness: awake, alert  and oriented  Airway & Oxygen Therapy: Patient Spontanous Breathing and Patient connected to nasal cannula oxygen  Post-op Assessment: Report given to RN and Post -op Vital signs reviewed and stable  Post vital signs: Reviewed and stable  Last Vitals:  Vitals Value Taken Time  BP    Temp    Pulse    Resp    SpO2      Last Pain:  Vitals:   02/10/18 0559  TempSrc: Oral  PainSc: 0-No pain         Complications: No apparent anesthesia complications

## 2018-02-10 NOTE — Progress Notes (Signed)
No changes to H&P per patient history D/W patient procedure-repeat cesarean secction All questions answered Patient states she understands and agrees

## 2018-02-10 NOTE — Anesthesia Postprocedure Evaluation (Signed)
Anesthesia Post Note  Patient: [name]  Procedure(s) Performed: REPEAT CESAREAN SECTION (N/A )     Patient location during evaluation: Mother Baby Anesthesia Type: Spinal Level of consciousness: oriented and awake and alert Pain management: pain level controlled Vital Signs Assessment: post-procedure vital signs reviewed and stable Respiratory status: spontaneous breathing, respiratory function stable and patient connected to nasal cannula oxygen Cardiovascular status: blood pressure returned to baseline and stable Postop Assessment: no headache, no backache and no apparent nausea or vomiting Anesthetic complications: no    Last Vitals:  Vitals:   02/10/18 1205 02/10/18 1305  BP: 103/69 100/63  Pulse: 84 78  Resp: 18 18  Temp: 37.2 C 36.7 C  SpO2: 98% 99%    Last Pain:  Vitals:   02/10/18 1539  TempSrc:   PainSc: 7    Pain Goal: Patients Stated Pain Goal: 3 (02/10/18 1539)               Marrion Coy

## 2018-02-11 LAB — BIRTH TISSUE RECOVERY COLLECTION (PLACENTA DONATION)

## 2018-02-11 LAB — CBC
HEMATOCRIT: 23.4 % — AB (ref 36.0–46.0)
Hemoglobin: 8.2 g/dL — ABNORMAL LOW (ref 12.0–15.0)
MCH: 31.3 pg (ref 26.0–34.0)
MCHC: 35 g/dL (ref 30.0–36.0)
MCV: 89.3 fL (ref 80.0–100.0)
PLATELETS: 123 10*3/uL — AB (ref 150–400)
RBC: 2.62 MIL/uL — ABNORMAL LOW (ref 3.87–5.11)
RDW: 13.6 % (ref 11.5–15.5)
WBC: 11.6 10*3/uL — ABNORMAL HIGH (ref 4.0–10.5)
nRBC: 0 % (ref 0.0–0.2)

## 2018-02-11 NOTE — Progress Notes (Signed)
Subjective: Postpartum Day 1: Cesarean Delivery Patient reports tolerating PO and no problems voiding.    Objective: Vital signs in last 24 hours: Temp:  [97.7 F (36.5 C)-99.1 F (37.3 C)] 98.2 F (36.8 C) (10/24 0900) Pulse Rate:  [59-90] 90 (10/24 0900) Resp:  [16-19] 16 (10/24 0900) BP: (94-106)/(63-79) 106/76 (10/24 0900) SpO2:  [97 %-100 %] 99 % (10/24 0900)  Physical Exam:  General: alert, cooperative, appears stated age and no distress Lochia: appropriate Uterine Fundus: firm Incision: healing well DVT Evaluation: No evidence of DVT seen on physical exam.  Recent Labs    02/09/18 0944 02/11/18 0551  HGB 11.5* 8.2*  HCT 33.6* 23.4*    Assessment/Plan: Status post Cesarean section. Doing well postoperatively.  Continue current care Recheck CBC in am.  [name] 02/11/2018, 9:17 AM

## 2018-02-11 NOTE — Lactation Note (Signed)
This note was copied from a baby's chart. Lactation Consultation Note  Patient Name: [name] [id number] Date: 02/11/2018 Reason for consult: Follow-up assessment;Difficult latch;Term Assisted with positioning baby in cross cradle hold.  Baby latched but unable to sustain latch.  20 mm nipple shield applied but latch shallow and cheek dimpling noted.  No swallows.  Discussed supplementing baby at breast with formula and parents agreeable.  5 french feeding tube placed under shield and filled with formula.  Baby obtained a deeper latch and suck improved.  Baby took 12 mls of formula and came off breast relaxed.  Encouraged mom to post pump.  Instructed to feed with cues using 5 french at breast under nipple shield, supplement with formula per volume guidelines and post pump every 3 hours.  Maternal Data    Feeding Feeding Type: Formula  LATCH Score Latch: Grasps breast easily, tongue down, lips flanged, rhythmical sucking.(with nipple shield)  Audible Swallowing: A few with stimulation  Type of Nipple: Everted at rest and after stimulation  Comfort (Breast/Nipple): Soft / non-tender  Hold (Positioning): Assistance needed to correctly position infant at breast and maintain latch.  LATCH Score: 8  Interventions Interventions: Assisted with latch;Breast compression;Shells;Skin to skin;Adjust position;Breast massage;Support pillows;Pre-pump if needed  Lactation Tools Discussed/Used Tools: 8F feeding tube / Syringe;Nipple Shields Nipple shield size: 20 Shell Type: Inverted Breast pump type: Double-Electric Breast Pump   Consult Status Consult Status: Follow-up Date: 02/12/18 Follow-up type: In-patient    Huston Foley 02/11/2018, 1:42 PM

## 2018-02-11 NOTE — Progress Notes (Signed)
LTSC dressing change is done with asceptic technique. Pt tolerated procedure well.

## 2018-02-11 NOTE — Progress Notes (Signed)
CSW met with MOB via bedside to provide any support needed. MOB stated this will be her 3 child and has 2 other children at home with her. MOB voiced being excited to get home. MOB voiced no concerns at this time of anxiety/ depression and stated she had an easy delivery. CSW voiced the importance of reaching out to family/ friends in the event she begins to feel anxious/ depressed.   CSW provided education regarding Baby Blues vs PMADs and provided MOB with resources for mental health follow up.  CSW encouraged MOB to evaluate her mental health throughout the postpartum period with the use of the New Mom Checklist developed by Postpartum Progress as well as the [place] Postnatal Depression Scale and notify a medical professional if symptoms arise.    No other concerns voiced by MOB at this time.   Kingsley Spittle, [place]  [phone]

## 2018-02-12 LAB — CBC
HCT: 26.4 % — ABNORMAL LOW (ref 36.0–46.0)
Hemoglobin: 9.3 g/dL — ABNORMAL LOW (ref 12.0–15.0)
MCH: 31.5 pg (ref 26.0–34.0)
MCHC: 35.2 g/dL (ref 30.0–36.0)
MCV: 89.5 fL (ref 80.0–100.0)
PLATELETS: 130 10*3/uL — AB (ref 150–400)
RBC: 2.95 MIL/uL — AB (ref 3.87–5.11)
RDW: 13.9 % (ref 11.5–15.5)
WBC: 9.4 10*3/uL (ref 4.0–10.5)
nRBC: 0 % (ref 0.0–0.2)

## 2018-02-12 MED ORDER — IBUPROFEN 600 MG PO TABS: 600.0000 mg | ORAL_TABLET | Freq: Four times a day (QID) | ORAL | 0 refills | Status: DC

## 2018-02-12 MED ORDER — OXYCODONE-ACETAMINOPHEN 5-325 MG PO TABS: 1.0000 | ORAL_TABLET | ORAL | 0 refills | Status: DC | PRN

## 2018-02-12 NOTE — Discharge Summary (Signed)
Obstetric Discharge Summary Reason for Admission: cesarean section Prenatal Procedures: none Intrapartum Procedures: cesarean: low cervical, transverse Postpartum Procedures: none Complications-Operative and Postpartum: none Hemoglobin  Date Value Ref Range Status  02/12/2018 9.3 (L) 12.0 - 15.0 g/dL Final   HCT  Date Value Ref Range Status  02/12/2018 26.4 (L) 36.0 - 46.0 % Final    Physical Exam:  General: alert, cooperative and appears stated age 32: appropriate Uterine Fundus: firm Incision: healing well, no significant drainage, no dehiscence DVT Evaluation: No evidence of DVT seen on physical exam. Negative Homan's sign. No cords or calf tenderness.  Discharge Diagnoses: Term Pregnancy-delivered  Discharge Information: Date: 02/12/2018 Activity: pelvic rest Diet: routine Medications: PNV, Ibuprofen and Percocet Condition: stable Instructions: refer to practice specific booklet Discharge to: home   Newborn Data: Live born female  Birth Weight: 8 lb 8 oz (3855 g) APGAR: 9, 10  Newborn Delivery   Birth date/time:  02/10/2018 07:55:00 Delivery type:  C-Section, Low Transverse Trial of labor:  No C-section categorization:  Repeat     Home with mother.  [name] 02/12/2018, 9:20 AM

## 2018-02-12 NOTE — Discharge Instructions (Signed)
Call MD for T>100.4, heavy vaginal bleeding, severe abdominal pain, intractable nausea and/or vomiting, or respiratory distress.  Call office to schedule postop incision check.  Pelvic rest x 6 weeks.  No driving while taking narcotics.

## 2018-02-12 NOTE — Progress Notes (Signed)
Subjective: Postpartum Day 2: Cesarean Delivery Patient reports tolerating PO and no problems voiding.    Objective: Vital signs in last 24 hours: Temp:  [97.6 F (36.4 C)-98 F (36.7 C)] 98 F (36.7 C) (10/25 9604) Pulse Rate:  [80-86] 80 (10/25 0613) Resp:  [16-18] 16 (10/25 0613) BP: (102-112)/(65-80) 107/65 (10/25 0613) SpO2:  [99 %] 99 % (10/24 2312)  Physical Exam:  General: alert, cooperative and appears stated age Lochia: appropriate Uterine Fundus: firm Incision: healing well, no significant drainage, no dehiscence DVT Evaluation: No evidence of DVT seen on physical exam. Negative Homan's sign. No cords or calf tenderness.  Recent Labs    02/11/18 0551 02/12/18 0523  HGB 8.2* 9.3*  HCT 23.4* 26.4*    Assessment/Plan: Status post Cesarean section. Doing well postoperatively.  Continue current care and discharge home today.  [name] 02/12/2018, 9:16 AM

## 2018-02-13 LAB — BPAM RBC
Blood Product Expiration Date: 201911182359
Blood Product Expiration Date: 201911182359
Unit Type and Rh: 5100
Unit Type and Rh: 5100

## 2018-02-13 LAB — TYPE AND SCREEN
ABO/RH(D): A POS
ANTIBODY SCREEN: NEGATIVE
PT AG TYPE: NEGATIVE
Unit division: 0
Unit division: 0

## 2018-03-09 ENCOUNTER — Ambulatory Visit (INDEPENDENT_AMBULATORY_CARE_PROVIDER_SITE_OTHER)
Admission: RE | Admit: 2018-03-09 | Discharge: 2018-03-09 | Disposition: A | Discharge status: Home / Self Care | Payer: BLUE CROSS/BLUE SHIELD | Source: Ambulatory Visit | Attending: Pulmonary Disease | Admitting: Pulmonary Disease

## 2018-03-09 DIAGNOSIS — R918 Other nonspecific abnormal finding of lung field: Secondary | ICD-10-CM

## 2018-03-09 DIAGNOSIS — R911 Solitary pulmonary nodule: Secondary | ICD-10-CM | POA: Diagnosis not present

## 2018-03-10 ENCOUNTER — Encounter: Payer: Self-pay | Admitting: Pulmonary Disease

## 2018-03-10 ENCOUNTER — Ambulatory Visit (INDEPENDENT_AMBULATORY_CARE_PROVIDER_SITE_OTHER): Payer: BLUE CROSS/BLUE SHIELD | Admitting: Pulmonary Disease

## 2018-03-10 VITALS — BP 110/74 | HR 66 | Ht 62.0 in | Wt 146.0 lb

## 2018-03-10 DIAGNOSIS — R911 Solitary pulmonary nodule: Secondary | ICD-10-CM

## 2018-03-10 NOTE — Patient Instructions (Signed)
Can follow up with pulmonary medicine as needed

## 2018-03-10 NOTE — Progress Notes (Signed)
[facility]  Chief Complaint  Patient presents with  . Follow-up    Pt had CT scan yesterday to check on lung nodules.    Constitutional:  BP 110/74 (BP Location: Left Arm, Cuff Size: Normal)   Pulse 66   Ht 5\' 2"  (1.575 m)   Wt 146 lb (66.2 kg)   LMP 05/15/2017   SpO2 98%   BMI 26.70 kg/m   Past Medical History:  Bartholin cyst, Cervical dysplasia, Depression  Brief Summary:  [name] is a 32 y.o. female former smoker with lung nodules.  She is here to f/u on CT chest.  This was done yesterday and reviewed by me.  No change in appearance of 10 mm Rt lower lobe nodule.  No other worrisome findings.  She had a baby about 4 weeks ago.  No other changes since her last visit.  Denies fever, sweats, chest pain, cough, wheeze, sputum, gland swelling, or skin rash.  Physical Exam:   Appearance - well kempt   ENMT - clear nasal mucosa, midline nasal  septum, no oral exudates, no LAN, trachea midline  Respiratory - normal chest wall, normal respiratory effort, no accessory muscle use, no wheeze/rales  CV - s1s2 regular rate and rhythm, no murmurs, no peripheral edema, radial pulses symmetric  GI - soft, non tender, no masses  Lymph - no adenopathy noted in neck and axillary areas  MSK - normal gait  Ext - no cyanosis, clubbing, or joint inflammation noted  Skin - no rashes, lesions, or ulcers  Neuro - normal strength, oriented x 3  Psych - normal mood and affect   Assessment/Plan:   Lung nodules. - dominant lung nodule in right lower lobe has been stable since 2017 - this most likely represents a benign lesion - no additional radiographic follow up needed   Patient Instructions  Can follow up with pulmonary medicine as needed   Coralyn HellingVineet Nishanth Mccaughan, MD [facility] Pager: [phone] 03/10/2018, 3:46 PM  Flow Sheet     Pulmonary tests:  CT abd/pelvis 10/24/15 >> 10 mm RLL nodule CT chest  06/20/16 >> 2 mm RML nodule, 10 mm RLL nodule CT chest 03/09/18 >> no change in RLL nodule, no new nodules  Medications:   Allergies as of 03/10/2018   No Known Allergies     Medication List    as of 03/10/2018  3:46 PM   You have not been prescribed any medications.     Past Surgical History:  She  has a past surgical history that includes Cholecystectomy (2011); Cervical biopsy w/ loop electrode excision (2012); ORIF clavicular fracture (06/10/2011); Cesarean section; Laparoscopic removal renal cyst (Left, 12/12/2015); and Cesarean section (N/A, 02/10/2018).  Family History:  Her family history includes Cancer in her paternal grandmother; Diabetes in her father; Healthy in her mother; Prostate cancer in her paternal grandfather.  Social History:  She  reports that she quit smoking about 8 years ago. She has a 0.75 pack-year smoking history. She has never used smokeless tobacco. She reports that she drinks alcohol. She reports that she does not use drugs.

## 2018-03-30 DIAGNOSIS — Z1389 Encounter for screening for other disorder: Secondary | ICD-10-CM | POA: Diagnosis not present

## 2018-03-30 DIAGNOSIS — Z124 Encounter for screening for malignant neoplasm of cervix: Secondary | ICD-10-CM | POA: Diagnosis not present

## 2018-04-15 ENCOUNTER — Ambulatory Visit: Payer: BLUE CROSS/BLUE SHIELD | Admitting: Family Medicine

## 2018-04-15 ENCOUNTER — Encounter: Payer: Self-pay | Admitting: Family Medicine

## 2018-04-15 ENCOUNTER — Other Ambulatory Visit: Payer: Self-pay

## 2018-04-15 VITALS — BP 118/62 | HR 78 | Temp 98.1°F | Resp 14 | Ht 62.0 in | Wt 146.0 lb

## 2018-04-15 DIAGNOSIS — F419 Anxiety disorder, unspecified: Secondary | ICD-10-CM | POA: Diagnosis not present

## 2018-04-15 DIAGNOSIS — F339 Major depressive disorder, recurrent, unspecified: Secondary | ICD-10-CM | POA: Diagnosis not present

## 2018-04-15 MED ORDER — CLONAZEPAM 0.5 MG PO TABS: 0.5000 mg | ORAL_TABLET | Freq: Three times a day (TID) | ORAL | 0 refills | Status: DC | PRN

## 2018-04-15 MED ORDER — VORTIOXETINE HBR 10 MG PO TABS: 10.0000 mg | ORAL_TABLET | Freq: Every day | ORAL | 2 refills | Status: DC

## 2018-04-15 NOTE — Progress Notes (Signed)
Subjective:    Patient ID: [name], female    DOB: 01/15/1986, 32 y.o.   MRN: [id number]  HPI  05/01/14 Please see my last office visit. After starting the Effexor, the patient spiraled out of control regarding her depression. She became hopeless. She tried to commit suicide by overdosing on Ambien. She was admitted the hospital in December and For several days. She was discharged home on Effexor, Ambien, Klonopin, Atarax. At the present time she is only taking Klonopin and Ambien. She is requesting a refill on both of these. She has not seen psychiatry. [facility], and was very dissatisfied with organization there and would like a referral to a different psychiatrist. Today she is complaining of depression. Zoloft did not work for the patient. She did not tolerate Effexor. She does not display any manic symptoms or symptoms of bipolar. She denies any auditory hallucinations or visual hallucinations.  At that time, my plan was: Patient denies any suicidal ideation today. Her behavior, judgment, and insight seem appropriate. I will start the patient on Brintellix 5 mg a day as an antidepressant and then increase to 10 mg a day in one week. I will see the patient back in 2 weeks to recheck her or immediately if worse. I will not refill her Ambien. I will give the patient Klonopin 0.5 mg by mouth twice a day but I will limit her to 30 pills and I will not refill those pills again until she is seen in 2 weeks. In the meantime I will try to arrange a referral to another psychiatrist..    05/26/14 Patient here for follow up.  She is doing much better on Brintellix 10 mg by mouth daily. She denies any suicidal ideation or insomnia. She is using the Klonopin less frequently. She no longer wants to see a psychiatrist. She denies any manic symptoms. Overall she is doing well. She is back in school and scheduled to graduate in May. She denies any anhedonia, poor concentration, change in appetite.  At  that time, my plan was: Continue Brintellix 10 mg by mouth daily. I will refill Klonopin 0.5 mg tablets 1 by mouth daily when necessary insomnia or anxiety. I recommended she try to wean down on this medication when she is trying to do. Follow up again in 3 months or immediately if worse  04/15/18 Patient states that she did very well while taking Brintellix.  Ultimately after she graduated, the stress in her life improved, and she was able to discontinue the medication.  She has not had any issues with depression or anxiety since.  However she recently had a baby girl.  Her daughter is approximately 972 months old.  After the birth of her daughter, the patient states that she has developed constant anxiety.  She denies having panic attacks.  Instead she feels anxious from the time she wakes up in the morning until the time that she goes to bed at night.  She is just worried that something is going to happen to the baby of that something is going to go wrong.  Is triggered worsening depression.  She realizes that some of this is an adjustment phase and that is likely exacerbating the situation however symptoms have not improved over the last 2 months.  She denies any suicidal ideation.  She denies any homicidal ideation.  She has no thoughts of wanting to hurt the baby.  She states that she loves being a mom and the  baby is the brightest part of her day.  However there is social stressors at home.  The father of the baby ended the relationship.  However he is staying in the home with the patient to help care for the baby.  This creates an awkward living arrangement that only adds to stress.  She also has to return to work in a few weeks and this has her feeling extremely anxious as well. Past Medical History:  Diagnosis Date  . Bartholin cyst    LEFT  . Cervical dysplasia   . Depression   . Vaginal Pap smear, abnormal    Past Surgical History:  Procedure Laterality Date  . CERVICAL BIOPSY  W/ LOOP  ELECTRODE EXCISION  2012  . CESAREAN SECTION     '05; '07  . CESAREAN SECTION N/A 02/10/2018   Procedure: REPEAT CESAREAN SECTION;  Surgeon: Harold Hedge, MD;  Location: [facility];  Service: Obstetrics;  Laterality: N/A;  Repeat edc 02/14/18 NKDA Heather,  RNFA  . CHOLECYSTECTOMY  2011  . LAPAROSCOPIC REMOVAL RENAL CYST Left 12/12/2015   Procedure: XI ROBOTIC LEFT RENAL CYST DECORTICATION WITH LEFT NEPHROPEXY;  Surgeon: Sebastian Ache, MD;  Location: [facility];  Service: Urology;  Laterality: Left;  . ORIF CLAVICULAR FRACTURE  06/10/2011   Procedure: OPEN REDUCTION INTERNAL FIXATION (ORIF) CLAVICULAR FRACTURE;  Surgeon: Cammy Copa, MD;  Location: [facility] OR;  Service: Orthopedics;  Laterality: Right;  ORIF Right clavicle   No current outpatient medications on file prior to visit.   No current facility-administered medications on file prior to visit.    No Known Allergies Social History   Socioeconomic History  . Marital status: Single    Spouse name: Not on file  . Number of children: 2  . Years of education: 69  . Highest education level: Not on file  Occupational History  . Occupation: Careers adviser  . Financial resource strain: Not hard at all  . Food insecurity:    Worry: Never true    Inability: Never true  . Transportation needs:    Medical: No    Non-medical: Not on file  Tobacco Use  . Smoking status: Former Smoker    Packs/day: 0.25    Years: 3.00    Pack years: 0.75    Last attempt to quit: 04/21/2009    Years since quitting: 8.9  . Smokeless tobacco: Never Used  . Tobacco comment: smoking was mostly social  Substance and Sexual Activity  . Alcohol use: Yes    Comment: 1-3 glasses of wine a week.  . Drug use: No  . Sexual activity: Yes    Birth control/protection: Pill  Lifestyle  . Physical activity:    Days per week: Not on file    Minutes per session: Not on file  . Stress: Only a little  Relationships  . Social connections:     Talks on phone: Not on file    Gets together: Not on file    Attends religious service: Not on file    Active member of club or organization: Not on file    Attends meetings of clubs or organizations: Not on file    Relationship status: Not on file  . Intimate partner violence:    Fear of current or ex partner: No    Emotionally abused: No    Physically abused: No    Forced sexual activity: No  Other Topics Concern  . Not on file  Social History Narrative  Fun: Work and hang out with children   Denies abuse and feels safe at home.      Review of Systems  All other systems reviewed and are negative.      Objective:   Physical Exam  Cardiovascular: Normal rate, regular rhythm and normal heart sounds.  Pulmonary/Chest: Effort normal and breath sounds normal. No respiratory distress. She has no wheezes. She has no rales.  Abdominal: Soft. Bowel sounds are normal.  Psychiatric: She has a normal mood and affect. Her behavior is normal. Judgment and thought content normal.  Vitals reviewed.         Assessment & Plan:  Depression, recurrent, postpartum in a patient with a history of a suicide attempt  Generalized anxiety  Patient is being very proactive in trying to get ahead of this prior to the situation worsening.  She would like to resume her previous medication.  Therefore I will start her on Trintellix 5 mg daily and increase to 10 mg in 2 weeks.  She can use Klonopin 0.5 mg p.o. every 8 hours as needed anxiety.  Reassess in 1 month or sooner if worsening.

## 2018-05-13 ENCOUNTER — Encounter: Payer: Self-pay | Admitting: Family Medicine

## 2018-05-13 ENCOUNTER — Ambulatory Visit: Payer: BLUE CROSS/BLUE SHIELD | Admitting: Family Medicine

## 2018-05-13 VITALS — BP 110/64 | HR 96 | Temp 98.4°F | Resp 14 | Ht 62.0 in | Wt 141.0 lb

## 2018-05-13 DIAGNOSIS — F419 Anxiety disorder, unspecified: Secondary | ICD-10-CM | POA: Diagnosis not present

## 2018-05-13 DIAGNOSIS — F339 Major depressive disorder, recurrent, unspecified: Secondary | ICD-10-CM | POA: Diagnosis not present

## 2018-05-13 DIAGNOSIS — F988 Other specified behavioral and emotional disorders with onset usually occurring in childhood and adolescence: Secondary | ICD-10-CM

## 2018-05-13 MED ORDER — LISDEXAMFETAMINE DIMESYLATE 30 MG PO CAPS: 30.0000 mg | ORAL_CAPSULE | Freq: Every day | ORAL | 0 refills | Status: DC

## 2018-05-13 NOTE — Progress Notes (Signed)
Subjective:    Patient ID: [name], female    DOB: [birth year], 33 y.o.   MRN: [id number]  HPI  05/01/14 Please see my last office visit. After starting the Effexor, the patient spiraled out of control regarding her depression. She became hopeless. She tried to commit suicide by overdosing on Ambien. She was admitted the hospital in December and For several days. She was discharged home on Effexor, Ambien, Klonopin, Atarax. At the present time she is only taking Klonopin and Ambien. She is requesting a refill on both of these. She has not seen psychiatry. She went to [facility], and was very dissatisfied with organization there and would like a referral to a different psychiatrist. Today she is complaining of depression. Zoloft did not work for the patient. She did not tolerate Effexor. She does not display any manic symptoms or symptoms of bipolar. She denies any auditory hallucinations or visual hallucinations.  At that time, my plan was: Patient denies any suicidal ideation today. Her behavior, judgment, and insight seem appropriate. I will start the patient on Brintellix 5 mg a day as an antidepressant and then increase to 10 mg a day in one week. I will see the patient back in 2 weeks to recheck her or immediately if worse. I will not refill her Ambien. I will give the patient Klonopin 0.5 mg by mouth twice a day but I will limit her to 30 pills and I will not refill those pills again until she is seen in 2 weeks. In the meantime I will try to arrange a referral to another psychiatrist..    05/26/14 Patient here for follow up.  She is doing much better on Brintellix 10 mg by mouth daily. She denies any suicidal ideation or insomnia. She is using the Klonopin less frequently. She no longer wants to see a psychiatrist. She denies any manic symptoms. Overall she is doing well. She is back in school and scheduled to graduate in May. She denies any anhedonia, poor concentration, change in appetite.  At  that time, my plan was: Continue Brintellix 10 mg by mouth daily. I will refill Klonopin 0.5 mg tablets 1 by mouth daily when necessary insomnia or anxiety. I recommended she try to wean down on this medication when she is trying to do. Follow up again in 3 months or immediately if worse  04/15/18 Patient states that she did very well while taking Brintellix.  Ultimately after she graduated, the stress in her life improved, and she was able to discontinue the medication.  She has not had any issues with depression or anxiety since.  However she recently had a baby girl.  Her daughter is approximately 49 months old.  After the birth of her daughter, the patient states that she has developed constant anxiety.  She denies having panic attacks.  Instead she feels anxious from the time she wakes up in the morning until the time that she goes to bed at night.  She is just worried that something is going to happen to the baby of that something is going to go wrong.  Is triggered worsening depression.  She realizes that some of this is an adjustment phase and that is likely exacerbating the situation however symptoms have not improved over the last 2 months.  She denies any suicidal ideation.  She denies any homicidal ideation.  She has no thoughts of wanting to hurt the baby.  She states that she loves being a mom and the  baby is the brightest part of her day.  However there is social stressors at home.  The father of the baby ended the relationship.  However he is staying in the home with the patient to help care for the baby.  This creates an awkward living arrangement that only adds to stress.  She also has to return to work in a few weeks and this has her feeling extremely anxious as well.  At that time, my plan was: Patient is being very proactive in trying to get ahead of this prior to the situation worsening.  She would like to resume her previous medication.  Therefore I will start her on Trintellix 5 mg daily  and increase to 10 mg in 2 weeks.  She can use Klonopin 0.5 mg p.o. every 8 hours as needed anxiety.  Reassess in 1 month or sooner if worsening.  05/13/18 Patient had to take Trintellix 5 mg a day for 3 weeks to allow her body to adjust to the medication due to nausea.  After 3 weeks she was gradually able to increase to 10 mg a day of the Trintellix.  Therefore she is only been on the 10 mg dose for approximately a week.  She states that she can definitely tell an improvement in the depression.  She denies any suicidal thoughts or or thoughts of wanting to hurt the baby.  She is still suffering with anxiety although this is gradually getting better.  She seen substantial benefit from the Klonopin.  She is using that every 3 or 4 days.  She is not taking it with any regularity.  Her biggest concern for anxiety as she is supposed to return to work on Monday.  She is very hesitant and scared to leave the baby.  She would like to resume her Vyvanse that she was previously taking for ADD.  In the past she was on 30 mg a day of Vyvanse.  This medication allowed her to maintain her focus and concentration at work.  Without the medication she finds herself easily distracted and has a difficult time completing her assigned functions.  The patient works as a Clinical research associate.  She denied any previous history of anxiety when she took the medication or insomnia when she took the medication. Past Medical History:  Diagnosis Date  . Bartholin cyst    LEFT  . Cervical dysplasia   . Depression   . Vaginal Pap smear, abnormal    Past Surgical History:  Procedure Laterality Date  . CERVICAL BIOPSY  W/ LOOP ELECTRODE EXCISION  2012  . CESAREAN SECTION     '05; '07  . CESAREAN SECTION N/A 02/10/2018   Procedure: REPEAT CESAREAN SECTION;  Surgeon: Harold Hedge, MD;  Location: [facility];  Service: Obstetrics;  Laterality: N/A;  Repeat edc 02/14/18 NKDA Heather,  RNFA  . CHOLECYSTECTOMY  2011  . LAPAROSCOPIC  REMOVAL RENAL CYST Left 12/12/2015   Procedure: XI ROBOTIC LEFT RENAL CYST DECORTICATION WITH LEFT NEPHROPEXY;  Surgeon: Sebastian Ache, MD;  Location: [facility];  Service: Urology;  Laterality: Left;  . ORIF CLAVICULAR FRACTURE  06/10/2011   Procedure: OPEN REDUCTION INTERNAL FIXATION (ORIF) CLAVICULAR FRACTURE;  Surgeon: Cammy Copa, MD;  Location: [facility] OR;  Service: Orthopedics;  Laterality: Right;  ORIF Right clavicle   Current Outpatient Medications on File Prior to Visit  Medication Sig Dispense Refill  . clonazePAM (KLONOPIN) 0.5 MG tablet Take 1 tablet (0.5 mg total) by mouth 3 (three) times daily as  needed for anxiety. 60 tablet 0  . SPRINTEC 28 0.25-35 MG-MCG tablet Take 1 tablet by mouth daily.    Marland Kitchen. vortioxetine HBr (TRINTELLIX) 10 MG TABS tablet Take 1 tablet (10 mg total) by mouth daily. 30 tablet 2   No current facility-administered medications on file prior to visit.    No Known Allergies Social History   Socioeconomic History  . Marital status: Single    Spouse name: Not on file  . Number of children: 2  . Years of education: 1918  . Highest education level: Not on file  Occupational History  . Occupation: Careers adviserLawyer  Social Needs  . Financial resource strain: Not hard at all  . Food insecurity:    Worry: Never true    Inability: Never true  . Transportation needs:    Medical: No    Non-medical: Not on file  Tobacco Use  . Smoking status: Former Smoker    Packs/day: 0.25    Years: 3.00    Pack years: 0.75    Last attempt to quit: 04/21/2009    Years since quitting: 9.0  . Smokeless tobacco: Never Used  . Tobacco comment: smoking was mostly social  Substance and Sexual Activity  . Alcohol use: Yes    Comment: 1-3 glasses of wine a week.  . Drug use: No  . Sexual activity: Yes    Birth control/protection: Pill  Lifestyle  . Physical activity:    Days per week: Not on file    Minutes per session: Not on file  . Stress: Only a little  Relationships  . Social  connections:    Talks on phone: Not on file    Gets together: Not on file    Attends religious service: Not on file    Active member of club or organization: Not on file    Attends meetings of clubs or organizations: Not on file    Relationship status: Not on file  . Intimate partner violence:    Fear of current or ex partner: No    Emotionally abused: No    Physically abused: No    Forced sexual activity: No  Other Topics Concern  . Not on file  Social History Narrative   Fun: Work and hang out with children   Denies abuse and feels safe at home.      Review of Systems  All other systems reviewed and are negative.      Objective:   Physical Exam  Cardiovascular: Normal rate, regular rhythm and normal heart sounds.  Pulmonary/Chest: Effort normal and breath sounds normal. No respiratory distress. She has no wheezes. She has no rales.  Abdominal: Soft. Bowel sounds are normal.  Psychiatric: She has a normal mood and affect. Her behavior is normal. Judgment and thought content normal.  Vitals reviewed.         Assessment & Plan:  Generalized anxiety disorder, ADD, depression  Depression is much better.  Continue Trintellix 10 mg a day.  Anxiety is improving.  The patient is gradually improving from this.  She is using Klonopin sparingly.  Reassess in 3 weeks after the Trintellix has had more time to take effect at a higher dose.  If persistent, consider adding BuSpar.  Resume Vyvanse 30 mg a day and reassess via telephone in 3 to 4 weeks.  Check in sooner if symptoms worsen

## 2018-06-18 ENCOUNTER — Other Ambulatory Visit: Payer: Self-pay | Admitting: Family Medicine

## 2018-06-18 MED ORDER — LISDEXAMFETAMINE DIMESYLATE 30 MG PO CAPS: 30.0000 mg | ORAL_CAPSULE | Freq: Every day | ORAL | 0 refills | Status: DC

## 2018-06-18 NOTE — Telephone Encounter (Signed)
Requesting refill    Vyvanse  LOV: 05/13/18  LRF:  05/13/18

## 2018-07-05 ENCOUNTER — Other Ambulatory Visit: Payer: Self-pay | Admitting: Family Medicine

## 2018-07-05 MED ORDER — CLONAZEPAM 0.5 MG PO TABS: 0.5000 mg | ORAL_TABLET | Freq: Three times a day (TID) | ORAL | 0 refills | Status: DC | PRN

## 2018-07-05 NOTE — Telephone Encounter (Signed)
Ok to refill??  Last office visit 05/13/2018  Last refill 04/18/2018.

## 2018-08-02 ENCOUNTER — Other Ambulatory Visit: Payer: Self-pay | Admitting: Family Medicine

## 2018-08-02 MED ORDER — LISDEXAMFETAMINE DIMESYLATE 30 MG PO CAPS: 30.0000 mg | ORAL_CAPSULE | Freq: Every day | ORAL | 0 refills | Status: DC

## 2018-08-02 NOTE — Telephone Encounter (Signed)
Requesting refill    Vyvanse  LOV: 05/13/18  LRF: 06/18/18

## 2018-09-23 ENCOUNTER — Other Ambulatory Visit: Payer: Self-pay | Admitting: Family Medicine

## 2018-09-23 MED ORDER — CLONAZEPAM 0.5 MG PO TABS: 0.5000 mg | ORAL_TABLET | Freq: Three times a day (TID) | ORAL | 0 refills | Status: DC | PRN

## 2018-09-23 MED ORDER — LISDEXAMFETAMINE DIMESYLATE 30 MG PO CAPS: 30.0000 mg | ORAL_CAPSULE | Freq: Every day | ORAL | 0 refills | Status: DC

## 2018-09-23 NOTE — Telephone Encounter (Signed)
Requested Prescriptions   Pending Prescriptions Disp Refills  . clonazePAM (KLONOPIN) 0.5 MG tablet 60 tablet 0    Sig: Take 1 tablet (0.5 mg total) by mouth 3 (three) times daily as needed for anxiety.  Marland Kitchen lisdexamfetamine (VYVANSE) 30 MG capsule 30 capsule 0    Sig: Take 1 capsule (30 mg total) by mouth daily.   Last OV 05/13/2018 Last written   Clonazepam 07/05/2018 vyvanse 08/02/2018

## 2018-10-04 DIAGNOSIS — T8140XA Infection following a procedure, unspecified, initial encounter: Secondary | ICD-10-CM | POA: Diagnosis not present

## 2018-11-02 ENCOUNTER — Other Ambulatory Visit: Payer: Self-pay | Admitting: Family Medicine

## 2018-11-02 MED ORDER — LISDEXAMFETAMINE DIMESYLATE 30 MG PO CAPS: 30.0000 mg | ORAL_CAPSULE | Freq: Every day | ORAL | 0 refills | Status: DC

## 2018-11-02 NOTE — Telephone Encounter (Signed)
Ok to refill??  Last office visit 05/13/2018.  Last refill 09/23/2018.

## 2018-11-23 DIAGNOSIS — Z113 Encounter for screening for infections with a predominantly sexual mode of transmission: Secondary | ICD-10-CM | POA: Diagnosis not present

## 2018-11-23 DIAGNOSIS — N760 Acute vaginitis: Secondary | ICD-10-CM | POA: Diagnosis not present

## 2018-12-06 ENCOUNTER — Other Ambulatory Visit: Payer: Self-pay | Admitting: Family Medicine

## 2018-12-06 MED ORDER — CLONAZEPAM 0.5 MG PO TABS: 0.5000 mg | ORAL_TABLET | Freq: Three times a day (TID) | ORAL | 0 refills | Status: DC | PRN

## 2018-12-06 NOTE — Telephone Encounter (Signed)
Ok to refill??  Last office visit 05/13/2018.  Last refill 09/23/2018. 

## 2018-12-17 DIAGNOSIS — H52223 Regular astigmatism, bilateral: Secondary | ICD-10-CM | POA: Diagnosis not present

## 2018-12-17 DIAGNOSIS — H5213 Myopia, bilateral: Secondary | ICD-10-CM | POA: Diagnosis not present

## 2018-12-28 ENCOUNTER — Other Ambulatory Visit: Payer: Self-pay | Admitting: Family Medicine

## 2018-12-28 MED ORDER — LISDEXAMFETAMINE DIMESYLATE 30 MG PO CAPS: 30.0000 mg | ORAL_CAPSULE | Freq: Every day | ORAL | 0 refills | Status: DC

## 2018-12-28 NOTE — Telephone Encounter (Signed)
Last refilled: 11/02/2018 Last office visit: 05/13/2018

## 2018-12-30 DIAGNOSIS — Z20828 Contact with and (suspected) exposure to other viral communicable diseases: Secondary | ICD-10-CM | POA: Diagnosis not present

## 2018-12-31 ENCOUNTER — Ambulatory Visit: Payer: BLUE CROSS/BLUE SHIELD | Admitting: Family Medicine

## 2019-01-12 DIAGNOSIS — F331 Major depressive disorder, recurrent, moderate: Secondary | ICD-10-CM | POA: Diagnosis not present

## 2019-01-27 ENCOUNTER — Other Ambulatory Visit: Payer: Self-pay | Admitting: Family Medicine

## 2019-01-28 MED ORDER — LISDEXAMFETAMINE DIMESYLATE 30 MG PO CAPS: 30.0000 mg | ORAL_CAPSULE | Freq: Every day | ORAL | 0 refills | Status: DC

## 2019-01-28 MED ORDER — CLONAZEPAM 0.5 MG PO TABS: 0.5000 mg | ORAL_TABLET | Freq: Three times a day (TID) | ORAL | 0 refills | Status: DC | PRN

## 2019-01-28 NOTE — Telephone Encounter (Signed)
Ok to refill??  Last office visit 05/13/2018.  Last refill on Klonopin 12/06/2018  Last refill on Vyvanse 12/28/2018.

## 2019-02-17 DIAGNOSIS — Z23 Encounter for immunization: Secondary | ICD-10-CM | POA: Diagnosis not present

## 2019-02-23 DIAGNOSIS — F331 Major depressive disorder, recurrent, moderate: Secondary | ICD-10-CM | POA: Diagnosis not present

## 2019-03-05 DIAGNOSIS — B349 Viral infection, unspecified: Secondary | ICD-10-CM | POA: Diagnosis not present

## 2019-03-05 DIAGNOSIS — J029 Acute pharyngitis, unspecified: Secondary | ICD-10-CM | POA: Diagnosis not present

## 2019-03-05 DIAGNOSIS — G4489 Other headache syndrome: Secondary | ICD-10-CM | POA: Diagnosis not present

## 2019-03-06 ENCOUNTER — Other Ambulatory Visit: Payer: Self-pay | Admitting: Family Medicine

## 2019-03-06 DIAGNOSIS — Z20828 Contact with and (suspected) exposure to other viral communicable diseases: Secondary | ICD-10-CM | POA: Diagnosis not present

## 2019-03-07 MED ORDER — LISDEXAMFETAMINE DIMESYLATE 30 MG PO CAPS: 30.0000 mg | ORAL_CAPSULE | Freq: Every day | ORAL | 0 refills | Status: DC

## 2019-03-07 MED ORDER — CLONAZEPAM 0.5 MG PO TABS: 0.5000 mg | ORAL_TABLET | Freq: Three times a day (TID) | ORAL | 0 refills | Status: DC | PRN

## 2019-03-07 NOTE — Telephone Encounter (Signed)
Ok to refill??  Last office visit 05/13/2018.  Last refill 01/28/2019 on both.

## 2019-04-13 ENCOUNTER — Other Ambulatory Visit: Payer: Self-pay | Admitting: Family Medicine

## 2019-04-18 MED ORDER — CLONAZEPAM 0.5 MG PO TABS: 0.5000 mg | ORAL_TABLET | Freq: Three times a day (TID) | ORAL | 0 refills | Status: DC | PRN

## 2019-04-18 NOTE — Telephone Encounter (Signed)
Ok to refill??  Last office visit 05/13/2018.  Last refill 03/07/2019.

## 2019-05-07 ENCOUNTER — Other Ambulatory Visit: Payer: Self-pay | Admitting: Family Medicine

## 2019-05-09 MED ORDER — LISDEXAMFETAMINE DIMESYLATE 30 MG PO CAPS: 30.0000 mg | ORAL_CAPSULE | Freq: Every day | ORAL | 0 refills | Status: DC

## 2019-05-09 NOTE — Telephone Encounter (Signed)
Ok to refill??  Last office visit 05/13/2018.  Last refill 03/07/2019.

## 2019-06-02 ENCOUNTER — Other Ambulatory Visit: Payer: Self-pay | Admitting: Family Medicine

## 2019-06-03 MED ORDER — CLONAZEPAM 0.5 MG PO TABS: 0.5000 mg | ORAL_TABLET | Freq: Three times a day (TID) | ORAL | 0 refills | Status: DC | PRN

## 2019-06-03 NOTE — Telephone Encounter (Signed)
Ok to refill??  Last office visit 05/13/2018.  Last refill 04/18/2019.

## 2019-06-21 ENCOUNTER — Other Ambulatory Visit: Payer: Self-pay

## 2019-06-21 ENCOUNTER — Ambulatory Visit (INDEPENDENT_AMBULATORY_CARE_PROVIDER_SITE_OTHER): Payer: BC Managed Care – PPO | Admitting: Family Medicine

## 2019-06-21 ENCOUNTER — Encounter: Payer: Self-pay | Admitting: Family Medicine

## 2019-06-21 VITALS — BP 146/98 | HR 84 | Temp 96.6°F | Resp 14 | Ht 62.0 in | Wt 151.0 lb

## 2019-06-21 DIAGNOSIS — F988 Other specified behavioral and emotional disorders with onset usually occurring in childhood and adolescence: Secondary | ICD-10-CM | POA: Diagnosis not present

## 2019-06-21 DIAGNOSIS — R030 Elevated blood-pressure reading, without diagnosis of hypertension: Secondary | ICD-10-CM | POA: Diagnosis not present

## 2019-06-21 DIAGNOSIS — F411 Generalized anxiety disorder: Secondary | ICD-10-CM

## 2019-06-21 MED ORDER — SERTRALINE HCL 50 MG PO TABS: 50.0000 mg | ORAL_TABLET | Freq: Every day | ORAL | 3 refills | Status: DC

## 2019-06-21 NOTE — Progress Notes (Signed)
Subjective:    Patient ID: [name], female    DOB: [birth year], 34 y.o.   MRN: [id number]  HPI  05/01/14 Please see my last office visit. After starting the Effexor, the patient spiraled out of control regarding her depression. She became hopeless. She tried to commit suicide by overdosing on Ambien. She was admitted the hospital in December and For several days. She was discharged home on Effexor, Ambien, Klonopin, Atarax. At the present time she is only taking Klonopin and Ambien. She is requesting a refill on both of these. She has not seen psychiatry. She went to [facility], and was very dissatisfied with organization there and would like a referral to a different psychiatrist. Today she is complaining of depression. Zoloft did not work for the patient. She did not tolerate Effexor. She does not display any manic symptoms or symptoms of bipolar. She denies any auditory hallucinations or visual hallucinations.  At that time, my plan was: Patient denies any suicidal ideation today. Her behavior, judgment, and insight seem appropriate. I will start the patient on Brintellix 5 mg a day as an antidepressant and then increase to 10 mg a day in one week. I will see the patient back in 2 weeks to recheck her or immediately if worse. I will not refill her Ambien. I will give the patient Klonopin 0.5 mg by mouth twice a day but I will limit her to 30 pills and I will not refill those pills again until she is seen in 2 weeks. In the meantime I will try to arrange a referral to another psychiatrist..    05/26/14 Patient here for follow up.  She is doing much better on Brintellix 10 mg by mouth daily. She denies any suicidal ideation or insomnia. She is using the Klonopin less frequently. She no longer wants to see a psychiatrist. She denies any manic symptoms. Overall she is doing well. She is back in school and scheduled to graduate in May. She denies any anhedonia, poor concentration, change in appetite.  At  that time, my plan was: Continue Brintellix 10 mg by mouth daily. I will refill Klonopin 0.5 mg tablets 1 by mouth daily when necessary insomnia or anxiety. I recommended she try to wean down on this medication when she is trying to do. Follow up again in 3 months or immediately if worse  04/15/18 Patient states that she did very well while taking Brintellix.  Ultimately after she graduated, the stress in her life improved, and she was able to discontinue the medication.  She has not had any issues with depression or anxiety since.  However she recently had a baby girl.  Her daughter is approximately 54 months old.  After the birth of her daughter, the patient states that she has developed constant anxiety.  She denies having panic attacks.  Instead she feels anxious from the time she wakes up in the morning until the time that she goes to bed at night.  She is just worried that something is going to happen to the baby of that something is going to go wrong.  Is triggered worsening depression.  She realizes that some of this is an adjustment phase and that is likely exacerbating the situation however symptoms have not improved over the last 2 months.  She denies any suicidal ideation.  She denies any homicidal ideation.  She has no thoughts of wanting to hurt the baby.  She states that she loves being a mom and the  baby is the brightest part of her day.  However there is social stressors at home.  The father of the baby ended the relationship.  However he is staying in the home with the patient to help care for the baby.  This creates an awkward living arrangement that only adds to stress.  She also has to return to work in a few weeks and this has her feeling extremely anxious as well.  At that time, my plan was: Patient is being very proactive in trying to get ahead of this prior to the situation worsening.  She would like to resume her previous medication.  Therefore I will start her on Trintellix 5 mg daily  and increase to 10 mg in 2 weeks.  She can use Klonopin 0.5 mg p.o. every 8 hours as needed anxiety.  Reassess in 1 month or sooner if worsening.  05/13/18 Patient had to take Trintellix 5 mg a day for 3 weeks to allow her body to adjust to the medication due to nausea.  After 3 weeks she was gradually able to increase to 10 mg a day of the Trintellix.  Therefore she is only been on the 10 mg dose for approximately a week.  She states that she can definitely tell an improvement in the depression.  She denies any suicidal thoughts or or thoughts of wanting to hurt the baby.  She is still suffering with anxiety although this is gradually getting better.  She seen substantial benefit from the Klonopin.  She is using that every 3 or 4 days.  She is not taking it with any regularity.  Her biggest concern for anxiety as she is supposed to return to work on Monday.  She is very hesitant and scared to leave the baby.  She would like to resume her Vyvanse that she was previously taking for ADD.  In the past she was on 30 mg a day of Vyvanse.  This medication allowed her to maintain her focus and concentration at work.  Without the medication she finds herself easily distracted and has a difficult time completing her assigned functions.  The patient works as a Clinical research associate.  She denied any previous history of anxiety when she took the medication or insomnia when she took the medication.  At that time, my plan was: Depression is much better.  Continue Trintellix 10 mg a day.  Anxiety is improving.  The patient is gradually improving from this.  She is using Klonopin sparingly.  Reassess in 3 weeks after the Trintellix has had more time to take effect at a higher dose.  If persistent, consider adding BuSpar.  Resume Vyvanse 30 mg a day and reassess via telephone in 3 to 4 weeks.  Check in sooner if symptoms worsen  06/21/19 Patient is here today at my request because we would not refill her medication before she has an office  visit.  She has not been seen in more than a year.  Patient stopped Trintellix shortly after when I saw her last time due to nausea and upset stomach.  At the present time she is taking Klonopin twice a day every day to help manage her anxiety.  She is not taking any other medication for that.  She also takes Vyvanse 2-3 times a week.  She only takes the Vyvanse when she goes into the office to help her focus.  When she is working at home she does not require the Vyvanse.  However she is under tremendous  stress.  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  She denies any depression.  She denies any suicidal ideation however her blood pressure is extremely high today for her age.  She is not taking any type of birth control that would explain the elevated blood pressure.  She has not taken the Vyvanse today.  She denies taking any stimulants.  She does feel that she is under a lot of stress.  She denies any chest pain or shortness of breath or dyspnea on exertion. Past Medical History:  Diagnosis Date  . Bartholin cyst    LEFT  . Cervical dysplasia   . Depression   . Vaginal Pap smear, abnormal    Past Surgical History:  Procedure Laterality Date  . CERVICAL BIOPSY  W/ LOOP ELECTRODE EXCISION  2012  . CESAREAN SECTION     '05; '07  . CESAREAN SECTION N/A 02/10/2018   Procedure: REPEAT CESAREAN SECTION;  Surgeon: Harold Hedge, MD;  Location: [facility]IRTHING SUIT[facility];  Service: Obstetrics;  Laterality: N/A;  Repeat edc 02/14/18 NKDA Heather,  RNFA  . CHOLECYSTECTOMY  2011  . LAPAROSCOPIC REMOVAL RENAL CYST Left 12/12/2015   Procedure: XI ROBOTIC LEFT RENAL CYST DECORTICATION WITH LEFT NEPHROPEXY;  Surgeon: Sebastian Ache, MD;  Location: [facility] ORS;  Service: Urology;  Laterality: Left;  . ORIF CLAVICULAR FRACTURE  06/10/2011   Procedure: OPEN REDUCTION INTERNAL FIXATION (ORIF) CLAVICULAR FRACTURE;  Surgeon: Cammy Copa, MD;  Location: [facility] OR;  Service: Orthopedics;   Laterality: Right;  ORIF Right clavicle   Current Outpatient Medications on File Prior to Visit  Medication Sig Dispense Refill  . clonazePAM (KLONOPIN) 0.5 MG tablet Take 1 tablet (0.5 mg total) by mouth 3 (three) times daily as needed for anxiety. 60 tablet 0  . lisdexamfetamine (VYVANSE) 30 MG capsule Take 1 capsule (30 mg total) by mouth daily. 30 capsule 0  . SPRINTEC 28 0.25-35 MG-MCG tablet Take 1 tablet by mouth daily.     No current facility-administered medications on file prior to visit.   No Known Allergies Social History   Socioeconomic History  . Marital status: Single    Spouse name: Not on file  . Number of children: 2  . Years of education: 28  . Highest education level: Not on file  Occupational History  . Occupation: Clinical research associate  Tobacco Use  . Smoking status: Former Smoker    Packs/day: 0.25    Years: 3.00    Pack years: 0.75    Quit date: 04/21/2009    Years since quitting: 10.1  . Smokeless tobacco: Never Used  . Tobacco comment: smoking was mostly social  Substance and Sexual Activity  . Alcohol use: Yes    Comment: 1-3 glasses of wine a week.  . Drug use: No  . Sexual activity: Yes    Birth control/protection: Pill  Other Topics Concern  . Not on file  Social History Narrative   Fun: Work and hang out with children   Denies abuse and feels safe at home.    Social Determinants of Health   Financial Resource Strain:   . Difficulty of Paying Living Expenses: Not on file  Food Insecurity:   . Worried About Programme researcher, broadcasting/film/video in the Last Year: Not on file  . Ran Out of Food in the Last Year: Not on file  Transportation Needs:   . Lack of Transportation (Medical): Not on file  . Lack of Transportation (Non-Medical):  Not on file  Physical Activity:   . Days of Exercise per Week: Not on file  . Minutes of Exercise per Session: Not on file  Stress:   . Feeling of Stress : Not on file  Social Connections:   . Frequency of Communication with Friends  and Family: Not on file  . Frequency of Social Gatherings with Friends and Family: Not on file  . Attends Religious Services: Not on file  . Active Member of Clubs or Organizations: Not on file  . Attends Banker Meetings: Not on file  . Marital Status: Not on file  Intimate Partner Violence:   . Fear of Current or Ex-Partner: Not on file  . Emotionally Abused: Not on file  . Physically Abused: Not on file  . Sexually Abused: Not on file     Review of Systems  All other systems reviewed and are negative.      Objective:   Physical Exam  Cardiovascular: Normal rate, regular rhythm and normal heart sounds.  Pulmonary/Chest: Effort normal and breath sounds normal. No respiratory distress. She has no wheezes. She has no rales.  Abdominal: Soft. Bowel sounds are normal.  Psychiatric: She has a normal mood and affect. Her behavior is normal. Judgment and thought content normal.  Vitals reviewed.         Assessment & Plan:  GAD (generalized anxiety disorder) - Plan: sertraline (ZOLOFT) 50 MG tablet, CBC with Differential/Platelet, BASIC METABOLIC PANEL WITH GFR  ADD (attention deficit disorder) without hyperactivity  Elevated blood pressure reading  I had a long discussion today with the patient regarding the situation.  I do not feel that taking Klonopin twice a day every day for however is the best way to manage her anxiety.  I believe that in time she will become dependent on the medication and I will lose its effectiveness.  I also believe that her stress and anxiety is likely contributing to her elevated blood pressure.  Therefore I recommended that we better try to manage her anxiety.  I will start her on Zoloft 50 mg p.o. nightly in addition to the Klonopin.  I would asked to recheck the patient via email in 3 to 4 weeks.  If she is doing better, we will continue that.  If not we may supplement Wellbutrin with Zoloft as the patient has previously in the past tried  Zoloft, Lexapro, Effexor, and Trintellix.  I believe eventually she will need a combination of an SSRI and Wellbutrin to manage her anxiety without giving her side effects in addition to the Klonopin.  We will continue the Vyvanse which the patient is using sparingly for focus.  This seems to help her and not exacerbate her anxiety.  If anything it may actually help her anxiety by helping her focus at work and being more productive removing some of the stress off her plate.  Of asked the patient to check her blood pressure every day at home and email me the values in 1 to 2 weeks.  I believe this is likely due to anxiety however if persistently elevated I would recommend a renal ultrasound.  Meanwhile check a CBC and a CMP.

## 2019-06-22 LAB — CBC WITH DIFFERENTIAL/PLATELET
Absolute Monocytes: 549 cells/uL (ref 200–950)
Basophils Absolute: 39 cells/uL (ref 0–200)
Basophils Relative: 0.4 %
Eosinophils Absolute: 78 cells/uL (ref 15–500)
Eosinophils Relative: 0.8 %
HCT: 38.8 % (ref 35.0–45.0)
Hemoglobin: 13.1 g/dL (ref 11.7–15.5)
Lymphs Abs: 2901 cells/uL (ref 850–3900)
MCH: 29.8 pg (ref 27.0–33.0)
MCHC: 33.8 g/dL (ref 32.0–36.0)
MCV: 88.2 fL (ref 80.0–100.0)
MPV: 11.4 fL (ref 7.5–12.5)
Monocytes Relative: 5.6 %
Neutro Abs: 6233 cells/uL (ref 1500–7800)
Neutrophils Relative %: 63.6 %
Platelets: 298 10*3/uL (ref 140–400)
RBC: 4.4 10*6/uL (ref 3.80–5.10)
RDW: 12.3 % (ref 11.0–15.0)
Total Lymphocyte: 29.6 %
WBC: 9.8 10*3/uL (ref 3.8–10.8)

## 2019-06-22 LAB — BASIC METABOLIC PANEL WITH GFR
BUN: 10 mg/dL (ref 7–25)
CO2: 23 mmol/L (ref 20–32)
Calcium: 9.3 mg/dL (ref 8.6–10.2)
Chloride: 105 mmol/L (ref 98–110)
Creat: 0.59 mg/dL (ref 0.50–1.10)
GFR, Est African American: 140 mL/min/{1.73_m2} (ref >=60)
GFR, Est Non African American: 120 mL/min/{1.73_m2} (ref >=60)
Glucose, Bld: 94 mg/dL (ref 65–99)
Potassium: 3.6 mmol/L (ref 3.5–5.3)
Sodium: 140 mmol/L (ref 135–146)

## 2019-06-24 ENCOUNTER — Other Ambulatory Visit: Payer: Self-pay | Admitting: Family Medicine

## 2019-06-24 NOTE — Telephone Encounter (Signed)
Ok to refill??  Last office visit 06/21/2019.  Last refill 05/09/2019 on Vyvanse.   Last refill 06/03/2019 on Klonopin.

## 2019-06-27 MED ORDER — CLONAZEPAM 0.5 MG PO TABS: 0.5000 mg | ORAL_TABLET | Freq: Three times a day (TID) | ORAL | 0 refills | Status: DC | PRN

## 2019-06-27 MED ORDER — LISDEXAMFETAMINE DIMESYLATE 30 MG PO CAPS: 30.0000 mg | ORAL_CAPSULE | Freq: Every day | ORAL | 0 refills | Status: DC

## 2019-06-28 ENCOUNTER — Encounter: Payer: Self-pay | Admitting: Family Medicine

## 2019-07-17 ENCOUNTER — Other Ambulatory Visit: Payer: Self-pay | Admitting: Family Medicine

## 2019-07-17 DIAGNOSIS — F411 Generalized anxiety disorder: Secondary | ICD-10-CM

## 2019-08-06 ENCOUNTER — Other Ambulatory Visit: Payer: Self-pay | Admitting: Family Medicine

## 2019-08-08 MED ORDER — CLONAZEPAM 0.5 MG PO TABS: 0.5000 mg | ORAL_TABLET | Freq: Three times a day (TID) | ORAL | 0 refills | Status: DC | PRN

## 2019-08-08 MED ORDER — LISDEXAMFETAMINE DIMESYLATE 30 MG PO CAPS: 30.0000 mg | ORAL_CAPSULE | Freq: Every day | ORAL | 0 refills | Status: DC

## 2019-08-08 NOTE — Telephone Encounter (Signed)
Requested Prescriptions   Pending Prescriptions Disp Refills  . lisdexamfetamine (VYVANSE) 30 MG capsule 30 capsule 0    Sig: Take 1 capsule (30 mg total) by mouth daily.  . clonazePAM (KLONOPIN) 0.5 MG tablet 60 tablet 0    Sig: Take 1 tablet (0.5 mg total) by mouth 3 (three) times daily as needed for anxiety.    Last OV 06/21/2019   Last written 06/27/2019

## 2019-09-07 ENCOUNTER — Other Ambulatory Visit: Payer: Self-pay | Admitting: Family Medicine

## 2019-09-07 NOTE — Telephone Encounter (Signed)
Requesting refill    Klonopin & Vyvanse  LOV: 06/21/2019  LRF:  08/08/2019

## 2019-09-08 MED ORDER — LISDEXAMFETAMINE DIMESYLATE 30 MG PO CAPS: 30.0000 mg | ORAL_CAPSULE | Freq: Every day | ORAL | 0 refills | Status: DC

## 2019-09-08 MED ORDER — CLONAZEPAM 0.5 MG PO TABS: 0.5000 mg | ORAL_TABLET | Freq: Three times a day (TID) | ORAL | 0 refills | Status: DC | PRN

## 2019-10-12 ENCOUNTER — Other Ambulatory Visit: Payer: Self-pay | Admitting: Family Medicine

## 2019-10-12 NOTE — Telephone Encounter (Signed)
Requested Prescriptions   Pending Prescriptions Disp Refills  . lisdexamfetamine (VYVANSE) 30 MG capsule 30 capsule 0    Sig: Take 1 capsule (30 mg total) by mouth daily.  . clonazePAM (KLONOPIN) 0.5 MG tablet 60 tablet 0    Sig: Take 1 tablet (0.5 mg total) by mouth 3 (three) times daily as needed for anxiety.    Last OV 06/21/2019   Last ordered 09/08/2019

## 2019-10-13 MED ORDER — LISDEXAMFETAMINE DIMESYLATE 30 MG PO CAPS: 30.0000 mg | ORAL_CAPSULE | Freq: Every day | ORAL | 0 refills | Status: DC

## 2019-10-13 MED ORDER — CLONAZEPAM 0.5 MG PO TABS: 0.5000 mg | ORAL_TABLET | Freq: Three times a day (TID) | ORAL | 0 refills | Status: DC | PRN

## 2019-11-17 ENCOUNTER — Other Ambulatory Visit: Payer: Self-pay | Admitting: Family Medicine

## 2019-11-17 MED ORDER — CLONAZEPAM 0.5 MG PO TABS: 0.5000 mg | ORAL_TABLET | Freq: Three times a day (TID) | ORAL | 0 refills | Status: DC | PRN

## 2019-11-17 MED ORDER — LISDEXAMFETAMINE DIMESYLATE 30 MG PO CAPS: 30.0000 mg | ORAL_CAPSULE | Freq: Every day | ORAL | 0 refills | Status: DC

## 2019-11-17 NOTE — Telephone Encounter (Signed)
Ok to refill??  Last office visit 06/21/2019.  Last refill 10/13/2019 on both

## 2019-12-01 DIAGNOSIS — Z114 Encounter for screening for human immunodeficiency virus [HIV]: Secondary | ICD-10-CM | POA: Diagnosis not present

## 2019-12-01 DIAGNOSIS — Z113 Encounter for screening for infections with a predominantly sexual mode of transmission: Secondary | ICD-10-CM | POA: Diagnosis not present

## 2019-12-17 ENCOUNTER — Other Ambulatory Visit: Payer: Self-pay | Admitting: Family Medicine

## 2019-12-19 MED ORDER — CLONAZEPAM 0.5 MG PO TABS: 0.5000 mg | ORAL_TABLET | Freq: Three times a day (TID) | ORAL | 0 refills | Status: DC | PRN

## 2019-12-19 MED ORDER — LISDEXAMFETAMINE DIMESYLATE 30 MG PO CAPS: 30.0000 mg | ORAL_CAPSULE | Freq: Every day | ORAL | 0 refills | Status: DC

## 2019-12-19 NOTE — Telephone Encounter (Signed)
Requested Prescriptions   Pending Prescriptions Disp Refills  . lisdexamfetamine (VYVANSE) 30 MG capsule 30 capsule 0    Sig: Take 1 capsule (30 mg total) by mouth daily.  . clonazePAM (KLONOPIN) 0.5 MG tablet 60 tablet 0    Sig: Take 1 tablet (0.5 mg total) by mouth 3 (three) times daily as needed for anxiety.     Last OV 06/21/2019   Last written 11/17/2019

## 2020-01-18 ENCOUNTER — Other Ambulatory Visit: Payer: Self-pay | Admitting: Family Medicine

## 2020-01-18 NOTE — Telephone Encounter (Signed)
Ok to refill??  Last office visit 06/21/2019.  Last refill 12/19/2019 on both.

## 2020-01-19 MED ORDER — CLONAZEPAM 0.5 MG PO TABS: 0.5000 mg | ORAL_TABLET | Freq: Three times a day (TID) | ORAL | 0 refills | Status: DC | PRN

## 2020-01-19 MED ORDER — LISDEXAMFETAMINE DIMESYLATE 30 MG PO CAPS: 30.0000 mg | ORAL_CAPSULE | Freq: Every day | ORAL | 0 refills | Status: DC

## 2020-02-17 ENCOUNTER — Other Ambulatory Visit: Payer: Self-pay | Admitting: Family Medicine

## 2020-02-17 MED ORDER — CLONAZEPAM 0.5 MG PO TABS: 0.5000 mg | ORAL_TABLET | Freq: Three times a day (TID) | ORAL | 0 refills | Status: DC | PRN

## 2020-02-17 MED ORDER — LISDEXAMFETAMINE DIMESYLATE 30 MG PO CAPS: 30.0000 mg | ORAL_CAPSULE | Freq: Every day | ORAL | 0 refills | Status: DC

## 2020-03-19 ENCOUNTER — Other Ambulatory Visit: Payer: Self-pay | Admitting: Family Medicine

## 2020-03-19 DIAGNOSIS — N907 Vulvar cyst: Secondary | ICD-10-CM | POA: Diagnosis not present

## 2020-03-19 MED ORDER — CLONAZEPAM 0.5 MG PO TABS: 0.5000 mg | ORAL_TABLET | Freq: Three times a day (TID) | ORAL | 0 refills | Status: DC | PRN

## 2020-03-19 MED ORDER — LISDEXAMFETAMINE DIMESYLATE 30 MG PO CAPS: 30.0000 mg | ORAL_CAPSULE | Freq: Every day | ORAL | 0 refills | Status: DC

## 2020-03-19 NOTE — Telephone Encounter (Signed)
Ok to refill??  Last office visit 06/21/2019.  Last refill 02/17/2020 on both.

## 2020-04-02 ENCOUNTER — Other Ambulatory Visit: Payer: Self-pay

## 2020-04-02 ENCOUNTER — Ambulatory Visit (INDEPENDENT_AMBULATORY_CARE_PROVIDER_SITE_OTHER): Payer: BC Managed Care – PPO | Admitting: Family Medicine

## 2020-04-02 VITALS — BP 110/80 | HR 84 | Temp 98.0°F | Ht 62.0 in | Wt 155.0 lb

## 2020-04-02 DIAGNOSIS — G44201 Tension-type headache, unspecified, intractable: Secondary | ICD-10-CM | POA: Diagnosis not present

## 2020-04-02 MED ORDER — BUTALBITAL-APAP-CAFFEINE 50-325-40 MG PO TABS
1.0000 | ORAL_TABLET | Freq: Four times a day (QID) | ORAL | 0 refills | Status: AC | PRN
Start: 2020-04-02 — End: 2021-04-02

## 2020-04-02 MED ORDER — PREDNISONE 20 MG PO TABS: ORAL_TABLET | ORAL | 0 refills | Status: DC

## 2020-04-02 NOTE — Progress Notes (Signed)
Subjective:    Patient ID: [name], female    DOB: October 11, 1985, 34 y.o.   MRN: [id number]  Patient is a very pleasant 34 year old Caucasian female who presents with a severe headache for 1 week.  The headache is located in her occiput on the right-hand side near the insertion of the trapezius.  She states that she just woke up with a headache approximately 1 week ago.  She denies any head injury or trauma.  She denies any falls or concussion.  However it is tender to palpation in the area where the headache originates from.  Is a constant pain.  There is no pulsation.  It is not made worse by Valsalva.  She denies any photophobia or phonophobia or nausea or vomiting or dizziness.  There is no papilledema on funduscopic exam today.  Cranial nerves II through XII are grossly intact with muscle strength 5/5 equal and symmetric in the extremities.  She denies any dizziness or disequilibrium.  She denies any visual aura.  It is made worse with palpation of the trapezius muscle.  The pain seems very superficial similar to a tension headache or a ""crick in the neck" Past Medical History:  Diagnosis Date   Bartholin cyst    LEFT   Cervical dysplasia    Depression    Vaginal Pap smear, abnormal    Past Surgical History:  Procedure Laterality Date   CERVICAL BIOPSY  W/ LOOP ELECTRODE EXCISION  2012   CESAREAN SECTION     '05; '07   CESAREAN SECTION N/A 02/10/2018   Procedure: REPEAT CESAREAN SECTION;  Surgeon: Harold Hedge, MD;  Location: [facility] BIRTHING SU[facility];  Service: Obstetrics;  Laterality: N/A;  Repeat edc 02/14/18 NKDA Heather,  RNFA   CHOLECYSTECTOMY  2011   LAPAROSCOPIC REMOVAL RENAL CYST Left 12/12/2015   Procedure: XI ROBOTIC LEFT RENAL CYST DECORTICATION WITH LEFT NEPHROPEXY;  Surgeon: Sebastian Ache, MD;  Location: [facility] ORS;  Service: Urology;  Laterality: Left;   ORIF CLAVICULAR FRACTURE  06/10/2011   Procedure: OPEN REDUCTION INTERNAL FIXATION (ORIF) CLAVICULAR FRACTURE;   Surgeon: Cammy Copa, MD;  Location: [facility] OR;  Service: Orthopedics;  Laterality: Right;  ORIF Right clavicle   Current Outpatient Medications on File Prior to Visit  Medication Sig Dispense Refill   clonazePAM (KLONOPIN) 0.5 MG tablet Take 1 tablet (0.5 mg total) by mouth 3 (three) times daily as needed for anxiety. 60 tablet 0   lisdexamfetamine (VYVANSE) 30 MG capsule Take 1 capsule (30 mg total) by mouth daily. 30 capsule 0   sertraline (ZOLOFT) 50 MG tablet TAKE 1 TABLET BY MOUTH EVERY DAY 90 tablet 2   SPRINTEC 28 0.25-35 MG-MCG tablet Take 1 tablet by mouth daily.     No current facility-administered medications on file prior to visit.   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   Drug use: No   Sexual activity: Yes    Birth control/protection: Pill  Other Topics Concern   Not on file  Social History Narrative   Fun: Work and hang out with children   Denies abuse and feels safe at home.    Social Determinants of Health   Financial Resource Strain: Not on file  Food Insecurity: Not on file  Transportation Needs: Not on file  Physical Activity: Not on file  Stress: Not on file  Social Connections: Not on file  Intimate Partner Violence: Not on file     Review of Systems  Neurological: Positive for headaches.  All other systems reviewed and are negative.      Objective:   Physical  Exam Vitals reviewed.  Constitutional:      General: She is not in acute distress.    Appearance: She is well-developed and normal weight. She is not ill-appearing or toxic-appearing.  Eyes:     General: No visual field deficit.    Extraocular Movements: Extraocular movements intact.     Right eye: Normal extraocular motion and no nystagmus.     Left eye: Normal extraocular motion and no nystagmus.     Pupils: Pupils are equal, round, and reactive to light.     Right eye: Pupil is round and reactive.     Left eye: Pupil is round and reactive.  Cardiovascular:     Rate and Rhythm: Normal rate and regular rhythm.     Heart sounds: Normal heart sounds.  Pulmonary:     Effort: Pulmonary effort is normal. No respiratory distress.     Breath sounds: Normal breath sounds. No wheezing or rales.  Neurological:     Mental Status: She is alert and oriented to person, place, and time. Mental status is at baseline.     Cranial Nerves: No cranial nerve deficit, dysarthria or facial asymmetry.     Sensory: No sensory deficit.     Motor: No weakness.     Coordination: Romberg sign negative. Coordination normal.     Gait: Gait normal.  Psychiatric:        Behavior: Behavior normal.        Thought Content: Thought content normal.        Judgment: Judgment normal.           Assessment & Plan:  Acute intractable tension-type headache  I believe the patient has an acute intractable tension headache.  Begin a prednisone taper pack as an anti-inflammatory and use Fioricet 1 tablet every 4-6 hours as needed for headache.  Reassess in 1 week if no better or immediately if worse.  The pain seems very superficial and therefore I am not concerned about increased intracranial pressure based on her description and exam

## 2020-04-10 DIAGNOSIS — Z6827 Body mass index (BMI) 27.0-27.9, adult: Secondary | ICD-10-CM | POA: Diagnosis not present

## 2020-04-10 DIAGNOSIS — Z01419 Encounter for gynecological examination (general) (routine) without abnormal findings: Secondary | ICD-10-CM | POA: Diagnosis not present

## 2020-04-17 DIAGNOSIS — U071 COVID-19: Secondary | ICD-10-CM | POA: Diagnosis not present

## 2020-05-02 ENCOUNTER — Other Ambulatory Visit: Payer: Self-pay | Admitting: Family Medicine

## 2020-05-02 NOTE — Telephone Encounter (Signed)
Ok to refill??  Last office visit 04/02/2020.  Last refill 03/19/2020 on both.

## 2020-05-03 DIAGNOSIS — N9089 Other specified noninflammatory disorders of vulva and perineum: Secondary | ICD-10-CM | POA: Diagnosis not present

## 2020-05-03 MED ORDER — LISDEXAMFETAMINE DIMESYLATE 30 MG PO CAPS
30.0000 mg | ORAL_CAPSULE | Freq: Every day | ORAL | 0 refills | Status: DC
Start: 2020-05-03 — End: 2020-06-04

## 2020-05-03 MED ORDER — CLONAZEPAM 0.5 MG PO TABS: 0.5000 mg | ORAL_TABLET | Freq: Three times a day (TID) | ORAL | 0 refills | Status: DC | PRN

## 2020-05-14 DIAGNOSIS — N871 Moderate cervical dysplasia: Secondary | ICD-10-CM | POA: Diagnosis not present

## 2020-05-14 DIAGNOSIS — R87810 Cervical high risk human papillomavirus (HPV) DNA test positive: Secondary | ICD-10-CM | POA: Diagnosis not present

## 2020-05-14 DIAGNOSIS — R87619 Unspecified abnormal cytological findings in specimens from cervix uteri: Secondary | ICD-10-CM | POA: Diagnosis not present

## 2020-05-24 DIAGNOSIS — N9089 Other specified noninflammatory disorders of vulva and perineum: Secondary | ICD-10-CM | POA: Diagnosis not present

## 2020-05-24 DIAGNOSIS — N750 Cyst of Bartholin's gland: Secondary | ICD-10-CM | POA: Diagnosis not present

## 2020-05-30 ENCOUNTER — Other Ambulatory Visit: Payer: Self-pay | Admitting: Family Medicine

## 2020-05-30 NOTE — Telephone Encounter (Signed)
Pls call pt to schedule appt for future refills per not from pcp

## 2020-06-04 ENCOUNTER — Encounter: Payer: Self-pay | Admitting: Family Medicine

## 2020-06-04 ENCOUNTER — Ambulatory Visit: Payer: BC Managed Care – PPO | Admitting: Family Medicine

## 2020-06-04 ENCOUNTER — Other Ambulatory Visit: Payer: Self-pay

## 2020-06-04 VITALS — BP 120/82 | HR 105 | Ht 62.0 in | Wt 151.0 lb

## 2020-06-04 DIAGNOSIS — F411 Generalized anxiety disorder: Secondary | ICD-10-CM

## 2020-06-04 DIAGNOSIS — F988 Other specified behavioral and emotional disorders with onset usually occurring in childhood and adolescence: Secondary | ICD-10-CM | POA: Diagnosis not present

## 2020-06-04 MED ORDER — CLONAZEPAM 0.5 MG PO TABS: 0.5000 mg | ORAL_TABLET | Freq: Three times a day (TID) | ORAL | 0 refills | Status: DC | PRN

## 2020-06-04 MED ORDER — BUPROPION HCL ER (XL) 150 MG PO TB24: 150.0000 mg | ORAL_TABLET | Freq: Every day | ORAL | 3 refills | Status: DC

## 2020-06-04 MED ORDER — LISDEXAMFETAMINE DIMESYLATE 30 MG PO CAPS
30.0000 mg | ORAL_CAPSULE | Freq: Every day | ORAL | 0 refills | Status: DC
Start: 2020-06-04 — End: 2020-07-05

## 2020-06-04 NOTE — Progress Notes (Signed)
Subjective:    Patient ID: [name], female    DOB: July 14, 1985, 35 y.o.   MRN: [id number]  Medication Refill    05/01/14 Please see my last office visit. After starting the Effexor, the patient spiraled out of control regarding her depression. She became hopeless. She tried to commit suicide by overdosing on Ambien. She was admitted the hospital in December and For several days. She was discharged home on Effexor, Ambien, Klonopin, Atarax. At the present time she is only taking Klonopin and Ambien. She is requesting a refill on both of these. She has not seen psychiatry. She went to [facility], and was very dissatisfied with organization there and would like a referral to a different psychiatrist. Today she is complaining of depression. Zoloft did not work for the patient. She did not tolerate Effexor. She does not display any manic symptoms or symptoms of bipolar. She denies any auditory hallucinations or visual hallucinations.  At that time, my plan was: Patient denies any suicidal ideation today. Her behavior, judgment, and insight seem appropriate. I will start the patient on Brintellix 5 mg a day as an antidepressant and then increase to 10 mg a day in one week. I will see the patient back in 2 weeks to recheck her or immediately if worse. I will not refill her Ambien. I will give the patient Klonopin 0.5 mg by mouth twice a day but I will limit her to 30 pills and I will not refill those pills again until she is seen in 2 weeks. In the meantime I will try to arrange a referral to another psychiatrist..    05/26/14 Patient here for follow up.  She is doing much better on Brintellix 10 mg by mouth daily. She denies any suicidal ideation or insomnia. She is using the Klonopin less frequently. She no longer wants to see a psychiatrist. She denies any manic symptoms. Overall she is doing well. She is back in school and scheduled to graduate in May. She denies any anhedonia, poor concentration, change in  appetite.  At that time, my plan was: Continue Brintellix 10 mg by mouth daily. I will refill Klonopin 0.5 mg tablets 1 by mouth daily when necessary insomnia or anxiety. I recommended she try to wean down on this medication when she is trying to do. Follow up again in 3 months or immediately if worse  04/15/18 Patient states that she did very well while taking Brintellix.  Ultimately after she graduated, the stress in her life improved, and she was able to discontinue the medication.  She has not had any issues with depression or anxiety since.  However she recently had a baby girl.  Her daughter is approximately 35 months old.  After the birth of her daughter, the patient states that she has developed constant anxiety.  She denies having panic attacks.  Instead she feels anxious from the time she wakes up in the morning until the time that she goes to bed at night.  She is just worried that something is going to happen to the baby of that something is going to go wrong.  Is triggered worsening depression.  She realizes that some of this is an adjustment phase and that is likely exacerbating the situation however symptoms have not improved over the last 2 months.  She denies any suicidal ideation.  She denies any homicidal ideation.  She has no thoughts of wanting to hurt the baby.  She states that she loves being a  mom and the baby is the brightest part of her day.  However there is social stressors at home.  The father of the baby ended the relationship.  However he is staying in the home with the patient to help care for the baby.  This creates an awkward living arrangement that only adds to stress.  She also has to return to work in a few weeks and this has her feeling extremely anxious as well.  At that time, my plan was: Patient is being very proactive in trying to get ahead of this prior to the situation worsening.  She would like to resume her previous medication.  Therefore I will start her on  Trintellix 5 mg daily and increase to 10 mg in 2 weeks.  She can use Klonopin 0.5 mg p.o. every 8 hours as needed anxiety.  Reassess in 1 month or sooner if worsening.  05/13/18 Patient had to take Trintellix 5 mg a day for 3 weeks to allow her body to adjust to the medication due to nausea.  After 3 weeks she was gradually able to increase to 10 mg a day of the Trintellix.  Therefore she is only been on the 10 mg dose for approximately a week.  She states that she can definitely tell an improvement in the depression.  She denies any suicidal thoughts or or thoughts of wanting to hurt the baby.  She is still suffering with anxiety although this is gradually getting better.  She seen substantial benefit from the Klonopin.  She is using that every 3 or 4 days.  She is not taking it with any regularity.  Her biggest concern for anxiety as she is supposed to return to work on Monday.  She is very hesitant and scared to leave the baby.  She would like to resume her Vyvanse that she was previously taking for ADD.  In the past she was on 30 mg a day of Vyvanse.  This medication allowed her to maintain her focus and concentration at work.  Without the medication she finds herself easily distracted and has a difficult time completing her assigned functions.  The patient works as a Clinical research associate.  She denied any previous history of anxiety when she took the medication or insomnia when she took the medication.  At that time, my plan was: Depression is much better.  Continue Trintellix 10 mg a day.  Anxiety is improving.  The patient is gradually improving from this.  She is using Klonopin sparingly.  Reassess in 3 weeks after the Trintellix has had more time to take effect at a higher dose.  If persistent, consider adding BuSpar.  Resume Vyvanse 30 mg a day and reassess via telephone in 3 to 4 weeks.  Check in sooner if symptoms worsen  06/21/19 Patient is here today at my request because we would not refill her medication before  she has an office visit.  She has not been seen in more than a year.  Patient stopped Trintellix shortly after when I saw her last time due to nausea and upset stomach.  At the present time she is taking Klonopin twice a day every day to help manage her anxiety.  She is not taking any other medication for that.  She also takes Vyvanse 2-3 times a week.  She only takes the Vyvanse when she goes into the office to help her focus.  When she is working at home she does not require the Vyvanse.  However she  is under tremendous stress.  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  She denies any depression.  She denies any suicidal ideation however her blood pressure is extremely high today for her age.  She is not taking any type of birth control that would explain the elevated blood pressure.  She has not taken the Vyvanse today.  She denies taking any stimulants.  She does feel that she is under a lot of stress.  She denies any chest pain or shortness of breath or dyspnea on exertion.  At that time, my plan was: I had a long discussion today with the patient regarding the situation.  I do not feel that taking Klonopin twice a day every day for however is the best way to manage her anxiety.  I believe that in time she will become dependent on the medication and I will lose its effectiveness.  I also believe that her stress and anxiety is likely contributing to her elevated blood pressure.  Therefore I recommended that we better try to manage her anxiety.  I will start her on Zoloft 50 mg p.o. nightly in addition to the Klonopin.  I would asked to recheck the patient via email in 3 to 4 weeks.  If she is doing better, we will continue that.  If not we may supplement Wellbutrin with Zoloft as the patient has previously in the past tried Zoloft, Lexapro, Effexor, and Trintellix.  I believe eventually she will need a combination of an SSRI and Wellbutrin to manage her anxiety without giving  her side effects in addition to the Klonopin.  We will continue the Vyvanse which the patient is using sparingly for focus.  This seems to help her and not exacerbate her anxiety.  If anything it may actually help her anxiety by helping her focus at work and being more productive removing some of the stress off her plate.  Of asked the patient to check her blood pressure every day at home and email me the values in 1 to 2 weeks.  I believe this is likely due to anxiety however if persistently elevated I would recommend a renal ultrasound.  Meanwhile check a CBC and a CMP.  06/04/20 Patient is here today for a follow-up.  She is taking Klonopin twice a day.  She stopped the Zoloft after taking it only 1 week because she thought that that medication made her extremely sleepy like a zombie.  Reports she did not take the medication long enough to evaluate whether it was beneficial or not.  She is still taking the Klonopin twice a day.  I explained to the patient that I am worried about long-term habituation and dependency taking the medication twice a day on a daily basis at a young age.  I would like to try something on a daily basis to better help manage the anxiety.  Obviously she has a lot of stress as a single mother and working as an Pensions consultant.  On a positive note, the Vyvanse 30 mg a day seems to be working well.  She takes this 3-4 times a week.  The medication wears off around 2 in the afternoon but it does allow her to maintain her focus at work such that she can a complete her assigned task and duties without becoming easily distracted or starting assignments without finishing them.  She denies any chest pain or palpitations due to the medication.  She denies any insomnia.  She denies any suicidal ideation or depression Past Medical History:  Diagnosis Date  . Bartholin cyst    LEFT  . Cervical dysplasia   . Depression   . Vaginal Pap smear, abnormal    Past Surgical History:  Procedure Laterality  Date  . CERVICAL BIOPSY  W/ LOOP ELECTRODE EXCISION  2012  . CESAREAN SECTION     '05; '07  . CESAREAN SECTION N/A 02/10/2018   Procedure: REPEAT CESAREAN SECTION;  Surgeon: Harold Hedge, MD;  Location: [facility];  Service: Obstetrics;  Laterality: N/A;  Repeat edc 02/14/18 NKDA Heather,  RNFA  . CHOLECYSTECTOMY  2011  . LAPAROSCOPIC REMOVAL RENAL CYST Left 12/12/2015   Procedure: XI ROBOTIC LEFT RENAL CYST DECORTICATION WITH LEFT NEPHROPEXY;  Surgeon: Sebastian Ache, MD;  Location: [facility] OR[facility];  Service: Urology;  Laterality: Left;  . ORIF CLAVICULAR FRACTURE  06/10/2011   Procedure: OPEN REDUCTION INTERNAL FIXATION (ORIF) CLAVICULAR FRACTURE;  Surgeon: Cammy Copa, MD;  Location: [facility] OR;  Service: Orthopedics;  Laterality: Right;  ORIF Right clavicle   Current Outpatient Medications on File Prior to Visit  Medication Sig Dispense Refill  . butalbital-acetaminophen-caffeine (FIORICET) 50-325-40 MG tablet Take 1-2 tablets by mouth every 6 (six) hours as needed for headache. 20 tablet 0   No current facility-administered medications on file prior to visit.   No Known Allergies Social History   Socioeconomic History  . Marital status: Single    Spouse name: Not on file  . Number of children: 2  . Years of education: 35  . Highest education level: Not on file  Occupational History  . Occupation: Clinical research associate  Tobacco Use  . Smoking status: Former Smoker    Packs/day: 0.25    Years: 3.00    Pack years: 0.75    Quit date: 04/21/2009    Years since quitting: 11.1  . Smokeless tobacco: Never Used  . Tobacco comment: smoking was mostly social  Vaping Use  . Vaping Use: Never used  Substance and Sexual Activity  . Alcohol use: Yes    Comment: 1-3 glasses of wine a week.  . Drug use: No  . Sexual activity: Yes    Birth control/protection: Pill  Other Topics Concern  . Not on file  Social History Narrative   Fun: Work and hang out with children   Denies abuse and feels  safe at home.    Social Determinants of Health   Financial Resource Strain: Not on file  Food Insecurity: Not on file  Transportation Needs: Not on file  Physical Activity: Not on file  Stress: Not on file  Social Connections: Not on file  Intimate Partner Violence: Not on file     Review of Systems  All other systems reviewed and are negative.      Objective:   Physical Exam Vitals reviewed.  Cardiovascular:     Rate and Rhythm: Normal rate and regular rhythm.     Heart sounds: Normal heart sounds.  Pulmonary:     Effort: Pulmonary effort is normal. No respiratory distress.     Breath sounds: Normal breath sounds. No wheezing or rales.  Abdominal:     General: Bowel sounds are normal.     Palpations: Abdomen is soft.  Psychiatric:        Behavior: Behavior normal.        Thought Content: Thought content normal.        Judgment: Judgment normal.  Assessment & Plan:  GAD (generalized anxiety disorder)  ADD (attention deficit disorder) without hyperactivity  I will continue to prescribe Klonopin but I do suggest that we try another daily medication to try to help better manage and prevent the anxiety rather than scheduled benzodiazepines.  I have recommended that we try Wellbutrin XL 150 mg p.o. every morning and see if this helps.  I do not feel that it will have the sedation that the Zoloft had.  Continue Vyvanse 30 mg p.o. every morning with no changes.

## 2020-06-07 DIAGNOSIS — N9089 Other specified noninflammatory disorders of vulva and perineum: Secondary | ICD-10-CM | POA: Diagnosis not present

## 2020-06-29 ENCOUNTER — Other Ambulatory Visit: Payer: Self-pay | Admitting: Family Medicine

## 2020-07-05 ENCOUNTER — Other Ambulatory Visit: Payer: Self-pay | Admitting: Family Medicine

## 2020-07-09 MED ORDER — CLONAZEPAM 0.5 MG PO TABS: 0.5000 mg | ORAL_TABLET | Freq: Three times a day (TID) | ORAL | 0 refills | Status: DC | PRN

## 2020-07-09 MED ORDER — LISDEXAMFETAMINE DIMESYLATE 30 MG PO CAPS: 30.0000 mg | ORAL_CAPSULE | Freq: Every day | ORAL | 0 refills | Status: DC

## 2020-07-09 NOTE — Telephone Encounter (Signed)
Ok to refill??  Last office visit/ refill 06/04/2020 on both.

## 2020-07-10 DIAGNOSIS — H5213 Myopia, bilateral: Secondary | ICD-10-CM | POA: Diagnosis not present

## 2020-07-11 DIAGNOSIS — R87611 Atypical squamous cells cannot exclude high grade squamous intraepithelial lesion on cytologic smear of cervix (ASC-H): Secondary | ICD-10-CM | POA: Diagnosis not present

## 2020-07-11 DIAGNOSIS — Z3202 Encounter for pregnancy test, result negative: Secondary | ICD-10-CM | POA: Diagnosis not present

## 2020-07-11 DIAGNOSIS — N871 Moderate cervical dysplasia: Secondary | ICD-10-CM | POA: Diagnosis not present

## 2020-08-13 ENCOUNTER — Other Ambulatory Visit: Payer: Self-pay | Admitting: Family Medicine

## 2020-08-14 MED ORDER — CLONAZEPAM 0.5 MG PO TABS: 0.5000 mg | ORAL_TABLET | Freq: Three times a day (TID) | ORAL | 0 refills | Status: DC | PRN

## 2020-08-14 MED ORDER — LISDEXAMFETAMINE DIMESYLATE 30 MG PO CAPS: 30.0000 mg | ORAL_CAPSULE | Freq: Every day | ORAL | 0 refills | Status: DC

## 2020-08-14 NOTE — Telephone Encounter (Signed)
Ok to refill??  Last office visit 06/04/2020.  Last refill 07/09/2020 on both.

## 2020-09-19 ENCOUNTER — Other Ambulatory Visit: Payer: Self-pay | Admitting: Family Medicine

## 2020-09-19 NOTE — Telephone Encounter (Signed)
Ok to refill??  Last office visit 06/04/2020.  Last refill 08/14/2020 on both.

## 2020-09-20 MED ORDER — LISDEXAMFETAMINE DIMESYLATE 30 MG PO CAPS: 30.0000 mg | ORAL_CAPSULE | Freq: Every day | ORAL | 0 refills | Status: DC

## 2020-09-20 MED ORDER — CLONAZEPAM 0.5 MG PO TABS: 0.5000 mg | ORAL_TABLET | Freq: Three times a day (TID) | ORAL | 0 refills | Status: DC | PRN

## 2020-10-26 ENCOUNTER — Other Ambulatory Visit: Payer: Self-pay | Admitting: Family Medicine

## 2020-10-26 MED ORDER — CLONAZEPAM 0.5 MG PO TABS: 0.5000 mg | ORAL_TABLET | Freq: Three times a day (TID) | ORAL | 0 refills | Status: DC | PRN

## 2020-10-26 MED ORDER — LISDEXAMFETAMINE DIMESYLATE 30 MG PO CAPS: 30.0000 mg | ORAL_CAPSULE | Freq: Every day | ORAL | 0 refills | Status: DC

## 2020-10-26 NOTE — Telephone Encounter (Signed)
Ok to refill??  Last office visit 06/04/2020.  Last refill 09/20/2020 on both.

## 2020-11-15 DIAGNOSIS — I788 Other diseases of capillaries: Secondary | ICD-10-CM | POA: Diagnosis not present

## 2020-11-15 DIAGNOSIS — D2371 Other benign neoplasm of skin of right lower limb, including hip: Secondary | ICD-10-CM | POA: Diagnosis not present

## 2020-11-15 DIAGNOSIS — L814 Other melanin hyperpigmentation: Secondary | ICD-10-CM | POA: Diagnosis not present

## 2020-12-13 ENCOUNTER — Other Ambulatory Visit: Payer: Self-pay | Admitting: Family Medicine

## 2020-12-13 MED ORDER — LISDEXAMFETAMINE DIMESYLATE 30 MG PO CAPS: 30.0000 mg | ORAL_CAPSULE | Freq: Every day | ORAL | 0 refills | Status: DC

## 2020-12-13 MED ORDER — CLONAZEPAM 0.5 MG PO TABS
0.5000 mg | ORAL_TABLET | Freq: Three times a day (TID) | ORAL | 0 refills | Status: DC | PRN
Start: 2020-12-13 — End: 2021-02-01

## 2020-12-13 NOTE — Telephone Encounter (Signed)
Ok to refill??  Last office visit 06/04/2020.  Last refill 10/26/2020 on both.

## 2021-01-28 DIAGNOSIS — R87613 High grade squamous intraepithelial lesion on cytologic smear of cervix (HGSIL): Secondary | ICD-10-CM | POA: Diagnosis not present

## 2021-01-28 DIAGNOSIS — D260 Other benign neoplasm of cervix uteri: Secondary | ICD-10-CM | POA: Diagnosis not present

## 2021-01-28 DIAGNOSIS — D069 Carcinoma in situ of cervix, unspecified: Secondary | ICD-10-CM | POA: Diagnosis not present

## 2021-02-01 ENCOUNTER — Other Ambulatory Visit: Payer: Self-pay | Admitting: Family Medicine

## 2021-02-01 MED ORDER — CLONAZEPAM 0.5 MG PO TABS: 0.5000 mg | ORAL_TABLET | Freq: Three times a day (TID) | ORAL | 0 refills | Status: DC | PRN

## 2021-02-01 MED ORDER — LISDEXAMFETAMINE DIMESYLATE 30 MG PO CAPS: 30.0000 mg | ORAL_CAPSULE | Freq: Every day | ORAL | 0 refills | Status: DC

## 2021-02-01 NOTE — Telephone Encounter (Signed)
Ok to refill??  Last office visit 06/04/2020.  Last refill 12/13/2020 on both.

## 2021-03-16 ENCOUNTER — Other Ambulatory Visit: Payer: Self-pay | Admitting: Family Medicine

## 2021-03-18 MED ORDER — CLONAZEPAM 0.5 MG PO TABS: 0.5000 mg | ORAL_TABLET | Freq: Three times a day (TID) | ORAL | 0 refills | Status: DC | PRN

## 2021-03-18 MED ORDER — LISDEXAMFETAMINE DIMESYLATE 30 MG PO CAPS: 30.0000 mg | ORAL_CAPSULE | Freq: Every day | ORAL | 0 refills | Status: DC

## 2021-04-10 ENCOUNTER — Encounter: Payer: Self-pay | Admitting: Nurse Practitioner

## 2021-04-10 ENCOUNTER — Ambulatory Visit: Payer: BC Managed Care – PPO | Admitting: Nurse Practitioner

## 2021-04-10 ENCOUNTER — Other Ambulatory Visit: Payer: Self-pay

## 2021-04-10 VITALS — BP 112/76 | HR 86 | Ht 62.0 in | Wt 151.0 lb

## 2021-04-10 DIAGNOSIS — H6593 Unspecified nonsuppurative otitis media, bilateral: Secondary | ICD-10-CM | POA: Diagnosis not present

## 2021-04-10 MED ORDER — FLUTICASONE PROPIONATE 50 MCG/ACT NA SUSP: 2.0000 | Freq: Every day | NASAL | 6 refills | Status: DC

## 2021-04-10 NOTE — Progress Notes (Signed)
Subjective:    Patient ID: [name], female    DOB: 05/28/1985, 35 y.o.   MRN: [id number]  HPI: [name] is a 35 y.o. female presenting for ear pain.  Chief Complaint  Patient presents with   Ear Problem    Left side   EAR PAIN Had COVID about two weeks ago, mostly all better, still has cough and taste/smell not back all of the way yet.  Ear pain started about 6 weeks ago.  Duration: months Involved ear(s):  left > right  Severity:  2/10  Quality:  ringing, can't hear well Fever: no Otorrhea: no Upper respiratory infection symptoms: has leftover dry cough, no sinus pressure, pain in teeth Pruritus: no Hearing loss:  feels plugged bilaterally  Water immersion no Using Q-tips: no Recurrent otitis media: no Status: stable Treatments attempted:  nothing  No Known Allergies  Outpatient Encounter Medications as of 04/10/2021  Medication Sig   buPROPion (WELLBUTRIN XL) 150 MG 24 hr tablet TAKE 1 TABLET BY MOUTH EVERY DAY PLEASE TAKE DAILY TO HELP PREVENT ANXIETY   clonazePAM (KLONOPIN) 0.5 MG tablet Take 1 tablet (0.5 mg total) by mouth 3 (three) times daily as needed for anxiety.   fluticasone (FLONASE) 50 MCG/ACT nasal spray Place 2 sprays into both nostrils daily.   lisdexamfetamine (VYVANSE) 30 MG capsule Take 1 capsule (30 mg total) by mouth daily.   No facility-administered encounter medications on file as of 04/10/2021.    Patient Active Problem List   Diagnosis Date Noted   H/O cesarean section 02/10/2018   Renal cyst, left 12/12/2015   Renal mass 12/06/2015   Lung nodule 12/06/2015   Benzodiazepine overdose 03/30/2014   Depression 03/30/2014   Head injury    Suicide attempt [facility])    Drug overdose, intentional (HCC) 03/29/2014   Contraception management 01/18/2014   Hx of abnormal cervical Pap smear 01/18/2014    Past Medical History:  Diagnosis Date   Bartholin cyst    LEFT   Cervical dysplasia    Depression    Vaginal Pap smear, abnormal      Relevant past medical, surgical, family and social history reviewed and updated as indicated. Interim medical history since our last visit reviewed.  Review of Systems Per HPI unless specifically indicated above     Objective:    BP 112/76    Pulse 86    Ht 5\' 2"  (1.575 m)    Wt 151 lb (68.5 kg)    SpO2 97%    BMI 27.62 kg/m   Wt Readings from Last 3 Encounters:  04/10/21 151 lb (68.5 kg)  06/04/20 151 lb (68.5 kg)  04/02/20 155 lb (70.3 kg)    Physical Exam Vitals and nursing note reviewed.  Constitutional:      General: She is not in acute distress.    Appearance: Normal appearance. She is not toxic-appearing.  HENT:     Right Ear: Ear canal and external ear normal. A middle ear effusion is present. There is no impacted cerumen.     Left Ear: Ear canal and external ear normal. A middle ear effusion is present. There is no impacted cerumen.     Nose: Nose normal. No congestion.     Right Sinus: No maxillary sinus tenderness or frontal sinus tenderness.     Left Sinus: No maxillary sinus tenderness or frontal sinus tenderness.     Mouth/Throat:     Mouth: Mucous membranes are moist.  Pharynx: Oropharynx is clear. No oropharyngeal exudate or posterior oropharyngeal erythema.  Eyes:     Extraocular Movements: Extraocular movements intact.  Cardiovascular:     Rate and Rhythm: Normal rate and regular rhythm.     Heart sounds: Normal heart sounds. No murmur heard. Pulmonary:     Effort: Pulmonary effort is normal. No respiratory distress.     Breath sounds: Normal breath sounds. No wheezing, rhonchi or rales.  Skin:    General: Skin is warm and dry.     Coloration: Skin is not jaundiced or pale.     Findings: No erythema.  Neurological:     Mental Status: She is alert and oriented to person, place, and time.  Psychiatric:        Mood and Affect: Mood normal.        Behavior: Behavior normal.        Thought Content: Thought content normal.        Judgment: Judgment  normal.      Assessment & Plan:  1. Bilateral otitis media with effusion Acute.  Related to recent viral infection.  No erythema or acute infection present today.  Start flonase daily to help with fluid.  Follow up if pain worsens or with any drainage from ear.     Follow up plan: Return if symptoms worsen or fail to improve.

## 2021-04-15 ENCOUNTER — Other Ambulatory Visit: Payer: Self-pay | Admitting: Family Medicine

## 2021-04-16 ENCOUNTER — Other Ambulatory Visit: Payer: Self-pay | Admitting: Family Medicine

## 2021-04-16 MED ORDER — CLONAZEPAM 0.5 MG PO TABS: 0.5000 mg | ORAL_TABLET | Freq: Three times a day (TID) | ORAL | 0 refills | Status: DC | PRN

## 2021-04-16 MED ORDER — LISDEXAMFETAMINE DIMESYLATE 30 MG PO CAPS: 30.0000 mg | ORAL_CAPSULE | Freq: Every day | ORAL | 0 refills | Status: DC

## 2021-04-16 NOTE — Telephone Encounter (Signed)
LOV 06/04/2020 Last refill: Clonazepam 03/18/2021 # 60, 0 refills Vyvanse 03/18/2021 #30, 0 refills

## 2021-04-18 ENCOUNTER — Ambulatory Visit: Payer: BC Managed Care – PPO | Admitting: Family Medicine

## 2021-04-18 ENCOUNTER — Encounter: Payer: Self-pay | Admitting: Family Medicine

## 2021-04-18 ENCOUNTER — Other Ambulatory Visit: Payer: Self-pay

## 2021-04-18 VITALS — BP 132/100 | HR 131 | Ht 62.0 in | Wt 151.0 lb

## 2021-04-18 DIAGNOSIS — R000 Tachycardia, unspecified: Secondary | ICD-10-CM

## 2021-04-18 DIAGNOSIS — H6593 Unspecified nonsuppurative otitis media, bilateral: Secondary | ICD-10-CM | POA: Diagnosis not present

## 2021-04-18 MED ORDER — AMOXICILLIN 875 MG PO TABS: 875.0000 mg | ORAL_TABLET | Freq: Two times a day (BID) | ORAL | 0 refills | Status: AC

## 2021-04-18 NOTE — Progress Notes (Signed)
Subjective:    Patient ID: [name], female    DOB: 21-Jun-1985, 35 y.o.   MRN: [id number]  Patient is a very pleasant 35 year old Caucasian female who presents today primarily for refill on medication.  She was due for discussion about her Klonopin and Vyvanse.  However 3 weeks ago she had COVID.  She denies any chest pain or shortness of breath however she has noticed her heart racing recently.  Here today her heart rate was 130-140 bpm with the patient is sitting here at rest.  She denies taking any stimulants.  She has not even taken her Vyvanse today.  She is not on Sudafed or any Caffeine.  She denies any chest pain.  She denies any pleurisy or hemoptysis.  She denies any unilateral leg swelling.  There is no evidence of a DVT today on her physical exam.  She denies any syncope or near syncope.  Denies any recent weight loss or weight changes just of thyroid abnormality.  She denies feeling anxious or panic.  She is not in pain.  She does have bilateral ear pain and decreased hearing in her left ear.  On examination she does have an effusion behind her left tympanic membrane the right tympanic membrane is erythematous and dull suggesting an ear infection.  However I am very concerned about her tachycardia.  She does not appear dehydrated.  She denies any illicit drugs or stimulants. Past Medical History:  Diagnosis Date   Bartholin cyst    LEFT   Cervical dysplasia    Depression    Vaginal Pap smear, abnormal    Past Surgical History:  Procedure Laterality Date   CERVICAL BIOPSY  W/ LOOP ELECTRODE EXCISION  2012   CESAREAN SECTION     '05; '07   CESAREAN SECTION N/A 02/10/2018   Procedure: REPEAT CESAREAN SECTION;  Surgeon: Harold Hedge, MD;  Location: [facility];  Service: Obstetrics;  Laterality: N/A;  Repeat edc 02/14/18 NKDA Heather,  RNFA   CHOLECYSTECTOMY  2011   LAPAROSCOPIC REMOVAL RENAL CYST Left 12/12/2015   Procedure: XI ROBOTIC LEFT RENAL CYST DECORTICATION  WITH LEFT NEPHROPEXY;  Surgeon: Sebastian Ache, MD;  Location: [facility];  Service: Urology;  Laterality: Left;   ORIF CLAVICULAR FRACTURE  06/10/2011   Procedure: OPEN REDUCTION INTERNAL FIXATION (ORIF) CLAVICULAR FRACTURE;  Surgeon: Cammy Copa, MD;  Location: [facility] OR;  Service: Orthopedics;  Laterality: Right;  ORIF Right clavicle   Current Outpatient Medications on File Prior to Visit  Medication Sig Dispense Refill   clonazePAM (KLONOPIN) 0.5 MG tablet Take 1 tablet (0.5 mg total) by mouth 3 (three) times daily as needed for anxiety. 60 tablet 0   fluticasone (FLONASE) 50 MCG/ACT nasal spray Place 2 sprays into both nostrils daily. 16 g 6   lisdexamfetamine (VYVANSE) 30 MG capsule Take 1 capsule (30 mg total) by mouth daily. 30 capsule 0   No current facility-administered medications on file prior to visit.   No Known Allergies Social History   Socioeconomic History   Marital status: Single    Spouse name: Not on file   Number of children: 2   Years of education: 35   Highest education level: Not on file  Occupational History   Occupation: Lawyer  Tobacco Use   Smoking status: Former    Packs/day: 0.25    Years: 3.00    Pack years: 0.75    Types: Cigarettes    Quit date: 04/21/2009    Years since  quitting: 12.0   Smokeless tobacco: Never   Tobacco comments:    smoking was mostly social  Vaping Use   Vaping Use: Never used  Substance and Sexual Activity   Alcohol use: Yes    Comment: 1-3 glasses of wine a week.   Drug use: No   Sexual activity: Yes    Birth control/protection: Pill  Other Topics Concern   Not on file  Social History Narrative   Fun: Work and hang out with children   Denies abuse and feels safe at home.    Social Determinants of Health   Financial Resource Strain: Not on file  Food Insecurity: Not on file  Transportation Needs: Not on file  Physical Activity: Not on file  Stress: Not on file  Social Connections: Not on file  Intimate Partner  Violence: Not on file     Review of Systems  All other systems reviewed and are negative.     Objective:   Physical Exam Vitals reviewed.  Constitutional:      General: She is not in acute distress.    Appearance: Normal appearance. She is normal weight. She is not ill-appearing, toxic-appearing or diaphoretic.  HENT:     Head: Normocephalic and atraumatic.     Right Ear: Tympanic membrane is injected and erythematous.     Left Ear: A middle ear effusion is present. Tympanic membrane is injected and erythematous.  Cardiovascular:     Rate and Rhythm: Regular rhythm. Tachycardia present.     Heart sounds: Normal heart sounds. No murmur heard.   No friction rub. No gallop.  Pulmonary:     Effort: Pulmonary effort is normal. No respiratory distress.     Breath sounds: Normal breath sounds. No wheezing or rales.  Musculoskeletal:     Right lower leg: No edema.     Left lower leg: No edema.  Skin:    Coloration: Skin is not jaundiced.     Findings: No bruising, erythema or rash.  Neurological:     General: No focal deficit present.     Mental Status: She is alert and oriented to person, place, and time.     Motor: No weakness.     Gait: Gait normal.  Psychiatric:        Mood and Affect: Mood normal.        Behavior: Behavior normal.        Thought Content: Thought content normal.        Judgment: Judgment normal.          Assessment & Plan:  Tachycardia - Plan: EKG 12-Lead, CBC with Differential/Platelet, COMPLETE METABOLIC PANEL WITH GFR, TSH, D-dimer, quantitative  Bilateral otitis media with effusion EKG today shows sinus tachycardia with normal intervals and a normal axis.  There is no evidence of cardiac ischemia.  Rhythm is definitely sinus.  Therefore this is not a cardiac apnea.  The question is why is the patient in sinus tachycardia.  She looks completely comfortable at rest and agrees.  She did not appear in pain or anxious.  Therefore I will check a CBC to  rule out anemia.  I will check a TSH to evaluate for hyperthyroidism.  Patient clinically does not appear to be in any distress but I will check a D-dimer to evaluate for both pain possibility of a pulmonary embolism given her recent COVID diagnosis.  I asked her to avoid any stimulants until we have an answer as to what is going on.  She does not appear clinically dehydrated.  I will treat the otitis media with amoxicillin 875 mg twice daily for 10 days.  Await results of the lab work required whether further work-up is necessary.

## 2021-04-19 LAB — COMPLETE METABOLIC PANEL WITH GFR
AG Ratio: 1.2 (calc) (ref 1.0–2.5)
ALT: 62 U/L — ABNORMAL HIGH (ref 6–29)
AST: 47 U/L — ABNORMAL HIGH (ref 10–30)
Albumin: 4.2 g/dL (ref 3.6–5.1)
Alkaline phosphatase (APISO): 91 U/L (ref 31–125)
BUN: 7 mg/dL (ref 7–25)
CO2: 24 mmol/L (ref 20–32)
Calcium: 9.3 mg/dL (ref 8.6–10.2)
Chloride: 102 mmol/L (ref 98–110)
Creat: 0.56 mg/dL (ref 0.50–0.97)
Globulin: 3.4 g/dL (calc) (ref 1.9–3.7)
Glucose, Bld: 95 mg/dL (ref 65–99)
Potassium: 3.9 mmol/L (ref 3.5–5.3)
Sodium: 138 mmol/L (ref 135–146)
Total Bilirubin: 0.9 mg/dL (ref 0.2–1.2)
Total Protein: 7.6 g/dL (ref 6.1–8.1)
eGFR: 122 mL/min/{1.73_m2} (ref >=60)

## 2021-04-19 LAB — CBC WITH DIFFERENTIAL/PLATELET
Absolute Monocytes: 994 cells/uL — ABNORMAL HIGH (ref 200–950)
Basophils Absolute: 70 cells/uL (ref 0–200)
Basophils Relative: 0.5 %
Eosinophils Absolute: 140 cells/uL (ref 15–500)
Eosinophils Relative: 1 %
HCT: 38.9 % (ref 35.0–45.0)
Hemoglobin: 13.2 g/dL (ref 11.7–15.5)
Lymphs Abs: 3136 cells/uL (ref 850–3900)
MCH: 30.5 pg (ref 27.0–33.0)
MCHC: 33.9 g/dL (ref 32.0–36.0)
MCV: 89.8 fL (ref 80.0–100.0)
MPV: 11.6 fL (ref 7.5–12.5)
Monocytes Relative: 7.1 %
Neutro Abs: 9660 cells/uL — ABNORMAL HIGH (ref 1500–7800)
Neutrophils Relative %: 69 %
Platelets: 317 10*3/uL (ref 140–400)
RBC: 4.33 10*6/uL (ref 3.80–5.10)
RDW: 11.7 % (ref 11.0–15.0)
Total Lymphocyte: 22.4 %
WBC: 14 10*3/uL — ABNORMAL HIGH (ref 3.8–10.8)

## 2021-04-19 LAB — TSH: TSH: 3.24 mIU/L

## 2021-04-19 LAB — D-DIMER, QUANTITATIVE: D-Dimer, Quant: 0.3 mcg/mL FEU (ref <0.50)

## 2021-04-25 ENCOUNTER — Other Ambulatory Visit: Payer: Self-pay

## 2021-04-25 ENCOUNTER — Ambulatory Visit: Payer: BC Managed Care – PPO | Admitting: Family Medicine

## 2021-04-25 ENCOUNTER — Encounter: Payer: Self-pay | Admitting: Family Medicine

## 2021-04-25 VITALS — BP 126/88 | HR 120 | Ht 62.0 in | Wt 152.0 lb

## 2021-04-25 DIAGNOSIS — R000 Tachycardia, unspecified: Secondary | ICD-10-CM | POA: Diagnosis not present

## 2021-04-25 DIAGNOSIS — D72829 Elevated white blood cell count, unspecified: Secondary | ICD-10-CM | POA: Diagnosis not present

## 2021-04-25 DIAGNOSIS — R7989 Other specified abnormal findings of blood chemistry: Secondary | ICD-10-CM

## 2021-04-25 DIAGNOSIS — H6982 Other specified disorders of Eustachian tube, left ear: Secondary | ICD-10-CM

## 2021-04-25 NOTE — Progress Notes (Signed)
Subjective:    Patient ID: [name], female    DOB: 08/13/1985, 36 y.o.   MRN: [id number] 04/18/21 Patient is a very pleasant 36 year old Caucasian female who presents today primarily for refill on medication.  She was due for discussion about her Klonopin and Vyvanse.  However 3 weeks ago she had COVID.  She denies any chest pain or shortness of breath however she has noticed her heart racing recently.  Here today her heart rate was 130-140 bpm with the patient is sitting here at rest.  She denies taking any stimulants.  She has not even taken her Vyvanse today.  She is not on Sudafed or any Caffeine.  She denies any chest pain.  She denies any pleurisy or hemoptysis.  She denies any unilateral leg swelling.  There is no evidence of a DVT today on her physical exam.  She denies any syncope or near syncope.  Denies any recent weight loss or weight changes just of thyroid abnormality.  She denies feeling anxious or panic.  She is not in pain.  She does have bilateral ear pain and decreased hearing in her left ear.  On examination she does have an effusion behind her left tympanic membrane the right tympanic membrane is erythematous and dull suggesting an ear infection.  However I am very concerned about her tachycardia.  She does not appear dehydrated.  She denies any illicit drugs or stimulants.  At that time, my plan was: EKG today shows sinus tachycardia with normal intervals and a normal axis.  There is no evidence of cardiac ischemia.  Rhythm is definitely sinus.  Therefore this is not a cardiac apnea.  The question is why is the patient in sinus tachycardia.  She looks completely comfortable at rest and agrees.  She did not appear in pain or anxious.  Therefore I will check a CBC to rule out anemia.  I will check a TSH to evaluate for hyperthyroidism.  Patient clinically does not appear to be in any distress but I will check a D-dimer to evaluate for both pain possibility of a pulmonary embolism  given her recent COVID diagnosis.  I asked her to avoid any stimulants until we have an answer as to what is going on.  She does not appear clinically dehydrated.  I will treat the otitis media with amoxicillin 875 mg twice daily for 10 days.  Await results of the lab work required whether further work-up is necessary.  04/25/21 Patient states that she is still unable to hear out of her left ear.  There is no pain in her left ear.  On examination, the eardrum is no longer erythematous.  However there does appear to be a persistent effusion suggesting ongoing eustachian tube dysfunction.  She also continues to have tachycardia with a heart rate of 120 bpm and.  She also reports fatigue.  Her D-dimer was negative suggesting against a pulmonary embolism however her white blood cell count was elevated and her liver function tests were elevated.  I presume that this was due to the ear infection and residual inflammation from her recent viral infection. Past Medical History:  Diagnosis Date   Bartholin cyst    LEFT   Cervical dysplasia    Depression    Vaginal Pap smear, abnormal    Past Surgical History:  Procedure Laterality Date   CERVICAL BIOPSY  W/ LOOP ELECTRODE EXCISION  2012   CESAREAN SECTION     '05; '07   CESAREAN  SECTION N/A 02/10/2018   Procedure: REPEAT CESAREAN SECTION;  Surgeon: Everlene Farrier, MD;  Location: [facility];  Service: Obstetrics;  Laterality: N/A;  Repeat edc 02/14/18 NKDA Heather,  RNFA   CHOLECYSTECTOMY  2011   LAPAROSCOPIC REMOVAL RENAL CYST Left 12/12/2015   Procedure: XI ROBOTIC LEFT RENAL CYST DECORTICATION WITH LEFT NEPHROPEXY;  Surgeon: Alexis Frock, MD;  Location: [facility];  Service: Urology;  Laterality: Left;   ORIF CLAVICULAR FRACTURE  06/10/2011   Procedure: OPEN REDUCTION INTERNAL FIXATION (ORIF) CLAVICULAR FRACTURE;  Surgeon: Meredith Pel, MD;  Location: [facility];  Service: Orthopedics;  Laterality: Right;  ORIF Right clavicle   Current  Outpatient Medications on File Prior to Visit  Medication Sig Dispense Refill   amoxicillin (AMOXIL) 875 MG tablet Take 1 tablet (875 mg total) by mouth 2 (two) times daily for 10 days. 20 tablet 0   clonazePAM (KLONOPIN) 0.5 MG tablet Take 1 tablet (0.5 mg total) by mouth 3 (three) times daily as needed for anxiety. 60 tablet 0   fluticasone (FLONASE) 50 MCG/ACT nasal spray Place 2 sprays into both nostrils daily. 16 g 6   lisdexamfetamine (VYVANSE) 30 MG capsule Take 1 capsule (30 mg total) by mouth daily. 30 capsule 0   No current facility-administered medications on file prior to visit.   No Known Allergies Social History   Socioeconomic History   Marital status: Single    Spouse name: Not on file   Number of children: 2   Years of education: 52   Highest education level: Not on file  Occupational History   Occupation: Lawyer  Tobacco Use   Smoking status: Former    Packs/day: 0.25    Years: 3.00    Pack years: 0.75    Types: Cigarettes    Quit date: 04/21/2009    Years since quitting: 12.0   Smokeless tobacco: Never   Tobacco comments:    smoking was mostly social  Vaping Use   Vaping Use: Never used  Substance and Sexual Activity   Alcohol use: Yes    Comment: 1-3 glasses of wine a week.   Drug use: No   Sexual activity: Yes    Birth control/protection: Pill  Other Topics Concern   Not on file  Social History Narrative   Fun: Work and hang out with children   Denies abuse and feels safe at home.    Social Determinants of Health   Financial Resource Strain: Not on file  Food Insecurity: Not on file  Transportation Needs: Not on file  Physical Activity: Not on file  Stress: Not on file  Social Connections: Not on file  Intimate Partner Violence: Not on file     Review of Systems  All other systems reviewed and are negative.     Objective:   Physical Exam Vitals reviewed.  Constitutional:      General: She is not in acute distress.    Appearance:  Normal appearance. She is normal weight. She is not ill-appearing, toxic-appearing or diaphoretic.  HENT:     Head: Normocephalic and atraumatic.     Right Ear: Tympanic membrane is injected and erythematous.     Left Ear: A middle ear effusion is present. Tympanic membrane is injected and erythematous.  Cardiovascular:     Rate and Rhythm: Regular rhythm. Tachycardia present.     Heart sounds: Normal heart sounds. No murmur heard.   No friction rub. No gallop.  Pulmonary:     Effort: Pulmonary effort  is normal. No respiratory distress.     Breath sounds: Normal breath sounds. No wheezing or rales.  Musculoskeletal:     Right lower leg: No edema.     Left lower leg: No edema.  Skin:    Coloration: Skin is not jaundiced.     Findings: No bruising, erythema or rash.  Neurological:     General: No focal deficit present.     Mental Status: She is alert and oriented to person, place, and time.     Motor: No weakness.     Gait: Gait normal.  Psychiatric:        Mood and Affect: Mood normal.        Behavior: Behavior normal.        Thought Content: Thought content normal.        Judgment: Judgment normal.          Assessment & Plan:  Tachycardia - Plan: CBC with Differential/Platelet, COMPLETE METABOLIC PANEL WITH GFR  Elevated LFTs  Leukocytosis, unspecified type Patient has eustachian tube dysfunction.  I recommended trying a combination of Xyzal and Flonase to help open the eustachian tube this to facilitate drainage of the effusion from the left middle ear space.  I will consult ENT if persistent.  She also has persistent tachycardia.  I will recheck her white blood cell count and her liver function test.  If these have returned to normal, I would suggest trying the patient on Toprol and proceeding with an echocardiogram of the heart to evaluate for any damage secondary to the virus such as viral myocarditis.

## 2021-04-26 ENCOUNTER — Other Ambulatory Visit: Payer: Self-pay | Admitting: Family Medicine

## 2021-04-26 ENCOUNTER — Telehealth: Payer: Self-pay

## 2021-04-26 DIAGNOSIS — R7989 Other specified abnormal findings of blood chemistry: Secondary | ICD-10-CM

## 2021-04-26 DIAGNOSIS — R000 Tachycardia, unspecified: Secondary | ICD-10-CM

## 2021-04-26 LAB — COMPLETE METABOLIC PANEL WITHOUT GFR
AG Ratio: 1.3 (calc) (ref 1.0–2.5)
ALT: 87 U/L — ABNORMAL HIGH (ref 6–29)
AST: 68 U/L — ABNORMAL HIGH (ref 10–30)
Albumin: 4.5 g/dL (ref 3.6–5.1)
Alkaline phosphatase (APISO): 94 U/L (ref 31–125)
BUN: 9 mg/dL (ref 7–25)
CO2: 25 mmol/L (ref 20–32)
Calcium: 9.5 mg/dL (ref 8.6–10.2)
Chloride: 103 mmol/L (ref 98–110)
Creat: 0.6 mg/dL (ref 0.50–0.97)
Globulin: 3.4 g/dL (ref 1.9–3.7)
Glucose, Bld: 89 mg/dL (ref 65–99)
Potassium: 4.9 mmol/L (ref 3.5–5.3)
Sodium: 138 mmol/L (ref 135–146)
Total Bilirubin: 0.7 mg/dL (ref 0.2–1.2)
Total Protein: 7.9 g/dL (ref 6.1–8.1)
eGFR: 120 mL/min/{1.73_m2}

## 2021-04-26 LAB — CBC WITH DIFFERENTIAL/PLATELET
Absolute Monocytes: 615 cells/uL (ref 200–950)
Basophils Absolute: 74 cells/uL (ref 0–200)
Basophils Relative: 0.7 %
Eosinophils Absolute: 138 cells/uL (ref 15–500)
Eosinophils Relative: 1.3 %
HCT: 40.1 % (ref 35.0–45.0)
Hemoglobin: 14 g/dL (ref 11.7–15.5)
Lymphs Abs: 3742 cells/uL (ref 850–3900)
MCH: 31.4 pg (ref 27.0–33.0)
MCHC: 34.9 g/dL (ref 32.0–36.0)
MCV: 89.9 fL (ref 80.0–100.0)
MPV: 10.4 fL (ref 7.5–12.5)
Monocytes Relative: 5.8 %
Neutro Abs: 6031 cells/uL (ref 1500–7800)
Neutrophils Relative %: 56.9 %
Platelets: 395 10*3/uL (ref 140–400)
RBC: 4.46 10*6/uL (ref 3.80–5.10)
RDW: 11.8 % (ref 11.0–15.0)
Total Lymphocyte: 35.3 %
WBC: 10.6 10*3/uL (ref 3.8–10.8)

## 2021-04-26 LAB — HEPATITIS PANEL, ACUTE
Hep A IgM: NONREACTIVE
Hep B C IgM: NONREACTIVE
Hepatitis B Surface Ag: NONREACTIVE
Hepatitis C Ab: NONREACTIVE
SIGNAL TO CUT-OFF: 0.09

## 2021-04-26 LAB — TEST AUTHORIZATION

## 2021-04-26 MED ORDER — METOPROLOL SUCCINATE ER 25 MG PO TB24: 25.0000 mg | ORAL_TABLET | Freq: Every day | ORAL | 3 refills | Status: DC

## 2021-04-26 NOTE — Telephone Encounter (Signed)
-----   Message from Donita Brooks, MD sent at 04/26/2021  6:49 AM EST ----- WBC is back to normal which is good but liver function tests are still elevated.  I will schedule RUQ [place] to evaluate further  and ask the lab to add acute hepatitis panel to her labs from yesterday.  I would also add toprol xl 25 mg poqday for tachycardia and recheck heart rate via telephone in 1 week.

## 2021-04-26 NOTE — Telephone Encounter (Signed)
Spoke with patient regarding results.  Appointment scheduled and medication sent to pharmacy.

## 2021-05-02 ENCOUNTER — Telehealth: Payer: BC Managed Care – PPO | Admitting: Family Medicine

## 2021-05-02 DIAGNOSIS — R000 Tachycardia, unspecified: Secondary | ICD-10-CM

## 2021-05-02 DIAGNOSIS — U071 COVID-19: Secondary | ICD-10-CM

## 2021-05-02 DIAGNOSIS — R5383 Other fatigue: Secondary | ICD-10-CM

## 2021-05-02 NOTE — Progress Notes (Signed)
Subjective:    Patient ID: [name], female    DOB: 1986-04-06, 36 y.o.   MRN: [id number]  HPI Patient is being seen today as a video visit.  Video conference began at 1154.  Concluded at 1205.  Patient is currently at her office.  I am currently in my office.  She consents to be seen via video.  please see my last office visit with this patient.  In short, she had COVID in December.  After that she had an elevated white blood cell count, sinus tachycardia, and elevated liver function test.  Her white blood cell count had returned to normal after her last visit however she had persistent sinus tachycardia with a heart rate of 120 bpm and also had mild persistent hepatitis on her lab work.  She has an appointment for right upper quadrant ultrasound next week.  She is calling me today to give me an update on her heart rate.  She has been taking Toprol-XL 25 mg daily.  Her heart rate is in the upper 90s at night when she is relaxed.  During the day it is typically 110 bpm.  She denies any chest pain palpitation shortness of breath dyspnea on exertion or syncope.  She denies any dizziness or lightheadedness.  However she continues to have resting tachycardia which is not normal for the patient.  Otherwise she is asymptomatic.  She does report feeling fatigued ever since she started Toprol.  However once a day only at the same time she also discontinued Vyvanse due to tachycardia which I believe is compounding her fatigue. Past Medical History:  Diagnosis Date   Bartholin cyst    LEFT   Cervical dysplasia    Depression    Vaginal Pap smear, abnormal    Past Surgical History:  Procedure Laterality Date   CERVICAL BIOPSY  W/ LOOP ELECTRODE EXCISION  2012   CESAREAN SECTION     '05; '07   CESAREAN SECTION N/A 02/10/2018   Procedure: REPEAT CESAREAN SECTION;  Surgeon: Harold Hedge, MD;  Location: [facility];  Service: Obstetrics;  Laterality: N/A;  Repeat edc 02/14/18 NKDA Heather,   RNFA   CHOLECYSTECTOMY  2011   LAPAROSCOPIC REMOVAL RENAL CYST Left 12/12/2015   Procedure: XI ROBOTIC LEFT RENAL CYST DECORTICATION WITH LEFT NEPHROPEXY;  Surgeon: Sebastian Ache, MD;  Location: [facility];  Service: Urology;  Laterality: Left;   ORIF CLAVICULAR FRACTURE  06/10/2011   Procedure: OPEN REDUCTION INTERNAL FIXATION (ORIF) CLAVICULAR FRACTURE;  Surgeon: Cammy Copa, MD;  Location: [facility] OR;  Service: Orthopedics;  Laterality: Right;  ORIF Right clavicle   Current Outpatient Medications on File Prior to Visit  Medication Sig Dispense Refill   clonazePAM (KLONOPIN) 0.5 MG tablet Take 1 tablet (0.5 mg total) by mouth 3 (three) times daily as needed for anxiety. 60 tablet 0   fluticasone (FLONASE) 50 MCG/ACT nasal spray Place 2 sprays into both nostrils daily. 16 g 6   lisdexamfetamine (VYVANSE) 30 MG capsule Take 1 capsule (30 mg total) by mouth daily. 30 capsule 0   metoprolol succinate (TOPROL-XL) 25 MG 24 hr tablet Take 1 tablet (25 mg total) by mouth daily. 30 tablet 3   No current facility-administered medications on file prior to visit.   Marland Kitchenall Social History   Socioeconomic History   Marital status: Single    Spouse name: Not on file   Number of children: 2   Years of education: 41   Highest education level: Not  on file  Occupational History   Occupation: Lawyer  Tobacco Use   Smoking status: Former    Packs/day: 0.25    Years: 3.00    Pack years: 0.75    Types: Cigarettes    Quit date: 04/21/2009    Years since quitting: 12.0   Smokeless tobacco: Never   Tobacco comments:    smoking was mostly social  Vaping Use   Vaping Use: Never used  Substance and Sexual Activity   Alcohol use: Yes    Comment: 1-3 glasses of wine a week.   Drug use: No   Sexual activity: Yes    Birth control/protection: Pill  Other Topics Concern   Not on file  Social History Narrative   Fun: Work and hang out with children   Denies abuse and feels safe at home.    Social  Determinants of Health   Financial Resource Strain: Not on file  Food Insecurity: Not on file  Transportation Needs: Not on file  Physical Activity: Not on file  Stress: Not on file  Social Connections: Not on file  Intimate Partner Violence: Not on file      Review of Systems  All other systems reviewed and are negative.     Objective:   Physical Exam Constitutional:      General: She is not in acute distress.    Appearance: Normal appearance. She is not ill-appearing or toxic-appearing.  Cardiovascular:     Rate and Rhythm: Tachycardia present.  Neurological:     Mental Status: She is alert.          Assessment & Plan:  Tachycardia - Plan: ECHOCARDIOGRAM COMPLETE  Fatigue, unspecified type - Plan: ECHOCARDIOGRAM COMPLETE  COVID - Plan: ECHOCARDIOGRAM COMPLETE Patient is not anemic.  There is no evidence of hypothyroidism.  Her D-dimer was negative making a PE unlikely.  She is not dehydrated.  I am concerned that she may have suffered viral myocarditis or some type of cardiac insult from her recent COVID virus causing this persistent tachycardia and fatigue.  Therefore I will schedule the patient for an echocardiogram.  Continue Toprol for now

## 2021-05-10 ENCOUNTER — Other Ambulatory Visit: Payer: BLUE CROSS/BLUE SHIELD

## 2021-05-13 ENCOUNTER — Other Ambulatory Visit: Payer: Self-pay

## 2021-05-13 ENCOUNTER — Ambulatory Visit
Admission: RE | Admit: 2021-05-13 | Discharge: 2021-05-13 | Disposition: A | Discharge status: Home / Self Care | Payer: BC Managed Care – PPO | Source: Ambulatory Visit | Attending: Family Medicine | Admitting: Family Medicine

## 2021-05-13 DIAGNOSIS — R7989 Other specified abnormal findings of blood chemistry: Secondary | ICD-10-CM | POA: Diagnosis not present

## 2021-05-13 DIAGNOSIS — K760 Fatty (change of) liver, not elsewhere classified: Secondary | ICD-10-CM | POA: Diagnosis not present

## 2021-05-14 ENCOUNTER — Ambulatory Visit (HOSPITAL_COMMUNITY)
Admission: RE | Admit: 2021-05-14 | Discharge: 2021-05-14 | Disposition: A | Discharge status: Home / Self Care | Payer: BC Managed Care – PPO | Source: Ambulatory Visit | Attending: Family Medicine | Admitting: Family Medicine

## 2021-05-14 DIAGNOSIS — I77819 Aortic ectasia, unspecified site: Secondary | ICD-10-CM | POA: Insufficient documentation

## 2021-05-14 DIAGNOSIS — U071 COVID-19: Secondary | ICD-10-CM | POA: Insufficient documentation

## 2021-05-14 DIAGNOSIS — R5383 Other fatigue: Secondary | ICD-10-CM | POA: Diagnosis not present

## 2021-05-14 DIAGNOSIS — R000 Tachycardia, unspecified: Secondary | ICD-10-CM | POA: Diagnosis not present

## 2021-05-14 LAB — ECHOCARDIOGRAM COMPLETE
Area-P 1/2: 4.63 cm2
S' Lateral: 2.6 cm

## 2021-06-11 ENCOUNTER — Other Ambulatory Visit: Payer: Self-pay | Admitting: Family Medicine

## 2021-06-12 NOTE — Telephone Encounter (Signed)
LOV 04/25/21 Last refill: Vyvanse  04/16/21, #30, 0 refills Clonazepam  04/16/21, #60, 0 refills  Please review, thanks!

## 2021-06-13 MED ORDER — CLONAZEPAM 0.5 MG PO TABS: 0.5000 mg | ORAL_TABLET | Freq: Three times a day (TID) | ORAL | 0 refills | Status: DC | PRN

## 2021-06-13 MED ORDER — LISDEXAMFETAMINE DIMESYLATE 30 MG PO CAPS: 30.0000 mg | ORAL_CAPSULE | Freq: Every day | ORAL | 0 refills | Status: DC

## 2021-07-22 ENCOUNTER — Other Ambulatory Visit: Payer: Self-pay | Admitting: Family Medicine

## 2021-07-22 DIAGNOSIS — R000 Tachycardia, unspecified: Secondary | ICD-10-CM

## 2021-08-06 DIAGNOSIS — D069 Carcinoma in situ of cervix, unspecified: Secondary | ICD-10-CM | POA: Diagnosis not present

## 2021-08-06 DIAGNOSIS — Z9889 Other specified postprocedural states: Secondary | ICD-10-CM | POA: Diagnosis not present

## 2021-08-07 DIAGNOSIS — Z124 Encounter for screening for malignant neoplasm of cervix: Secondary | ICD-10-CM | POA: Diagnosis not present

## 2021-08-26 DIAGNOSIS — N750 Cyst of Bartholin's gland: Secondary | ICD-10-CM | POA: Diagnosis not present

## 2021-09-02 ENCOUNTER — Other Ambulatory Visit: Payer: Self-pay | Admitting: Family Medicine

## 2021-09-02 MED ORDER — LISDEXAMFETAMINE DIMESYLATE 30 MG PO CAPS: 30.0000 mg | ORAL_CAPSULE | Freq: Every day | ORAL | 0 refills | Status: DC

## 2021-09-02 MED ORDER — CLONAZEPAM 0.5 MG PO TABS: 0.5000 mg | ORAL_TABLET | Freq: Three times a day (TID) | ORAL | 0 refills | Status: DC | PRN

## 2021-09-02 NOTE — Telephone Encounter (Signed)
LOV 04/25/21 ? ?Klonopin ?Last refill 06/13/21, #30, 0 refills ? ?Vyvanse ?Last refill 06/13/21, #60, 0 refills ? ?Please review, thanks! ? ?

## 2021-09-04 DIAGNOSIS — N750 Cyst of Bartholin's gland: Secondary | ICD-10-CM | POA: Diagnosis not present

## 2021-11-17 ENCOUNTER — Other Ambulatory Visit: Payer: Self-pay | Admitting: Family Medicine

## 2021-11-19 ENCOUNTER — Other Ambulatory Visit: Payer: Self-pay | Admitting: Family Medicine

## 2021-11-19 MED ORDER — CLONAZEPAM 0.5 MG PO TABS: 0.5000 mg | ORAL_TABLET | Freq: Three times a day (TID) | ORAL | 0 refills | Status: DC | PRN

## 2021-11-19 MED ORDER — LISDEXAMFETAMINE DIMESYLATE 30 MG PO CAPS: 30.0000 mg | ORAL_CAPSULE | Freq: Every day | ORAL | 0 refills | Status: DC

## 2022-01-29 DIAGNOSIS — Z124 Encounter for screening for malignant neoplasm of cervix: Secondary | ICD-10-CM | POA: Diagnosis not present

## 2022-01-29 DIAGNOSIS — Z6829 Body mass index (BMI) 29.0-29.9, adult: Secondary | ICD-10-CM | POA: Diagnosis not present

## 2022-01-29 DIAGNOSIS — Z01419 Encounter for gynecological examination (general) (routine) without abnormal findings: Secondary | ICD-10-CM | POA: Diagnosis not present

## 2022-04-28 ENCOUNTER — Other Ambulatory Visit: Payer: Self-pay | Admitting: Family Medicine

## 2022-04-28 MED ORDER — CLONAZEPAM 0.5 MG PO TABS: 0.5000 mg | ORAL_TABLET | Freq: Three times a day (TID) | ORAL | 0 refills | Status: DC | PRN

## 2022-04-28 MED ORDER — LISDEXAMFETAMINE DIMESYLATE 30 MG PO CAPS
30.0000 mg | ORAL_CAPSULE | Freq: Every day | ORAL | 0 refills | Status: DC
Start: 2022-04-28 — End: 2022-08-26

## 2022-05-02 ENCOUNTER — Telehealth: Payer: Self-pay

## 2022-05-02 NOTE — Telephone Encounter (Signed)
Received fax from [place] and pt's medication, Lisdexamfetamine is on back order. Is there an alternative? Thanks.

## 2022-05-05 ENCOUNTER — Other Ambulatory Visit: Payer: Self-pay | Admitting: Family Medicine

## 2022-05-05 MED ORDER — AMPHETAMINE-DEXTROAMPHET ER 20 MG PO CP24: 20.0000 mg | ORAL_CAPSULE | ORAL | 0 refills | Status: DC

## 2022-06-24 DIAGNOSIS — Z124 Encounter for screening for malignant neoplasm of cervix: Secondary | ICD-10-CM | POA: Diagnosis not present

## 2022-06-24 DIAGNOSIS — R87610 Atypical squamous cells of undetermined significance on cytologic smear of cervix (ASC-US): Secondary | ICD-10-CM | POA: Diagnosis not present

## 2022-06-24 DIAGNOSIS — N879 Dysplasia of cervix uteri, unspecified: Secondary | ICD-10-CM | POA: Diagnosis not present

## 2022-07-02 DIAGNOSIS — N879 Dysplasia of cervix uteri, unspecified: Secondary | ICD-10-CM | POA: Diagnosis not present

## 2022-07-02 DIAGNOSIS — R87611 Atypical squamous cells cannot exclude high grade squamous intraepithelial lesion on cytologic smear of cervix (ASC-H): Secondary | ICD-10-CM | POA: Diagnosis not present

## 2022-07-17 DIAGNOSIS — N760 Acute vaginitis: Secondary | ICD-10-CM | POA: Diagnosis not present

## 2022-08-26 ENCOUNTER — Encounter: Payer: Self-pay | Admitting: Family Medicine

## 2022-08-26 ENCOUNTER — Ambulatory Visit (INDEPENDENT_AMBULATORY_CARE_PROVIDER_SITE_OTHER): Payer: BC Managed Care – PPO | Admitting: Family Medicine

## 2022-08-26 VITALS — BP 120/74 | HR 84 | Temp 98.7°F | Ht 62.0 in | Wt 157.4 lb

## 2022-08-26 DIAGNOSIS — Z0001 Encounter for general adult medical examination with abnormal findings: Secondary | ICD-10-CM

## 2022-08-26 DIAGNOSIS — Z13220 Encounter for screening for lipoid disorders: Secondary | ICD-10-CM | POA: Diagnosis not present

## 2022-08-26 DIAGNOSIS — K760 Fatty (change of) liver, not elsewhere classified: Secondary | ICD-10-CM | POA: Diagnosis not present

## 2022-08-26 DIAGNOSIS — R7989 Other specified abnormal findings of blood chemistry: Secondary | ICD-10-CM

## 2022-08-26 DIAGNOSIS — R635 Abnormal weight gain: Secondary | ICD-10-CM

## 2022-08-26 DIAGNOSIS — Z0000 Encounter for general adult medical examination without abnormal findings: Secondary | ICD-10-CM

## 2022-08-26 NOTE — Progress Notes (Signed)
Subjective:    Patient ID: [name], female    DOB: [birth year], 37 y.o.   MRN: [id number]  Patient is a very pleasant 37 year old Caucasian female here today for complete physical exam.  Labs, the patient was found to have elevated liver function test.  At that time she had a negative viral hepatitis serology panel.  She had a right upper quadrant ultrasound that showed no significant abnormality.  The hypothesis was that the patient may have fatty liver disease.  Since that time, she has stopped all medication.  She is exercising on a daily basis.  She is eating a plant-based diet and consuming a lot of fish.  She is here today to repeat this fasting lab work.  However she is concerned because she is having a difficult time losing weight.  She is concerned that she may have insulin resistance due to her fatty liver disease.  She is also concerned that she may have issues with her thyroid.  She has a history of abnormal Pap smears.  Her Pap smear performed by her gynecologist.  She does not yet require colonoscopy or mammogram. Past Medical History:  Diagnosis Date   Bartholin cyst    LEFT   Cervical dysplasia    Depression    Vaginal Pap smear, abnormal    Past Surgical History:  Procedure Laterality Date   CERVICAL BIOPSY  W/ LOOP ELECTRODE EXCISION  2012   CESAREAN SECTION     '05; '07   CESAREAN SECTION N/A 02/10/2018   Procedure: REPEAT CESAREAN SECTION;  Surgeon: Harold Hedge, MD;  Location: [facility] BIRTHING SUITES;  Service: Obstetrics;  Laterality: N/A;  Repeat edc 02/14/18 NKDA Heather,  RNFA   CHOLECYSTECTOMY  2011   LAPAROSCOPIC REMOVAL RENAL CYST Left 12/12/2015   Procedure: XI ROBOTIC LEFT RENAL CYST DECORTICATION WITH LEFT NEPHROPEXY;  Surgeon: Sebastian Ache, MD;  Location: [facility];  Service: Urology;  Laterality: Left;   ORIF CLAVICULAR FRACTURE  06/10/2011   Procedure: OPEN REDUCTION INTERNAL FIXATION (ORIF) CLAVICULAR FRACTURE;  Surgeon: Cammy Copa, MD;  Location:  [facility];  Service: Orthopedics;  Laterality: Right;  ORIF Right clavicle   Current Outpatient Medications on File Prior to Visit  Medication Sig Dispense Refill   amphetamine-dextroamphetamine (ADDERALL XR) 20 MG 24 hr capsule Take 1 capsule (20 mg total) by mouth every morning. In place of vyvanse 30 capsule 0   clonazePAM (KLONOPIN) 0.5 MG tablet Take 1 tablet (0.5 mg total) by mouth 3 (three) times daily as needed for anxiety. 60 tablet 0   fluticasone (FLONASE) 50 MCG/ACT nasal spray Place 2 sprays into both nostrils daily. 16 g 6   lisdexamfetamine (VYVANSE) 30 MG capsule Take 1 capsule (30 mg total) by mouth daily. 30 capsule 0   metoprolol succinate (TOPROL-XL) 25 MG 24 hr tablet TAKE 1 TABLET (25 MG TOTAL) BY MOUTH DAILY. 90 tablet 1   No current facility-administered medications on file prior to visit.  Patient has stopped all medication above 3 months ago No Known Allergies Social History   Socioeconomic History   Marital status: Single    Spouse name: Not on file   Number of children: 2   Years of education: 87   Highest education level: Not on file  Occupational History   Occupation: Lawyer  Tobacco Use   Smoking status: Former    Packs/day: 0.25    Years: 3.00    Additional pack years: 0.00    Total pack years: 0.75  Types: Cigarettes    Quit date: 04/21/2009    Years since quitting: 13.3   Smokeless tobacco: Never   Tobacco comments:    smoking was mostly social  Vaping Use   Vaping Use: Never used  Substance and Sexual Activity   Alcohol use: Yes    Comment: 1-3 glasses of wine a week.   Drug use: No   Sexual activity: Yes    Birth control/protection: Pill  Other Topics Concern   Not on file  Social History Narrative   Fun: Work and hang out with children   Denies abuse and feels safe at home.    Social Determinants of Health   Financial Resource Strain: Low Risk  (01/28/2018)   Overall Financial Resource Strain (CARDIA)    Difficulty of Paying Living  Expenses: Not hard at all  Food Insecurity: No Food Insecurity (01/28/2018)   Hunger Vital Sign    Worried About Running Out of Food in the Last Year: Never true    Ran Out of Food in the Last Year: Never true  Transportation Needs: Unknown (01/28/2018)   PRAPARE - Administrator, Civil Service (Medical): No    Lack of Transportation (Non-Medical): Not on file  Physical Activity: Not on file  Stress: No Stress Concern Present (01/28/2018)   [employer] of Occupational Health - Occupational Stress Questionnaire    Feeling of Stress : Only a little  Social Connections: Not on file  Intimate Partner Violence: Not At Risk (01/28/2018)   Humiliation, Afraid, Rape, and Kick questionnaire    Fear of Current or Ex-Partner: No    Emotionally Abused: No    Physically Abused: No    Sexually Abused: No     Review of Systems  All other systems reviewed and are negative.      Objective:   Physical Exam Vitals reviewed.  Constitutional:      General: She is not in acute distress.    Appearance: Normal appearance. She is normal weight. She is not ill-appearing, toxic-appearing or diaphoretic.  HENT:     Head: Normocephalic and atraumatic.     Right Ear: Hearing and tympanic membrane normal.     Left Ear: Hearing and tympanic membrane normal.     Nose: No congestion or rhinorrhea.     Mouth/Throat:     Pharynx: No oropharyngeal exudate or posterior oropharyngeal erythema.  Eyes:     Extraocular Movements: Extraocular movements intact.     Conjunctiva/sclera: Conjunctivae normal.     Pupils: Pupils are equal, round, and reactive to light.  Neck:     Vascular: No carotid bruit.  Cardiovascular:     Rate and Rhythm: Regular rhythm. Tachycardia present.     Heart sounds: Normal heart sounds. No murmur heard.    No friction rub. No gallop.  Pulmonary:     Effort: Pulmonary effort is normal. No respiratory distress.     Breath sounds: Normal breath sounds. No wheezing  or rales.  Abdominal:     General: Abdomen is flat. Bowel sounds are normal. There is no distension.     Palpations: Abdomen is soft.     Tenderness: There is no abdominal tenderness. There is no guarding or rebound.  Musculoskeletal:     Cervical back: Normal range of motion and neck supple.     Right lower leg: No edema.     Left lower leg: No edema.  Lymphadenopathy:     Cervical: No cervical adenopathy.  Skin:  Coloration: Skin is not jaundiced.     Findings: No bruising, erythema or rash.  Neurological:     General: No focal deficit present.     Mental Status: She is alert and oriented to person, place, and time. Mental status is at baseline.     Motor: No weakness.     Gait: Gait normal.  Psychiatric:        Mood and Affect: Mood normal.        Behavior: Behavior normal.        Thought Content: Thought content normal.        Judgment: Judgment normal.           Assessment & Plan:  Weight gain - Plan: CBC with Differential/Platelet, COMPLETE METABOLIC PANEL WITH GFR, TSH, Insulin, Free (Bioactive)  Fatty liver disease, nonalcoholic - Plan: TSH, Insulin, Free (Bioactive)  Screening cholesterol level - Plan: CBC with Differential/Platelet, Lipid panel, COMPLETE METABOLIC PANEL WITH GFR  Elevated LFTs  General medical exam I am very proud of the patient for making lifestyle changes to try to address her fatty liver disease.  I will check a CBC a CMP and a cholesterol panel today.  I will also check a TSH given the weight gain and an insulin level given her history of fatty liver disease to check for insulin resistance.  If her insulin level is elevated and her liver function tests remain elevated, may consider trying Ozempic for fatty liver disease and to help with weight loss and her insulin resistance.  Defer Pap smear to her gynecologist

## 2022-09-05 LAB — CBC WITH DIFFERENTIAL/PLATELET
Absolute Monocytes: 428 cells/uL (ref 200–950)
Basophils Absolute: 59 cells/uL (ref 0–200)
Basophils Relative: 0.7 %
Eosinophils Absolute: 101 cells/uL (ref 15–500)
Eosinophils Relative: 1.2 %
HCT: 39.5 % (ref 35.0–45.0)
Hemoglobin: 13.4 g/dL (ref 11.7–15.5)
Lymphs Abs: 3108 cells/uL (ref 850–3900)
MCH: 30.8 pg (ref 27.0–33.0)
MCHC: 33.9 g/dL (ref 32.0–36.0)
MCV: 90.8 fL (ref 80.0–100.0)
MPV: 11.4 fL (ref 7.5–12.5)
Monocytes Relative: 5.1 %
Neutro Abs: 4704 cells/uL (ref 1500–7800)
Neutrophils Relative %: 56 %
Platelets: 312 10*3/uL (ref 140–400)
RBC: 4.35 10*6/uL (ref 3.80–5.10)
RDW: 11.6 % (ref 11.0–15.0)
Total Lymphocyte: 37 %
WBC: 8.4 10*3/uL (ref 3.8–10.8)

## 2022-09-05 LAB — LIPID PANEL
Cholesterol: 176 mg/dL (ref <200)
HDL: 38 mg/dL — ABNORMAL LOW (ref >=50)
Non-HDL Cholesterol (Calc): 138 mg/dL (calc) — ABNORMAL HIGH (ref <130)
Total CHOL/HDL Ratio: 4.6 (calc) (ref <5.0)
Triglycerides: 534 mg/dL — ABNORMAL HIGH (ref <150)

## 2022-09-05 LAB — COMPLETE METABOLIC PANEL WITH GFR
AG Ratio: 1.3 (calc) (ref 1.0–2.5)
ALT: 56 U/L — ABNORMAL HIGH (ref 6–29)
AST: 38 U/L — ABNORMAL HIGH (ref 10–30)
Albumin: 4.3 g/dL (ref 3.6–5.1)
Alkaline phosphatase (APISO): 80 U/L (ref 31–125)
BUN: 12 mg/dL (ref 7–25)
CO2: 20 mmol/L (ref 20–32)
Calcium: 9.2 mg/dL (ref 8.6–10.2)
Chloride: 103 mmol/L (ref 98–110)
Creat: 0.67 mg/dL (ref 0.50–0.97)
Globulin: 3.2 g/dL (calc) (ref 1.9–3.7)
Glucose, Bld: 92 mg/dL (ref 65–99)
Potassium: 4.4 mmol/L (ref 3.5–5.3)
Sodium: 138 mmol/L (ref 135–146)
Total Bilirubin: 0.6 mg/dL (ref 0.2–1.2)
Total Protein: 7.5 g/dL (ref 6.1–8.1)
eGFR: 116 mL/min/{1.73_m2} (ref >=60)

## 2022-09-05 LAB — INSULIN, FREE (BIOACTIVE): Insulin, Free: 6.4 u[IU]/mL (ref 1.5–14.9)

## 2022-09-05 LAB — TSH: TSH: 2.29 mIU/L

## 2022-09-09 ENCOUNTER — Telehealth: Payer: Self-pay

## 2022-09-09 ENCOUNTER — Other Ambulatory Visit: Payer: Self-pay

## 2022-09-09 DIAGNOSIS — E782 Mixed hyperlipidemia: Secondary | ICD-10-CM

## 2022-09-09 MED ORDER — OMEGA-3-ACID ETHYL ESTERS 1 G PO CAPS: 2.0000 g | ORAL_CAPSULE | Freq: Every day | ORAL | 3 refills | Status: DC

## 2022-09-09 NOTE — Telephone Encounter (Signed)
MY CHART MESSAGE FROM PATIENT:  I think I've had a change of heart about the Ozempic. I looked into the side effects and also saw tons of people say they had to stay on it once they started taking it. Is it too late to cancel the prescription? I'll try the other pill for high triglycerides if Dr. Tanya Nones thinks I should.  I sent in Rx for Lovaza that you recommended from pt's lab note. Thanks.

## 2022-09-18 DIAGNOSIS — E782 Mixed hyperlipidemia: Secondary | ICD-10-CM | POA: Insufficient documentation

## 2022-10-13 ENCOUNTER — Ambulatory Visit
Admission: RE | Admit: 2022-10-13 | Discharge: 2022-10-13 | Disposition: A | Discharge status: Home / Self Care | Payer: BC Managed Care – PPO | Source: Ambulatory Visit | Attending: Family Medicine | Admitting: Family Medicine

## 2022-10-13 VITALS — BP 135/86 | HR 83 | Temp 98.3°F | Resp 15

## 2022-10-13 DIAGNOSIS — R599 Enlarged lymph nodes, unspecified: Secondary | ICD-10-CM | POA: Diagnosis not present

## 2022-10-13 NOTE — ED Provider Notes (Signed)
[facility]    CSN: [id number] Arrival date & time: 10/13/22  1321      History   Chief Complaint Chief Complaint  Patient presents with   Neck Pain    Entered by patient    HPI [name] is a 37 y.o. female.  Noticed right posterior neck pain on 10/09/2022.  She discovered a lump on the back of her neck .  She has been rubbing it because she thought it was muscle soreness.  Since first occurred, patient states it is much more painful and she feels like it is bigger. Pt states she has taken ibuprofen for little relief. Pt states she did not injure her neck but reports it hurts to move her neck.  Denies scalp wound or infection or any problems with the skin of her head     Neck Pain   Past Medical History:  Diagnosis Date   Bartholin cyst    LEFT   Cervical dysplasia    Depression    Vaginal Pap smear, abnormal     Patient Active Problem List   Diagnosis Date Noted   Elevated triglycerides with high cholesterol 09/18/2022   H/O cesarean section 02/10/2018   Renal cyst, left 12/12/2015   Renal mass 12/06/2015   Lung nodule 12/06/2015   Benzodiazepine overdose 03/30/2014   Depression 03/30/2014   Head injury    Suicide attempt [facility])    Drug overdose, intentional (HCC) 03/29/2014   Contraception management 01/18/2014   Hx of abnormal cervical Pap smear 01/18/2014    Past Surgical History:  Procedure Laterality Date   CERVICAL BIOPSY  W/ LOOP ELECTRODE EXCISION  2012   CESAREAN SECTION     '05; '07   CESAREAN SECTION N/A 02/10/2018   Procedure: REPEAT CESAREAN SECTION;  Surgeon: Harold Hedge, MD;  Location: [facility] BIRTHING SUITES;  Service: Obstetrics;  Laterality: N/A;  Repeat edc 02/14/18 NKDA Heather,  RNFA   CHOLECYSTECTOMY  2011   LAPAROSCOPIC REMOVAL RENAL CYST Left 12/12/2015   Procedure: XI ROBOTIC LEFT RENAL CYST DECORTICATION WITH LEFT NEPHROPEXY;  Surgeon: Sebastian Ache, MD;  Location: [facility];  Service: Urology;  Laterality: Left;    ORIF CLAVICULAR FRACTURE  06/10/2011   Procedure: OPEN REDUCTION INTERNAL FIXATION (ORIF) CLAVICULAR FRACTURE;  Surgeon: Cammy Copa, MD;  Location: [facility] OR;  Service: Orthopedics;  Laterality: Right;  ORIF Right clavicle    OB History     Gravida  3   Para  3   Term  2   Preterm      AB      Living  3      SAB      IAB      Ectopic      Multiple  0   Live Births  3            Home Medications    Prior to Admission medications   Medication Sig Start Date End Date Taking? Authorizing Provider  omega-3 acid ethyl esters (LOVAZA) 1 g capsule Take 2 capsules (2 g total) by mouth daily. 09/09/22   Donita Brooks, MD    Family History Family History  Problem Relation Age of Onset   Healthy Mother    Diabetes Father    Cancer Paternal Grandmother        CERVICAL   Prostate cancer Paternal Grandfather     Social History Social History   Tobacco Use   Smoking status: Former    Packs/day:  0.25    Years: 3.00    Additional pack years: 0.00    Total pack years: 0.75    Types: Cigarettes    Quit date: 04/21/2009    Years since quitting: 13.4   Smokeless tobacco: Never   Tobacco comments:    smoking was mostly social  Vaping Use   Vaping Use: Never used  Substance Use Topics   Alcohol use: Yes    Comment: 1-3 glasses of wine a week.   Drug use: No     Allergies   Patient has no known allergies.   Review of Systems Review of Systems   Physical Exam Triage Vital Signs ED Triage Vitals  Enc Vitals Group     BP 10/13/22 1343 135/86     Pulse Rate 10/13/22 1343 83     Resp 10/13/22 1343 15     Temp 10/13/22 1343 98.3 F (36.8 C)     Temp Source 10/13/22 1343 Oral     SpO2 10/13/22 1343 98 %     Weight --      Height --      Head Circumference --      Peak Flow --      Pain Score 10/13/22 1342 7     Pain Loc --      Pain Edu? --      Excl. in GC? --    No data found.  Updated Vital Signs BP 135/86 (BP Location: Left Arm)    Pulse 83   Temp 98.3 F (36.8 C) (Oral)   Resp 15   LMP 10/01/2022 (Exact Date)   SpO2 98%   Visual Acuity Right Eye Distance:   Left Eye Distance:   Bilateral Distance:    Right Eye Near:   Left Eye Near:    Bilateral Near:     Physical Exam Constitutional:      Appearance: Normal appearance. She is not ill-appearing.  Neck:   Musculoskeletal:     Cervical back: No edema, erythema, rigidity, spasms or bony tenderness. Normal range of motion.     Comments: No inflamed muscle or muscle tenderness in posterior neck  Neurological:     Mental Status: She is alert.      UC Treatments / Results  Labs (all labs ordered are listed, but only abnormal results are displayed) Labs Reviewed - No data to display  EKG   Radiology No results found.  Procedures Procedures (including critical care time)  Medications Ordered in UC Medications - No data to display  Initial Impression / Assessment and Plan / UC Course  I have reviewed the triage vital signs and the nursing notes.  Pertinent labs & imaging results that were available during my care of the patient were reviewed by me and considered in my medical decision making (see chart for details).    Not sure what this 1 lymph node is reactive.  I do not see any signs of rash or infection in her scalp.  Patient does have dandruff.  Patient will treat using supportive measures and monitor.  She will seek further care if something worsens or does not improve  Final Clinical Impressions(s) / UC Diagnoses   Final diagnoses:  Enlarged lymph nodes     Discharge Instructions      Use heat therapy and ibuprofen to help relieve the lymph node pain. Seek further care if you identify a wound or skin infection that could be causing it. It should go away with time.  If it doesn't you can have your PCP recheck it.    ED Prescriptions   None    PDMP not reviewed this encounter.   Cathlyn Parsons, NP 10/13/22 [phone]

## 2022-10-13 NOTE — ED Triage Notes (Signed)
Pt presents with c/o a lump on the back of her neck X 4 days.   Pt states she has taken ibuprofen. Pt states she did not injury her neck and reports it hurts to move her neck.

## 2022-10-13 NOTE — Discharge Instructions (Signed)
Use heat therapy and ibuprofen to help relieve the lymph node pain. Seek further care if you identify a wound or skin infection that could be causing it. It should go away with time. If it doesn't you can have your PCP recheck it.

## 2022-11-26 ENCOUNTER — Other Ambulatory Visit: Payer: BC Managed Care – PPO

## 2022-11-26 DIAGNOSIS — E782 Mixed hyperlipidemia: Secondary | ICD-10-CM | POA: Diagnosis not present

## 2022-11-26 LAB — LIPID PANEL
Cholesterol: 171 mg/dL (ref <200)
HDL: 33 mg/dL — ABNORMAL LOW (ref >=50)
Non-HDL Cholesterol (Calc): 138 mg/dL (calc) — ABNORMAL HIGH (ref <130)
Total CHOL/HDL Ratio: 5.2 (calc) — ABNORMAL HIGH (ref <5.0)
Triglycerides: 630 mg/dL — ABNORMAL HIGH (ref <150)

## 2022-11-27 ENCOUNTER — Other Ambulatory Visit: Payer: Self-pay

## 2022-11-27 DIAGNOSIS — E782 Mixed hyperlipidemia: Secondary | ICD-10-CM

## 2022-11-27 MED ORDER — FENOFIBRATE 145 MG PO TABS: 145.0000 mg | ORAL_TABLET | Freq: Every day | ORAL | 3 refills | Status: DC

## 2022-12-23 DIAGNOSIS — Z713 Dietary counseling and surveillance: Secondary | ICD-10-CM | POA: Diagnosis not present

## 2022-12-26 ENCOUNTER — Ambulatory Visit: Payer: BC Managed Care – PPO | Admitting: Family Medicine

## 2022-12-26 VITALS — BP 112/72 | HR 93 | Temp 98.6°F | Ht 63.0 in | Wt 150.0 lb

## 2022-12-26 DIAGNOSIS — M5431 Sciatica, right side: Secondary | ICD-10-CM

## 2022-12-26 DIAGNOSIS — G8929 Other chronic pain: Secondary | ICD-10-CM

## 2022-12-26 DIAGNOSIS — M5441 Lumbago with sciatica, right side: Secondary | ICD-10-CM

## 2022-12-26 MED ORDER — PREDNISONE 20 MG PO TABS: ORAL_TABLET | ORAL | 0 refills | Status: DC

## 2022-12-26 MED ORDER — TRAMADOL HCL 50 MG PO TABS: 100.0000 mg | ORAL_TABLET | Freq: Four times a day (QID) | ORAL | 0 refills | Status: AC | PRN

## 2022-12-26 NOTE — Progress Notes (Signed)
Subjective:    Patient ID: [name], female    DOB: 1985-05-15, 37 y.o.   MRN: [id number] Patient is a very pleasant 37 year old Caucasian female who presents today reporting sciatica.  She has been dealing with right-sided sciatic nerve pain since January, 9 months.  The pain originates in her right lower back just above her gluteus.  It radiates down her posterior right hamstring all the way into her right foot.  The pain is an aching throbbing pain that is constant.  Her right foot is numb.  The pain has intensified over the last 3 weeks.  Today her right foot is numb on exam.  She has tried numerous conservative therapies.  She has tried NSAIDs over-the-counter without relief.  She has been trying a variety of stretches and exercises that she is seen on YouTube without any relief.  She denies any bowel or bladder incontinence.  She denies any saddle anesthesia. Past Medical History:  Diagnosis Date   Bartholin cyst    LEFT   Cervical dysplasia    Depression    Vaginal Pap smear, abnormal    Past Surgical History:  Procedure Laterality Date   CERVICAL BIOPSY  W/ LOOP ELECTRODE EXCISION  2012   CESAREAN SECTION     '05; '07   CESAREAN SECTION N/A 02/10/2018   Procedure: REPEAT CESAREAN SECTION;  Surgeon: Harold Hedge, MD;  Location: [facility] BIRTHING SUITES;  Service: Obstetrics;  Laterality: N/A;  Repeat edc 02/14/18 NKDA Heather,  RNFA   CHOLECYSTECTOMY  2011   LAPAROSCOPIC REMOVAL RENAL CYST Left 12/12/2015   Procedure: XI ROBOTIC LEFT RENAL CYST DECORTICATION WITH LEFT NEPHROPEXY;  Surgeon: Sebastian Ache, MD;  Location: [facility];  Service: Urology;  Laterality: Left;   ORIF CLAVICULAR FRACTURE  06/10/2011   Procedure: OPEN REDUCTION INTERNAL FIXATION (ORIF) CLAVICULAR FRACTURE;  Surgeon: Cammy Copa, MD;  Location: [facility] OR;  Service: Orthopedics;  Laterality: Right;  ORIF Right clavicle   Current Outpatient Medications on File Prior to Visit  Medication Sig Dispense Refill    fenofibrate (TRICOR) 145 MG tablet Take 1 tablet (145 mg total) by mouth daily. 30 tablet 3   No current facility-administered medications on file prior to visit.  Patient has stopped all medication above 3 months ago No Known Allergies Social History   Socioeconomic History   Marital status: Single    Spouse name: Not on file   Number of children: 2   Years of education: 39   Highest education level: Professional school degree (e.g., MD, DDS, DVM, JD)  Occupational History   Occupation: Lawyer  Tobacco Use   Smoking status: Former    Current packs/day: 0.00    Average packs/day: 0.3 packs/day for 3.0 years (0.8 ttl pk-yrs)    Types: Cigarettes    Start date: 04/21/2006    Quit date: 04/21/2009    Years since quitting: 13.6   Smokeless tobacco: Never   Tobacco comments:    smoking was mostly social  Vaping Use   Vaping status: Never Used  Substance and Sexual Activity   Alcohol use: Yes    Comment: 1-3 glasses of wine a week.   Drug use: No   Sexual activity: Yes    Birth control/protection: Pill  Other Topics Concern   Not on file  Social History Narrative   Fun: Work and hang out with children   Denies abuse and feels safe at home.    Social Determinants of Health   Financial Resource Strain:  Low Risk  (12/24/2022)   Overall Financial Resource Strain (CARDIA)    Difficulty of Paying Living Expenses: Not hard at all  Food Insecurity: No Food Insecurity (12/24/2022)   Hunger Vital Sign    Worried About Running Out of Food in the Last Year: Never true    Ran Out of Food in the Last Year: Never true  Transportation Needs: No Transportation Needs (12/24/2022)   PRAPARE - Administrator, Civil Service (Medical): No    Lack of Transportation (Non-Medical): No  Physical Activity: Sufficiently Active (12/24/2022)   Exercise Vital Sign    Days of Exercise per Week: 7 days    Minutes of Exercise per Session: 30 min  Stress: Stress Concern Present (12/24/2022)   [employer] of Occupational Health - Occupational Stress Questionnaire    Feeling of Stress : Rather much  Social Connections: Moderately Integrated (12/24/2022)   Social Connection and Isolation Panel [NHANES]    Frequency of Communication with Friends and Family: More than three times a week    Frequency of Social Gatherings with Friends and Family: More than three times a week    Attends Religious Services: More than 4 times per year    Active Member of [employer] or Organizations: Yes    Attends Banker Meetings: More than 4 times per year    Marital Status: Never married  Intimate Partner Violence: Unknown (07/23/2021)   Received from [facility]   HITS    Physically Hurt: Not on file    Insult or Talk Down To: Not on file    Threaten Physical Harm: Not on file    Scream or Curse: Not on file     Review of Systems  All other systems reviewed and are negative.      Objective:   Physical Exam Vitals reviewed.  Constitutional:      General: She is not in acute distress.    Appearance: Normal appearance. She is normal weight. She is not ill-appearing, toxic-appearing or diaphoretic.  HENT:     Head: Normocephalic and atraumatic.     Right Ear: Hearing normal.     Left Ear: Hearing normal.  Neck:     Vascular: No carotid bruit.  Cardiovascular:     Rate and Rhythm: Normal rate and regular rhythm.     Heart sounds: Normal heart sounds. No murmur heard.    No friction rub. No gallop.  Pulmonary:     Effort: Pulmonary effort is normal. No respiratory distress.     Breath sounds: Normal breath sounds. No wheezing or rales.  Musculoskeletal:     Cervical back: Normal range of motion and neck supple.     Lumbar back: Tenderness present. Decreased range of motion.       Back:       Legs:  Lymphadenopathy:     Cervical: No cervical adenopathy.  Neurological:     General: No focal deficit present.     Mental Status: She is alert and oriented to person, place, and  time. Mental status is at baseline.     Motor: No weakness.     Gait: Gait normal.  Psychiatric:        Mood and Affect: Mood normal.        Behavior: Behavior normal.        Thought Content: Thought content normal.        Judgment: Judgment normal.  Assessment & Plan:  Right sided sciatica - Plan: DG Lumbar Spine Complete, MR Lumbar Spine Wo Contrast  Chronic right-sided low back pain with right-sided sciatica Symptoms have been present for 9 months.  She has tried NSAIDs and exercises and stretching without benefit.  Symptoms of worsened.  Begin a prednisone taper pack and use tramadol 50 mg every 8 hours as needed for pain.  Start by obtaining an x-ray of the lumbar spine and if no abnormalities are seen, proceed with an MRI of the lumbar spine to evaluate for nerve impingement and right-sided radiculopathy

## 2022-12-29 ENCOUNTER — Ambulatory Visit
Admission: RE | Admit: 2022-12-29 | Discharge: 2022-12-29 | Disposition: A | Discharge status: Home / Self Care | Payer: BC Managed Care – PPO | Source: Ambulatory Visit | Attending: Family Medicine | Admitting: Family Medicine

## 2022-12-29 ENCOUNTER — Ambulatory Visit: Payer: BC Managed Care – PPO | Admitting: Family Medicine

## 2022-12-29 DIAGNOSIS — M5441 Lumbago with sciatica, right side: Secondary | ICD-10-CM | POA: Diagnosis not present

## 2022-12-29 DIAGNOSIS — M5431 Sciatica, right side: Secondary | ICD-10-CM

## 2022-12-30 ENCOUNTER — Telehealth: Payer: Self-pay

## 2022-12-30 ENCOUNTER — Encounter: Payer: Self-pay | Admitting: Family Medicine

## 2022-12-30 NOTE — Telephone Encounter (Signed)
MR Lumbar Spine Wo Contrast (Order [id number])  Good afternoon! I had my X-ray yesterday morning. I can't remember if I am now supposed to call [facility] to schedule an MRI or if they will call me when they're ready to schedule. Could someone please let me know if I should reach out or if there's anything else I need to be doing right now? Thanks so much!!

## 2023-01-01 ENCOUNTER — Ambulatory Visit: Payer: BC Managed Care – PPO | Admitting: Family Medicine

## 2023-01-01 DIAGNOSIS — Z01419 Encounter for gynecological examination (general) (routine) without abnormal findings: Secondary | ICD-10-CM | POA: Diagnosis not present

## 2023-01-01 DIAGNOSIS — Z124 Encounter for screening for malignant neoplasm of cervix: Secondary | ICD-10-CM | POA: Diagnosis not present

## 2023-01-01 DIAGNOSIS — Z6827 Body mass index (BMI) 27.0-27.9, adult: Secondary | ICD-10-CM | POA: Diagnosis not present

## 2023-01-01 DIAGNOSIS — Z87410 Personal history of cervical dysplasia: Secondary | ICD-10-CM | POA: Diagnosis not present

## 2023-01-05 ENCOUNTER — Ambulatory Visit
Admission: RE | Admit: 2023-01-05 | Discharge: 2023-01-05 | Disposition: A | Discharge status: Home / Self Care | Payer: BC Managed Care – PPO | Source: Ambulatory Visit | Attending: Family Medicine | Admitting: Family Medicine

## 2023-01-05 DIAGNOSIS — M5416 Radiculopathy, lumbar region: Secondary | ICD-10-CM | POA: Diagnosis not present

## 2023-01-05 DIAGNOSIS — M5431 Sciatica, right side: Secondary | ICD-10-CM

## 2023-01-08 ENCOUNTER — Encounter: Payer: Self-pay | Admitting: Family Medicine

## 2023-01-09 ENCOUNTER — Other Ambulatory Visit: Payer: Self-pay | Admitting: Family Medicine

## 2023-01-09 DIAGNOSIS — M5416 Radiculopathy, lumbar region: Secondary | ICD-10-CM

## 2023-01-13 ENCOUNTER — Other Ambulatory Visit: Payer: Self-pay | Admitting: Family Medicine

## 2023-01-13 MED ORDER — TRAMADOL HCL 50 MG PO TABS
50.0000 mg | ORAL_TABLET | Freq: Three times a day (TID) | ORAL | 0 refills | Status: AC | PRN
Start: 2023-01-13 — End: 2023-02-02

## 2023-01-16 NOTE — Progress Notes (Unsigned)
Referring Physician:  Donita Brooks, MD [address] [place],  [place] [address]  Primary Physician:  Claudia Brooks, MD  History of Present Illness: 01/16/2023*** [name] has a history of depression, suicide attempt in 2015, and elevated triglycerides.   9 month history of Constant LBP with right posterior leg pain to her foot. No left leg pain. Numbness in right foot.    PCP gave her prednisone taper and has her on prn ultram.   Duration: *** Location: *** Quality: *** Severity: ***  Precipitating: aggravated by *** Modifying factors: made better by *** Weakness: none Timing: *** Bowel/Bladder Dysfunction: none  Conservative measures:  Physical therapy: ***  Multimodal medical therapy including regular antiinflammatories: ***  Injections: *** epidural steroid injections  Past Surgery: ***  [name] has ***no symptoms of cervical myelopathy.  The symptoms are causing a significant impact on the patient's life.   Review of Systems:  A 10 point review of systems is negative, except for the pertinent positives and negatives detailed in the HPI.  Past Medical History: Past Medical History:  Diagnosis Date   Bartholin cyst    LEFT   Cervical dysplasia    Depression    Vaginal Pap smear, abnormal     Past Surgical History: Past Surgical History:  Procedure Laterality Date   CERVICAL BIOPSY  W/ LOOP ELECTRODE EXCISION  2012   CESAREAN SECTION     '05; '07   CESAREAN SECTION N/A 02/10/2018   Procedure: REPEAT CESAREAN SECTION;  Surgeon: Claudia Hedge, MD;  Location: [facility];  Service: Obstetrics;  Laterality: N/A;  Repeat edc 02/14/18 NKDA [name],  RNFA   CHOLECYSTECTOMY  2011   LAPAROSCOPIC REMOVAL RENAL CYST Left 12/12/2015   Procedure: XI ROBOTIC LEFT RENAL CYST DECORTICATION WITH LEFT NEPHROPEXY;  Surgeon: Sebastian Ache, MD;  Location: [facility];  Service: Urology;  Laterality: Left;   ORIF CLAVICULAR FRACTURE   06/10/2011   Procedure: OPEN REDUCTION INTERNAL FIXATION (ORIF) CLAVICULAR FRACTURE;  Surgeon: Cammy Copa, MD;  Location: [facility] OR;  Service: Orthopedics;  Laterality: Right;  ORIF Right clavicle    Allergies: Allergies as of 01/20/2023   (No Known Allergies)    Medications: Outpatient Encounter Medications as of 01/20/2023  Medication Sig   traMADol (ULTRAM) 50 MG tablet Take 1 tablet (50 mg total) by mouth every 8 (eight) hours as needed for up to 20 days.   fenofibrate (TRICOR) 145 MG tablet Take 1 tablet (145 mg total) by mouth daily.   predniSONE (DELTASONE) 20 MG tablet 3 tabs poqday 1-2, 2 tabs poqday 3-4, 1 tab poqday 5-6   No facility-administered encounter medications on file as of 01/20/2023.    Social History: Social History   Tobacco Use   Smoking status: Former    Current packs/day: 0.00    Average packs/day: 0.3 packs/day for 3.0 years (0.8 ttl pk-yrs)    Types: Cigarettes    Start date: 04/21/2006    Quit date: 04/21/2009    Years since quitting: 13.7   Smokeless tobacco: Never   Tobacco comments:    smoking was mostly social  Vaping Use   Vaping status: Never Used  Substance Use Topics   Alcohol use: Yes    Comment: 1-3 glasses of wine a week.   Drug use: No    Family Medical History: Family History  Problem Relation Age of Onset   Healthy Mother    Diabetes Father    Cancer  Paternal Grandmother        CERVICAL   Prostate cancer Paternal Grandfather     Physical Examination: There were no vitals filed for this visit.  General: Patient is well developed, well nourished, calm, collected, and in no apparent distress. Attention to examination is appropriate.  Respiratory: Patient is breathing without any difficulty.   NEUROLOGICAL:     Awake, alert, oriented to person, place, and time.  Speech is clear and fluent. Fund of knowledge is appropriate.   Cranial Nerves: Pupils equal round and reactive to light.  Facial tone is symmetric.    ***  ROM of cervical spine *** pain *** posterior cervical tenderness. *** tenderness in bilateral trapezial region.   *** ROM of lumbar spine *** pain *** posterior lumbar tenderness.   No abnormal lesions on exposed skin.   Strength: Side Biceps Triceps Deltoid Interossei Grip Wrist Ext. Wrist Flex.  R 5 5 5 5 5 5 5   L 5 5 5 5 5 5 5    Side Iliopsoas Quads Hamstring PF DF EHL  R 5 5 5 5 5 5   L 5 5 5 5 5 5    Reflexes are ***2+ and symmetric at the biceps, brachioradialis, patella and achilles.   Hoffman's is absent.  Clonus is not present.   Bilateral upper and lower extremity sensation is intact to light touch.     Gait is normal.   ***No difficulty with tandem gait.    Medical Decision Making  Imaging: Lumbar MRI scan dated 01/05/23:  FINDINGS: Segmentation: Standard segmentation is assumed. The inferior-most fully formed intervertebral disc labeled L5-S1.   Alignment:  Physiologic.   Vertebrae: No evidence of acute fracture or discitis/osteomyelitis. Degenerative/discogenic endplate signal changes at L5-S1. No suspicious bone lesions.   Conus medullaris and cauda equina: Conus extends to the L1 level. Conus appears normal.   Paraspinal and other soft tissues: Unremarkable.   Disc levels:   T12-L1: No significant disc protrusion, foraminal stenosis, or canal stenosis.   L1-L2: No significant disc protrusion, foraminal stenosis, or canal stenosis.   L2-L3: No significant disc protrusion, foraminal stenosis, or canal stenosis.   L3-L4: No significant disc protrusion, foraminal stenosis, or canal stenosis.   L4-L5: Bilateral facet arthropathy.  No significant stenosis.   L5-S1: Large right eccentric disc protrusion with severe right subarticular recess stenosis and impingement of the descending right S1 nerve roots. Foramina are patent.   IMPRESSION: At L5-S1, large right eccentric disc protrusion with severe right subarticular recess stenosis and impingement  of the descending right S1 nerve roots.     Electronically Signed   By: Feliberto Harts M.D.   On: 01/09/2023 12:05    Lumbar xrays dated 12/29/22:  FINDINGS: No fracture, dislocation or subluxation. No spondylolisthesis. No osteolytic or osteoblastic changes. Spina bifida occulta noted the L5 level.   Degenerative disc disease noted with disc space narrowing and marginal osteophytes at L5-S1.   IMPRESSION: Degenerative changes. No acute osseous abnormalities.     Electronically Signed   By: Layla Maw M.D.   On: 01/06/2023 22:00  I have personally reviewed the images and agree with the above interpretation.  Assessment and Plan: Ms. Kautzman is a pleasant 37 y.o. female has ***  Treatment options discussed with patient and following plan made:   - Order for physical therapy for *** spine ***. Patient to call to schedule appointment. *** - Continue current medications including ***. Reviewed dosing and side effects.  - Prescription for ***. Reviewed  dosing and side effects. Take with food.  - Prescription for *** to take prn muscle spasms. Reviewed dosing and side effects. Discussed this can cause drowsiness.  - MRI of *** to further evaluate *** radiculopathy. No improvement time or medications (***).  - Referral to PMR at [facility] to discuss possible *** injections.  - Will schedule phone visit to review MRI results once I get them back.   I spent a total of *** minutes in face-to-face and non-face-to-face activities related to this patient's care today including review of outside records, review of imaging, review of symptoms, physical exam, discussion of differential diagnosis, discussion of treatment options, and documentation.   Thank you for involving me in the care of this patient.   Drake Leach PA-C Dept. of Neurosurgery

## 2023-01-19 DIAGNOSIS — Z713 Dietary counseling and surveillance: Secondary | ICD-10-CM | POA: Diagnosis not present

## 2023-01-20 ENCOUNTER — Encounter: Payer: Self-pay | Admitting: Orthopedic Surgery

## 2023-01-20 ENCOUNTER — Ambulatory Visit: Payer: BC Managed Care – PPO | Admitting: Orthopedic Surgery

## 2023-01-20 VITALS — BP 118/88 | Ht 62.0 in | Wt 148.0 lb

## 2023-01-20 DIAGNOSIS — M4726 Other spondylosis with radiculopathy, lumbar region: Secondary | ICD-10-CM | POA: Diagnosis not present

## 2023-01-20 DIAGNOSIS — M5126 Other intervertebral disc displacement, lumbar region: Secondary | ICD-10-CM | POA: Diagnosis not present

## 2023-01-20 DIAGNOSIS — M5416 Radiculopathy, lumbar region: Secondary | ICD-10-CM

## 2023-01-20 DIAGNOSIS — M47816 Spondylosis without myelopathy or radiculopathy, lumbar region: Secondary | ICD-10-CM

## 2023-01-20 DIAGNOSIS — M51379 Other intervertebral disc degeneration, lumbosacral region without mention of lumbar back pain or lower extremity pain: Secondary | ICD-10-CM | POA: Diagnosis not present

## 2023-01-20 MED ORDER — METHOCARBAMOL 500 MG PO TABS: 500.0000 mg | ORAL_TABLET | Freq: Three times a day (TID) | ORAL | 0 refills | Status: DC | PRN

## 2023-01-20 NOTE — Patient Instructions (Signed)
It was so nice to see you today. Thank you so much for coming in.    You have a herniated disc at L5-S1 that I think is causing your back and leg pain.   I sent physical therapy orders to [facility] in [place]. You can call them if you don't hear from them to schedule your visit. See number below.   I also sent a prescription for methocarbamol to help with muscle spasms. Use only as needed and be careful, this can make you sleepy.   I want you to see pain management here in [place] (Dr. Cherylann Ratel) to discuss possible lumbar injections. They should call you to schedule an appointment or you can call them at [phone].   If pain does not improve or if you develop weakness in the right leg, we may need to consider surgery.   I will see you back in 6 weeks. Please do not hesitate to call if you have any questions or concerns. You can also message me in MyChart.   Drake Leach PA-C [phone]

## 2023-01-26 ENCOUNTER — Ambulatory Visit: Payer: BC Managed Care – PPO | Admitting: Physical Therapy

## 2023-01-29 ENCOUNTER — Ambulatory Visit
Payer: BC Managed Care – PPO | Attending: Student in an Organized Health Care Education/Training Program | Admitting: Student in an Organized Health Care Education/Training Program

## 2023-01-29 ENCOUNTER — Encounter: Payer: Self-pay | Admitting: Student in an Organized Health Care Education/Training Program

## 2023-01-29 VITALS — BP 125/93 | HR 85 | Temp 97.5°F | Resp 16 | Ht 62.0 in | Wt 149.0 lb

## 2023-01-29 DIAGNOSIS — M5417 Radiculopathy, lumbosacral region: Secondary | ICD-10-CM | POA: Insufficient documentation

## 2023-01-29 DIAGNOSIS — M5416 Radiculopathy, lumbar region: Secondary | ICD-10-CM | POA: Diagnosis not present

## 2023-01-29 DIAGNOSIS — M5116 Intervertebral disc disorders with radiculopathy, lumbar region: Secondary | ICD-10-CM | POA: Insufficient documentation

## 2023-01-29 MED ORDER — GABAPENTIN 100 MG PO CAPS: 100.0000 mg | ORAL_CAPSULE | Freq: Every day | ORAL | 1 refills | Status: DC
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
## 2023-01-29 NOTE — Progress Notes (Signed)
Safety precautions to be maintained throughout the outpatient stay will include: orient to surroundings, keep bed in low position, maintain call bell within reach at all times, provide assistance with transfer out of bed and ambulation.  

## 2023-01-29 NOTE — Patient Instructions (Signed)

## 2023-02-01 DIAGNOSIS — M5126 Other intervertebral disc displacement, lumbar region: Secondary | ICD-10-CM

## 2023-02-01 DIAGNOSIS — M47816 Spondylosis without myelopathy or radiculopathy, lumbar region: Secondary | ICD-10-CM

## 2023-02-01 DIAGNOSIS — M5416 Radiculopathy, lumbar region: Secondary | ICD-10-CM

## 2023-02-01 DIAGNOSIS — M51379 Other intervertebral disc degeneration, lumbosacral region without mention of lumbar back pain or lower extremity pain: Secondary | ICD-10-CM

## 2023-02-02 MED ORDER — TIZANIDINE HCL 4 MG PO TABS: 4.0000 mg | ORAL_TABLET | Freq: Three times a day (TID) | ORAL | 0 refills | Status: DC | PRN

## 2023-02-02 NOTE — Addendum Note (Signed)
Addended by: Drake Leach on: 02/02/2023 12:06 PM   Modules accepted: Orders

## 2023-02-03 ENCOUNTER — Other Ambulatory Visit: Payer: Self-pay | Admitting: Family Medicine

## 2023-02-03 MED ORDER — OXYCODONE-ACETAMINOPHEN 5-325 MG PO TABS: 1.0000 | ORAL_TABLET | ORAL | 0 refills | Status: AC | PRN

## 2023-02-05 ENCOUNTER — Encounter: Payer: Self-pay | Admitting: Family Medicine

## 2023-02-05 ENCOUNTER — Ambulatory Visit: Payer: BC Managed Care – PPO | Admitting: Family Medicine

## 2023-02-05 VITALS — BP 112/62 | HR 89 | Temp 98.2°F | Ht 62.0 in | Wt 147.6 lb

## 2023-02-05 DIAGNOSIS — M48061 Spinal stenosis, lumbar region without neurogenic claudication: Secondary | ICD-10-CM | POA: Diagnosis not present

## 2023-02-05 DIAGNOSIS — R002 Palpitations: Secondary | ICD-10-CM | POA: Diagnosis not present

## 2023-02-05 DIAGNOSIS — M5416 Radiculopathy, lumbar region: Secondary | ICD-10-CM | POA: Diagnosis not present

## 2023-02-05 DIAGNOSIS — Z6826 Body mass index (BMI) 26.0-26.9, adult: Secondary | ICD-10-CM | POA: Diagnosis not present

## 2023-02-05 MED ORDER — TRAMADOL HCL 50 MG PO TABS: 50.0000 mg | ORAL_TABLET | Freq: Three times a day (TID) | ORAL | 0 refills | Status: AC | PRN

## 2023-02-05 MED ORDER — METOPROLOL TARTRATE 25 MG PO TABS: 25.0000 mg | ORAL_TABLET | Freq: Two times a day (BID) | ORAL | 3 refills | Status: DC | PRN

## 2023-02-05 NOTE — Progress Notes (Signed)
Subjective:    Patient ID: [name], female    DOB: Sep 02, 1985, 37 y.o.   MRN: [id number] Patient is a very pleasant 37 year old Caucasian female who has been dealing with right-sided sciatic nerve pain since January.  She is scheduled to receive an epidural steroid injection this afternoon.  MRI confirmed a severe bulging disc at L5-S1 causing nerve impingement.  She is in constant pain.  She was unable to take oxycodone if this upsets her stomach.  She is requesting a refill of tramadol.  She is considering having surgery.  However she is not resting well at night due to the pain.  She is also worried about having surgery.  Recently she has developed frequent palpitations.  She states they will occur without provocation.  She will feel her heart skip a beat and then beat normally.  The skin beat lasts a second or 2 and then her heart will resume a normal rhythm.  She denies any chest pain or shortness of breath or dyspnea on exertion.  She denies any syncope or near syncope. Past Medical History:  Diagnosis Date   Bartholin cyst    LEFT   Cervical dysplasia    Depression    Vaginal Pap smear, abnormal    Past Surgical History:  Procedure Laterality Date   CERVICAL BIOPSY  W/ LOOP ELECTRODE EXCISION  2012   CESAREAN SECTION     '05; '07   CESAREAN SECTION N/A 02/10/2018   Procedure: REPEAT CESAREAN SECTION;  Surgeon: Harold Hedge, MD;  Location: [facility];  Service: Obstetrics;  Laterality: N/A;  Repeat edc 02/14/18 NKDA Heather,  RNFA   CHOLECYSTECTOMY  2011   LAPAROSCOPIC REMOVAL RENAL CYST Left 12/12/2015   Procedure: XI ROBOTIC LEFT RENAL CYST DECORTICATION WITH LEFT NEPHROPEXY;  Surgeon: Sebastian Ache, MD;  Location: [facility];  Service: Urology;  Laterality: Left;   ORIF CLAVICULAR FRACTURE  06/10/2011   Procedure: OPEN REDUCTION INTERNAL FIXATION (ORIF) CLAVICULAR FRACTURE;  Surgeon: Cammy Copa, MD;  Location: [facility] OR;  Service: Orthopedics;  Laterality: Right;   ORIF Right clavicle      No Known Allergies Social History   Socioeconomic History   Marital status: Single    Spouse name: Not on file   Number of children: 2   Years of education: 52   Highest education level: Professional school degree (e.g., MD, DDS, DVM, JD)  Occupational History   Occupation: Lawyer  Tobacco Use   Smoking status: Former    Current packs/day: 0.00    Average packs/day: 0.3 packs/day for 3.0 years (0.8 ttl pk-yrs)    Types: Cigarettes    Start date: 04/21/2006    Quit date: 04/21/2009    Years since quitting: 13.8   Smokeless tobacco: Never   Tobacco comments:    smoking was mostly social  Vaping Use   Vaping status: Never Used  Substance and Sexual Activity   Alcohol use: Yes    Comment: 1-3 glasses of wine a week.   Drug use: No   Sexual activity: Yes    Birth control/protection: Pill  Other Topics Concern   Not on file  Social History Narrative   Fun: Work and hang out with children   Denies abuse and feels safe at home.    Social Determinants of Health   Financial Resource Strain: Low Risk  (02/01/2023)   Overall Financial Resource Strain (CARDIA)    Difficulty of Paying Living Expenses: Not hard at all  Food Insecurity:  No Food Insecurity (02/01/2023)   Hunger Vital Sign    Worried About Running Out of Food in the Last Year: Never true    Ran Out of Food in the Last Year: Never true  Transportation Needs: No Transportation Needs (02/01/2023)   PRAPARE - Administrator, Civil Service (Medical): No    Lack of Transportation (Non-Medical): No  Physical Activity: Inactive (02/01/2023)   Exercise Vital Sign    Days of Exercise per Week: 0 days    Minutes of Exercise per Session: 30 min  Stress: Stress Concern Present (02/01/2023)   [employer] of Occupational Health - Occupational Stress Questionnaire    Feeling of Stress : Very much  Social Connections: Moderately Integrated (02/01/2023)   Social Connection and Isolation  Panel [NHANES]    Frequency of Communication with Friends and Family: More than three times a week    Frequency of Social Gatherings with Friends and Family: Three times a week    Attends Religious Services: More than 4 times per year    Active Member of Clubs or Organizations: Yes    Attends Banker Meetings: More than 4 times per year    Marital Status: Never married  Intimate Partner Violence: Unknown (07/23/2021)   Received from [employer]   HITS    Physically Hurt: Not on file    Insult or Talk Down To: Not on file    Threaten Physical Harm: Not on file    Scream or Curse: Not on file     Review of Systems  All other systems reviewed and are negative.      Objective:   Physical Exam Vitals reviewed.  Constitutional:      General: She is not in acute distress.    Appearance: Normal appearance. She is normal weight. She is not ill-appearing, toxic-appearing or diaphoretic.  HENT:     Head: Normocephalic and atraumatic.     Right Ear: Hearing normal.     Left Ear: Hearing normal.  Neck:     Vascular: No carotid bruit.  Cardiovascular:     Rate and Rhythm: Normal rate and regular rhythm.     Heart sounds: Normal heart sounds. No murmur heard.    No friction rub. No gallop.  Pulmonary:     Effort: Pulmonary effort is normal. No respiratory distress.     Breath sounds: Normal breath sounds. No wheezing or rales.  Musculoskeletal:     Cervical back: Normal range of motion and neck supple.     Lumbar back: Tenderness present. Decreased range of motion.       Back:       Legs:  Lymphadenopathy:     Cervical: No cervical adenopathy.  Neurological:     General: No focal deficit present.     Mental Status: She is alert and oriented to person, place, and time. Mental status is at baseline.     Motor: No weakness.     Gait: Gait normal.  Psychiatric:        Mood and Affect: Mood normal.        Behavior: Behavior normal.        Thought  Content: Thought content normal.        Judgment: Judgment normal.    I was able to auscultate a PVC today on her exam       Assessment & Plan:  Palpitations I refilled the patient's tramadol today.  I believe the patient is  having PVCs likely due to stress and anxiety and not resting well and pain.  I recommended metoprolol 25 mg twice daily as needed.  We recently checked her labs and her thyroid was normal.  She has no reason to have any electrolyte disturbances.  She declines additional lab work today.  She will plan to take the metoprolol as needed and if frequent or worsening we will get a cardiac monitor

## 2023-02-09 ENCOUNTER — Ambulatory Visit
Admission: RE | Admit: 2023-02-09 | Discharge: 2023-02-09 | Disposition: A | Discharge status: Home / Self Care | Payer: BC Managed Care – PPO | Source: Ambulatory Visit | Attending: Student in an Organized Health Care Education/Training Program | Admitting: Student in an Organized Health Care Education/Training Program

## 2023-02-09 ENCOUNTER — Ambulatory Visit
Payer: BC Managed Care – PPO | Attending: Student in an Organized Health Care Education/Training Program | Admitting: Student in an Organized Health Care Education/Training Program

## 2023-02-09 ENCOUNTER — Encounter: Payer: Self-pay | Admitting: Student in an Organized Health Care Education/Training Program

## 2023-02-09 DIAGNOSIS — M5417 Radiculopathy, lumbosacral region: Secondary | ICD-10-CM | POA: Insufficient documentation

## 2023-02-09 DIAGNOSIS — M5116 Intervertebral disc disorders with radiculopathy, lumbar region: Secondary | ICD-10-CM | POA: Insufficient documentation

## 2023-02-09 DIAGNOSIS — M5416 Radiculopathy, lumbar region: Secondary | ICD-10-CM | POA: Diagnosis not present

## 2023-02-09 MED ORDER — DEXAMETHASONE SODIUM PHOSPHATE 10 MG/ML IJ SOLN
10.0000 mg | Freq: Once | INTRAMUSCULAR | Status: AC
  Administered 2023-02-09: 10 mg

## 2023-02-09 MED ORDER — LIDOCAINE HCL 2 % IJ SOLN
20.0000 mL | Freq: Once | INTRAMUSCULAR | Status: AC
  Administered 2023-02-09: 100 mg
  Filled 2023-02-09: qty 40

## 2023-02-09 MED ORDER — LIDOCAINE HCL 2 % IJ SOLN
INTRAMUSCULAR | Status: AC
  Filled 2023-02-09: qty 20

## 2023-02-09 MED ORDER — IOHEXOL 180 MG/ML  SOLN
10.0000 mL | Freq: Once | INTRAMUSCULAR | Status: AC
  Administered 2023-02-09: 10 mL via EPIDURAL
  Filled 2023-02-09: qty 20

## 2023-02-09 MED ORDER — ROPIVACAINE HCL 2 MG/ML IJ SOLN
1.0000 mL | Freq: Once | INTRAMUSCULAR | Status: AC
  Administered 2023-02-09: 1 mL via EPIDURAL

## 2023-02-09 MED ORDER — ROPIVACAINE HCL 2 MG/ML IJ SOLN
1.0000 mL | Freq: Once | INTRAMUSCULAR | Status: AC
  Administered 2023-02-09: 1 mL via EPIDURAL
  Filled 2023-02-09: qty 20

## 2023-02-09 MED ORDER — DEXAMETHASONE SODIUM PHOSPHATE 10 MG/ML IJ SOLN
INTRAMUSCULAR | Status: AC
  Filled 2023-02-09: qty 1

## 2023-02-09 MED ORDER — SODIUM CHLORIDE (PF) 0.9 % IJ SOLN
INTRAMUSCULAR | Status: AC
  Filled 2023-02-09: qty 10

## 2023-02-09 MED ORDER — SODIUM CHLORIDE 0.9% FLUSH
1.0000 mL | Freq: Once | INTRAVENOUS | Status: AC
  Administered 2023-02-09: 1 mL

## 2023-02-09 MED ORDER — IOHEXOL 180 MG/ML  SOLN
10.0000 mL | Freq: Once | INTRAMUSCULAR | Status: AC
  Administered 2023-02-09: 10 mL via EPIDURAL

## 2023-02-09 MED ORDER — ROPIVACAINE HCL 2 MG/ML IJ SOLN
INTRAMUSCULAR | Status: AC
Start: 2023-02-09 — End: ?
  Filled 2023-02-09: qty 20

## 2023-02-09 MED ORDER — DEXAMETHASONE SODIUM PHOSPHATE 10 MG/ML IJ SOLN
10.0000 mg | Freq: Once | INTRAMUSCULAR | Status: AC
  Administered 2023-02-09: 10 mg
  Filled 2023-02-09: qty 1

## 2023-02-09 MED ORDER — IOHEXOL 180 MG/ML  SOLN
INTRAMUSCULAR | Status: AC
Start: 2023-02-09 — End: ?
  Filled 2023-02-09: qty 20

## 2023-02-09 MED ORDER — IOHEXOL 180 MG/ML  SOLN
INTRAMUSCULAR | Status: AC
  Filled 2023-02-09: qty 20

## 2023-02-09 MED ORDER — ROPIVACAINE HCL 2 MG/ML IJ SOLN
INTRAMUSCULAR | Status: AC
  Filled 2023-02-09: qty 20

## 2023-02-09 NOTE — Progress Notes (Signed)
Safety precautions to be maintained throughout the outpatient stay will include: orient to surroundings, keep bed in low position, maintain call bell within reach at all times, provide assistance with transfer out of bed and ambulation.  

## 2023-02-09 NOTE — Progress Notes (Signed)
PROVIDER NOTE: Interpretation of information contained herein should be left to medically-trained personnel. Specific patient instructions are provided elsewhere under "Patient Instructions" section of medical record. This document was created in part using STT-dictation technology, any transcriptional errors that may result from this process are unintentional.  Patient: [name] Type: Established DOB: 1985-04-26 MRN: [id number] PCP: Donita Brooks, MD  Service: Procedure DOS: 02/09/2023 Setting: Ambulatory Location: Ambulatory outpatient facility Delivery: Face-to-face Provider: Edward Jolly, MD Specialty: Interventional Pain Management Specialty designation: 09 Location: Outpatient facility Ref. Prov.: Edward Jolly, MD       Interventional Therapy   Procedure: Sacral S1 TF epidural steroid injection #1  Laterality: Right (-RT)  Level: S1 nerve root(s) Imaging: Fluoroscopy-guided         Anesthesia: Local anesthesia (1-2% Lidocaine) Sedation: No Sedation                       DOS: 02/09/2023  Performed by: Edward Jolly, MD  Purpose: Diagnostic/Therapeutic Indications: Lumbar radicular pain severe enough to impact quality of life or function. 1. Lumbar radiculopathy   2. Lumbosacral radiculopathy at S1 (RIGHT)   3. Lumbar disc herniation with radiculopathy    NAS-11 Pain score:   Pre-procedure: 7 /10   Post-procedure: 2 /10     Position / Prep / Materials:  Position: Prone  Prep solution: ChloraPrep (2% chlorhexidine gluconate and 70% isopropyl alcohol) Prep Area: Entire Posterior Lumbosacral Area.  From the lower tip of the scapula down to the tailbone and from flank to flank. Materials:  Tray: Block Needle(s):  Type: Spinal  Gauge (G): 22  Length: 5-in  Qty: 1      H&P (Pre-op Assessment):  [name] is a 37 y.o. (year old), female patient, seen today for interventional treatment. She  has a past surgical history that includes Cholecystectomy (2011); Cervical  biopsy w/ loop electrode excision (2012); ORIF clavicular fracture (06/10/2011); Cesarean section; Laparoscopic removal renal cyst (Left, 12/12/2015); and Cesarean section (N/A, 02/10/2018). [name] has a current medication list which includes the following prescription(s): fenofibrate, gabapentin, metoprolol tartrate, tizanidine, and tramadol. Her primarily concern today is the Back Pain (lower)  Initial Vital Signs:  Pulse/HCG Rate: (!) 108ECG Heart Rate: (!) 105 Temp: (!) 97 F (36.1 C) Resp: 16 BP: (!) 118/2 SpO2: 100 %  BMI: Estimated body mass index is 27.44 kg/m as calculated from the following:   Height as of this encounter: 5\' 2"  (1.575 m).   Weight as of this encounter: 150 lb (68 kg).  Risk Assessment: Allergies: Reviewed. She has No Known Allergies.  Allergy Precautions: None required Coagulopathies: Reviewed. None identified.  Blood-thinner therapy: None at this time Active Infection(s): Reviewed. None identified. [name] is afebrile  Site Confirmation: [name] was asked to confirm the procedure and laterality before marking the site Procedure checklist: Completed Consent: Before the procedure and under the influence of no sedative(s), amnesic(s), or anxiolytics, the patient was informed of the treatment options, risks and possible complications. To fulfill our ethical and legal obligations, as recommended by the American Medical Association's Code of Ethics, I have informed the patient of my clinical impression; the nature and purpose of the treatment or procedure; the risks, benefits, and possible complications of the intervention; the alternatives, including doing nothing; the risk(s) and benefit(s) of the alternative treatment(s) or procedure(s); and the risk(s) and benefit(s) of doing nothing. The patient was provided information about the general risks and possible complications associated with the procedure. These  may include, but are not limited to: failure to  achieve desired goals, infection, bleeding, organ or nerve damage, allergic reactions, paralysis, and death. In addition, the patient was informed of those risks and complications associated to Spine-related procedures, such as failure to decrease pain; infection (i.e.: Meningitis, epidural or intraspinal abscess); bleeding (i.e.: epidural hematoma, subarachnoid hemorrhage, or any other type of intraspinal or peri-dural bleeding); organ or nerve damage (i.e.: Any type of peripheral nerve, nerve root, or spinal cord injury) with subsequent damage to sensory, motor, and/or autonomic systems, resulting in permanent pain, numbness, and/or weakness of one or several areas of the body; allergic reactions; (i.e.: anaphylactic reaction); and/or death. Furthermore, the patient was informed of those risks and complications associated with the medications. These include, but are not limited to: allergic reactions (i.e.: anaphylactic or anaphylactoid reaction(s)); adrenal axis suppression; blood sugar elevation that in diabetics may result in ketoacidosis or comma; water retention that in patients with history of congestive heart failure may result in shortness of breath, pulmonary edema, and decompensation with resultant heart failure; weight gain; swelling or edema; medication-induced neural toxicity; particulate matter embolism and blood vessel occlusion with resultant organ, and/or nervous system infarction; and/or aseptic necrosis of one or more joints. Finally, the patient was informed that Medicine is not an exact science; therefore, there is also the possibility of unforeseen or unpredictable risks and/or possible complications that may result in a catastrophic outcome. The patient indicated having understood very clearly. We have given the patient no guarantees and we have made no promises. Enough time was given to the patient to ask questions, all of which were answered to the patient's satisfaction. [name] has  indicated that she wanted to continue with the procedure. Attestation: I, the ordering provider, attest that I have discussed with the patient the benefits, risks, side-effects, alternatives, likelihood of achieving goals, and potential problems during recovery for the procedure that I have provided informed consent. Date  Time: 02/09/2023  7:57 AM   Pre-Procedure Preparation:  Monitoring: As per clinic protocol. Respiration, ETCO2, SpO2, BP, heart rate and rhythm monitor placed and checked for adequate function Safety Precautions: Patient was assessed for positional comfort and pressure points before starting the procedure. Time-out: I initiated and conducted the "Time-out" before starting the procedure, as per protocol. The patient was asked to participate by confirming the accuracy of the "Time Out" information. Verification of the correct person, site, and procedure were performed and confirmed by me, the nursing staff, and the patient. "Time-out" conducted as per Joint Commission's Universal Protocol (UP.01.01.01). Time: 0945 Start Time: 0945 hrs.  Description/Narrative of Procedure:          Target: The 6 o'clock position under the pedicle, on the affected side. Region: Posterolateral Lumbosacral Approach: Posterior Percutaneous Paravertebral approach.  Rationale (medical necessity): procedure needed and proper for the diagnosis and/or treatment of the patient's medical symptoms and needs. Procedural Technique Safety Precautions: Aspiration looking for blood return was conducted prior to all injections. At no point did we inject any substances, as a needle was being advanced. No attempts were made at seeking any paresthesias. Safe injection practices and needle disposal techniques used. Medications properly checked for expiration dates. SDV (single dose vial) medications used. Description of the Procedure: Protocol guidelines were followed. The patient was placed in position over the  procedure table. The target area was identified and the area prepped in the usual manner. Skin & deeper tissues infiltrated with local anesthetic. Appropriate amount of time allowed to  pass for local anesthetics to take effect. The procedure needles were then advanced to the target area. Proper needle placement secured. Negative aspiration confirmed. Solution injected in intermittent fashion, asking for systemic symptoms every 0.5cc of injectate. The needles were then removed and the area cleansed, making sure to leave some of the prepping solution back to take advantage of its long term bactericidal properties.  Vitals:   02/09/23 0940 02/09/23 0945 02/09/23 0950 02/09/23 0951  BP: (!) 120/91 (!) 119/93 (!) 131/97 (!) 127/92  Pulse:      Resp: 15 13 13 13   Temp:      SpO2: 96% 95% 96% 96%  Weight:      Height:        Start Time: 0945 hrs. End Time: 0951 hrs.  Imaging Guidance (Spinal):          Type of Imaging Technique: Fluoroscopy Guidance (Spinal) Indication(s): Assistance in needle guidance and placement for procedures requiring needle placement in or near specific anatomical locations not easily accessible without such assistance. Exposure Time: Please see nurses notes. Contrast: Before injecting any contrast, we confirmed that the patient did not have an allergy to iodine, shellfish, or radiological contrast. Once satisfactory needle placement was completed at the desired level, radiological contrast was injected. Contrast injected under live fluoroscopy. No contrast complications. See chart for type and volume of contrast used. Fluoroscopic Guidance: I was personally present during the use of fluoroscopy. "Tunnel Vision Technique" used to obtain the best possible view of the target area. Parallax error corrected before commencing the procedure. "Direction-depth-direction" technique used to introduce the needle under continuous pulsed fluoroscopy. Once target was reached,  antero-posterior, oblique, and lateral fluoroscopic projection used confirm needle placement in all planes. Images permanently stored in EMR. Interpretation: I personally interpreted the imaging intraoperatively. Adequate needle placement confirmed in multiple planes. Appropriate spread of contrast into desired area was observed. No evidence of afferent or efferent intravascular uptake. No intrathecal or subarachnoid spread observed. Permanent images saved into the patient's record.  Post-operative Assessment:  Post-procedure Vital Signs:  Pulse/HCG Rate: (!) 10892 Temp: (!) 97 F (36.1 C) Resp: 13 BP: (!) 127/92 SpO2: 96 %  EBL: None  Complications: No immediate post-treatment complications observed by team, or reported by patient.  Note: The patient tolerated the entire procedure well. A repeat set of vitals were taken after the procedure and the patient was kept under observation following institutional policy, for this type of procedure. Post-procedural neurological assessment was performed, showing return to baseline, prior to discharge. The patient was provided with post-procedure discharge instructions, including a section on how to identify potential problems. Should any problems arise concerning this procedure, the patient was given instructions to immediately contact us, at any time, without hesitation. In any case, we plan to contact the patient by telephone for a follow-up status report regarding this interventional procedure.  Comments:  No additional relevant information.  Plan of Care (POC)  Orders:  Orders Placed This Encounter  Procedures   DG PAIN CLINIC C-ARM 1-60 MIN NO REPORT    Intraoperative interpretation by procedural physician at [facility].    Standing Status:   Standing    Number of Occurrences:   1    Order Specific Question:   Reason for exam:    Answer:   Assistance in needle guidance and placement for procedures requiring needle placement in or  near specific anatomical locations not easily accessible without such assistance.     Medications ordered for  procedure: Meds ordered this encounter  Medications   iohexol (OMNIPAQUE) 180 MG/ML injection 10 mL    Must be Myelogram-compatible. If not available, you may substitute with a water-soluble, non-ionic, hypoallergenic, myelogram-compatible radiological contrast medium.   lidocaine (XYLOCAINE) 2 % (with pres) injection 400 mg   sodium chloride flush (NS) 0.9 % injection 1 mL   ropivacaine (PF) 2 mg/mL (0.2%) (NAROPIN) injection 1 mL   dexamethasone (DECADRON) injection 10 mg   iohexol (OMNIPAQUE) 180 MG/ML injection 10 mL    Must be Myelogram-compatible. If not available, you may substitute with a water-soluble, non-ionic, hypoallergenic, myelogram-compatible radiological contrast medium.   dexamethasone (DECADRON) injection 10 mg   ropivacaine (PF) 2 mg/mL (0.2%) (NAROPIN) injection 1 mL   Medications administered: We administered iohexol, lidocaine, sodium chloride flush, ropivacaine (PF) 2 mg/mL (0.2%), dexamethasone, iohexol, dexamethasone, and ropivacaine (PF) 2 mg/mL (0.2%).  See the medical record for exact dosing, route, and time of administration.  Follow-up plan:   Return in about 3 weeks (around 03/02/2023) for PPE, F2F.       Recent Visits Date Type Provider Dept  01/29/23 Office Visit Edward Jolly, MD [facility]  Showing recent visits within past 90 days and meeting all other requirements Today's Visits Date Type Provider Dept  02/09/23 Procedure visit Edward Jolly, MD [facility]  Showing today's visits and meeting all other requirements Future Appointments Date Type Provider Dept  03/04/23 Appointment Edward Jolly, MD [facility]  Showing future appointments within next 90 days and meeting all other requirements  Disposition: Discharge home  Discharge (Date  Time): 02/09/2023; 0955 hrs.   Primary Care Physician:  Donita Brooks, MD Location: [facility] Note by: Edward Jolly, MD (TTS technology used. I apologize for any typographical errors that were not detected and corrected.) Date: 02/09/2023; Time: 11:49 AM  Disclaimer:  Medicine is not an Visual merchandiser. The only guarantee in medicine is that nothing is guaranteed. It is important to note that the decision to proceed with this intervention was based on the information collected from the patient. The Data and conclusions were drawn from the patient's questionnaire, the interview, and the physical examination. Because the information was provided in large part by the patient, it cannot be guaranteed that it has not been purposely or unconsciously manipulated. Every effort has been made to obtain as much relevant data as possible for this evaluation. It is important to note that the conclusions that lead to this procedure are derived in large part from the available data. Always take into account that the treatment will also be dependent on availability of resources and existing treatment guidelines, considered by other Pain Management Practitioners as being common knowledge and practice, at the time of the intervention. For Medico-Legal purposes, it is also important to point out that variation in procedural techniques and pharmacological choices are the acceptable norm. The indications, contraindications, technique, and results of the above procedure should only be interpreted and judged by a Board-Certified Interventional Pain Specialist with extensive familiarity and expertise in the same exact procedure and technique.

## 2023-02-09 NOTE — Patient Instructions (Signed)

## 2023-02-10 ENCOUNTER — Telehealth: Payer: Self-pay

## 2023-02-10 NOTE — Telephone Encounter (Signed)
Post procedure follow up.  The patient states the pain in her right leg is about the same.  Dr Cherylann Ratel notified.

## 2023-02-13 ENCOUNTER — Other Ambulatory Visit: Payer: Self-pay | Admitting: Family Medicine

## 2023-02-16 DIAGNOSIS — M5416 Radiculopathy, lumbar region: Secondary | ICD-10-CM | POA: Diagnosis not present

## 2023-02-16 DIAGNOSIS — M4807 Spinal stenosis, lumbosacral region: Secondary | ICD-10-CM | POA: Diagnosis not present

## 2023-02-16 DIAGNOSIS — M5117 Intervertebral disc disorders with radiculopathy, lumbosacral region: Secondary | ICD-10-CM | POA: Diagnosis not present

## 2023-02-16 NOTE — Telephone Encounter (Signed)
Requested medication (s) are due for refill today: no  Requested medication (s) are on the active medication list: yes  Last refill:  02/05/23 #30 3 RF  Future visit scheduled: no  Notes to clinic:  pharmacy requesting a 90 day fill   Requested Prescriptions  Pending Prescriptions Disp Refills   metoprolol tartrate (LOPRESSOR) 25 MG tablet [Pharmacy Med Name: METOPROLOL TARTRATE 25 MG TAB] 180 tablet 1    Sig: Take 1 tablet (25 mg total) by mouth 2 (two) times daily as needed (palpitations).     Cardiovascular:  Beta Blockers Failed - 02/13/2023  2:31 PM      Failed - Last BP in normal range    BP Readings from Last 1 Encounters:  02/09/23 (!) 127/92         Failed - Valid encounter within last 6 months    Recent Outpatient Visits           1 year ago Tachycardia   [facility] Tanya Nones, Priscille Heidelberg, MD   1 year ago Tachycardia   [facility] Tanya Nones, Priscille Heidelberg, MD   1 year ago Tachycardia   [facility] Donita Brooks, MD   1 year ago Bilateral otitis media with effusion   [facility] Valentino Nose, NP   2 years ago GAD (generalized anxiety disorder)   [facility] Pickard, Priscille Heidelberg, MD              Passed - Last Heart Rate in normal range    Pulse Readings from Last 1 Encounters:  02/09/23 (!) 108

## 2023-02-22 ENCOUNTER — Other Ambulatory Visit: Payer: Self-pay | Admitting: Family Medicine

## 2023-02-22 DIAGNOSIS — E782 Mixed hyperlipidemia: Secondary | ICD-10-CM

## 2023-02-23 ENCOUNTER — Other Ambulatory Visit: Payer: BC Managed Care – PPO

## 2023-02-23 DIAGNOSIS — E782 Mixed hyperlipidemia: Secondary | ICD-10-CM

## 2023-02-23 DIAGNOSIS — K760 Fatty (change of) liver, not elsewhere classified: Secondary | ICD-10-CM | POA: Diagnosis not present

## 2023-02-23 NOTE — Telephone Encounter (Signed)
Requested medication (s) are due for refill today:yes  Requested medication (s) are on the active medication list:yes  Last refill:  11/27/22 #30 3 RF  Future visit scheduled:no  Notes to clinic:  lab outside normal range   Requested Prescriptions  Pending Prescriptions Disp Refills   fenofibrate (TRICOR) 145 MG tablet [Pharmacy Med Name: FENOFIBRATE 145 MG TABLET] 90 tablet 1    Sig: TAKE 1 TABLET BY MOUTH EVERY DAY     Cardiovascular:  Antilipid - Fibric Acid Derivatives Failed - 02/22/2023  1:14 AM      Failed - ALT in normal range and within 360 days    ALT  Date Value Ref Range Status  08/26/2022 56 (H) 6 - 29 U/L Final         Failed - AST in normal range and within 360 days    AST  Date Value Ref Range Status  08/26/2022 38 (H) 10 - 30 U/L Final         Failed - Valid encounter within last 12 months    Recent Outpatient Visits           1 year ago Tachycardia   [facility] Pickard, Priscille Heidelberg, MD   1 year ago Tachycardia   [facility] Pickard, Priscille Heidelberg, MD   1 year ago Tachycardia   [facility] Tanya Nones, Priscille Heidelberg, MD   1 year ago Bilateral otitis media with effusion   [facility] Valentino Nose, NP   2 years ago GAD (generalized anxiety disorder)   [facility] Pickard, Priscille Heidelberg, MD              Failed - Lipid Panel in normal range within the last 12 months    Cholesterol  Date Value Ref Range Status  11/26/2022 171 <200 mg/dL Final   LDL Cholesterol (Calc)  Date Value Ref Range Status  11/26/2022  mg/dL (calc) Final    Comment:    . LDL cholesterol not calculated. Triglyceride levels greater than 400 mg/dL invalidate calculated LDL results. . Reference range: <100 . Desirable range <100 mg/dL for primary prevention;   <70 mg/dL for patients with CHD or diabetic patients  with > or = 2 CHD risk factors. Marland Kitchen LDL-C is now calculated using the Martin-Hopkins   calculation, which is a validated novel method providing  better accuracy than the Friedewald equation in the  estimation of LDL-C.  Horald Pollen et al. Lenox Ahr. 1610;960(45): 2061-2068  (http://education.[facility].com/faq/FAQ164)    HDL  Date Value Ref Range Status  11/26/2022 33 (L) > OR = 50 mg/dL Final   Triglycerides  Date Value Ref Range Status  11/26/2022 630 (H) <150 mg/dL Final    Comment:    . If a non-fasting specimen was collected, consider repeat triglyceride testing on a fasting specimen if clinically indicated.  Perry Mount et al. J. of Clin. Lipidol. 2015;9:129-169. . . There is increased risk of pancreatitis when the  triglyceride concentration is very high  (> or = 500 mg/dL, especially if > or = 4098 mg/dL).  Perry Mount et al. J. of Clin. Lipidol. 2015;9:129-169. Marland Kitchen          Passed - Cr in normal range and within 360 days    Creat  Date Value Ref Range Status  08/26/2022 0.67 0.50 - 0.97 mg/dL Final         Passed - HGB in normal range and within 360 days  Hemoglobin  Date Value Ref Range Status  08/26/2022 13.4 11.7 - 15.5 g/dL Final         Passed - HCT in normal range and within 360 days    HCT  Date Value Ref Range Status  08/26/2022 39.5 35.0 - 45.0 % Final         Passed - PLT in normal range and within 360 days    Platelets  Date Value Ref Range Status  08/26/2022 312 140 - 400 Thousand/uL Final         Passed - WBC in normal range and within 360 days    WBC  Date Value Ref Range Status  08/26/2022 8.4 3.8 - 10.8 Thousand/uL Final         Passed - eGFR is 30 or above and within 360 days    GFR, Est African American  Date Value Ref Range Status  06/21/2019 140 > OR = 60 mL/min/1.24m2 Final   GFR, Est Non African American  Date Value Ref Range Status  06/21/2019 120 > OR = 60 mL/min/1.29m2 Final   eGFR  Date Value Ref Range Status  08/26/2022 116 > OR = 60 mL/min/1.41m2 Final

## 2023-02-24 LAB — LIPID PANEL
Cholesterol: 195 mg/dL (ref <200)
HDL: 47 mg/dL — ABNORMAL LOW (ref >=50)
LDL Cholesterol (Calc): 114 mg/dL — ABNORMAL HIGH
Non-HDL Cholesterol (Calc): 148 mg/dL — ABNORMAL HIGH (ref <130)
Total CHOL/HDL Ratio: 4.1 (calc) (ref <5.0)
Triglycerides: 222 mg/dL — ABNORMAL HIGH (ref <150)

## 2023-02-27 ENCOUNTER — Telehealth: Payer: Self-pay | Admitting: Student in an Organized Health Care Education/Training Program

## 2023-02-27 NOTE — Telephone Encounter (Signed)
PT called stated that she had surgery on the area that she was having the pain. She is doing well so she wanted to cancel upcoming appt . FYI. Appt had been cancel

## 2023-03-03 ENCOUNTER — Ambulatory Visit: Payer: BC Managed Care – PPO | Admitting: Orthopedic Surgery

## 2023-03-04 ENCOUNTER — Ambulatory Visit: Payer: BC Managed Care – PPO | Admitting: Student in an Organized Health Care Education/Training Program

## 2023-03-17 DIAGNOSIS — R87610 Atypical squamous cells of undetermined significance on cytologic smear of cervix (ASC-US): Secondary | ICD-10-CM | POA: Diagnosis not present

## 2023-03-17 DIAGNOSIS — R87810 Cervical high risk human papillomavirus (HPV) DNA test positive: Secondary | ICD-10-CM | POA: Diagnosis not present

## 2023-03-26 ENCOUNTER — Ambulatory Visit: Payer: BC Managed Care – PPO | Admitting: Family Medicine

## 2023-05-05 ENCOUNTER — Other Ambulatory Visit: Payer: Self-pay | Admitting: Family Medicine

## 2023-05-05 ENCOUNTER — Encounter: Payer: Self-pay | Admitting: Family Medicine

## 2023-05-05 ENCOUNTER — Ambulatory Visit: Payer: BC Managed Care – PPO | Admitting: Family Medicine

## 2023-05-05 VITALS — BP 124/72 | HR 85 | Temp 98.7°F | Ht 62.0 in | Wt 152.4 lb

## 2023-05-05 DIAGNOSIS — F411 Generalized anxiety disorder: Secondary | ICD-10-CM | POA: Diagnosis not present

## 2023-05-05 DIAGNOSIS — F900 Attention-deficit hyperactivity disorder, predominantly inattentive type: Secondary | ICD-10-CM | POA: Diagnosis not present

## 2023-05-05 MED ORDER — CLONAZEPAM 0.5 MG PO TABS: 0.5000 mg | ORAL_TABLET | Freq: Two times a day (BID) | ORAL | 1 refills | Status: DC | PRN

## 2023-05-05 MED ORDER — VORTIOXETINE HBR 5 MG PO TABS: 5.0000 mg | ORAL_TABLET | Freq: Every day | ORAL | 3 refills | Status: AC

## 2023-05-05 MED ORDER — CELECOXIB 200 MG PO CAPS: 200.0000 mg | ORAL_CAPSULE | Freq: Two times a day (BID) | ORAL | 4 refills | Status: AC

## 2023-05-05 MED ORDER — LISDEXAMFETAMINE DIMESYLATE 30 MG PO CAPS: 30.0000 mg | ORAL_CAPSULE | Freq: Every day | ORAL | 0 refills | Status: DC

## 2023-05-05 NOTE — Progress Notes (Signed)
 Subjective:    Patient ID: [name], female    DOB: [birth year], 38 y.o.   MRN: [id number]   05/05/23 Patient is a very sweet 38 year old Caucasian female who is here today requesting to resume her medication for anxiety and depression.  When I last saw the patient she was suffering with severe back pain.  She had a pinched nerve causing significant lumbar radiculopathy.  Since I last saw her, she has undergone surgical correction.  Her back pain is much better.  However she has been put on exercise restrictions.  With exercise restrictions, she feels that her anxiety has worsened.  She used to exercise to help cope with the anxiety.  She now is having borderline panic attacks 2-3 times a week.  Previously she was taking Trintellix  10 mg a day to help control the anxiety.  She saw significant benefit from the medication however she discontinued it once her anxiety improved and also due to nausea.  She was also taking Vyvanse  30 mg a day for ADD.  She has a history of ADD causing trouble with focus.  The patient works as an pensions consultant.  Without the medication, she finds herself unable to focus at work.  She is easily distracted.  She has a hard time completing assignments in a timely fashion.  The low-dose Vyvanse  really helped manage this and she would like to resume the medication. Past Medical History:  Diagnosis Date   Bartholin cyst    LEFT   Cervical dysplasia    Depression    Vaginal Pap smear, abnormal    Past Surgical History:  Procedure Laterality Date   CERVICAL BIOPSY  W/ LOOP ELECTRODE EXCISION  2012   CESAREAN SECTION     '05; '07   CESAREAN SECTION N/A 02/10/2018   Procedure: REPEAT CESAREAN SECTION;  Surgeon: Curlene Agent, MD;  Location: [facility]THING SUIT[facility];  Service: Obstetrics;  Laterality: N/A;  Repeat edc 02/14/18 NKDA Heather,  RNFA   CHOLECYSTECTOMY  2011   LAPAROSCOPIC REMOVAL RENAL CYST Left 12/12/2015   Procedure: XI ROBOTIC LEFT RENAL CYST DECORTICATION WITH LEFT  NEPHROPEXY;  Surgeon: Ricardo Likens, MD;  Location: [facility];  Service: Urology;  Laterality: Left;   ORIF CLAVICULAR FRACTURE  06/10/2011   Procedure: OPEN REDUCTION INTERNAL FIXATION (ORIF) CLAVICULAR FRACTURE;  Surgeon: Cordella Glendia Hutchinson, MD;  Location: [facility] OR;  Service: Orthopedics;  Laterality: Right;  ORIF Right clavicle   Current Outpatient Medications on File Prior to Visit  Medication Sig Dispense Refill   fenofibrate  (TRICOR ) 145 MG tablet TAKE 1 TABLET BY MOUTH EVERY DAY 90 tablet 1   gabapentin  (NEURONTIN ) 100 MG capsule Take 1-3 capsules (100-300 mg total) by mouth at bedtime. Follow written titration schedule. 90 capsule 1   metoprolol  tartrate (LOPRESSOR ) 25 MG tablet Take 1 tablet (25 mg total) by mouth 2 (two) times daily as needed (palpitations). 30 tablet 3   tiZANidine  (ZANAFLEX ) 4 MG tablet Take 1 tablet (4 mg total) by mouth every 8 (eight) hours as needed for muscle spasms. 60 tablet 0   No current facility-administered medications on file prior to visit.   No Known Allergies Social History   Socioeconomic History   Marital status: Single    Spouse name: Not on file   Number of children: 2   Years of education: 73   Highest education level: Professional school degree (e.g., MD, DDS, DVM, JD)  Occupational History   Occupation: Lawyer  Tobacco Use   Smoking status: Former  Current packs/day: 0.00    Average packs/day: 0.3 packs/day for 3.0 years (0.8 ttl pk-yrs)    Types: Cigarettes    Start date: 04/21/2006    Quit date: 04/21/2009    Years since quitting: 14.0   Smokeless tobacco: Never   Tobacco comments:    smoking was mostly social  Vaping Use   Vaping status: Never Used  Substance and Sexual Activity   Alcohol use: Yes    Comment: 1-3 glasses of wine a week.   Drug use: No   Sexual activity: Yes    Birth control/protection: Pill  Other Topics Concern   Not on file  Social History Narrative   Fun: Work and hang out with children   Denies abuse and  feels safe at home.    Social Drivers of Corporate Investment Banker Strain: Low Risk  (05/01/2023)   Overall Financial Resource Strain (CARDIA)    Difficulty of Paying Living Expenses: Not hard at all  Food Insecurity: No Food Insecurity (05/01/2023)   Hunger Vital Sign    Worried About Running Out of Food in the Last Year: Never true    Ran Out of Food in the Last Year: Never true  Transportation Needs: No Transportation Needs (05/01/2023)   PRAPARE - Administrator, Civil Service (Medical): No    Lack of Transportation (Non-Medical): No  Physical Activity: Inactive (05/01/2023)   Exercise Vital Sign    Days of Exercise per Week: 0 days    Minutes of Exercise per Session: 30 min  Stress: Stress Concern Present (05/01/2023)   [employer] of Occupational Health - Occupational Stress Questionnaire    Feeling of Stress : Very much  Social Connections: Moderately Integrated (05/01/2023)   Social Connection and Isolation Panel [NHANES]    Frequency of Communication with Friends and Family: Three times a week    Frequency of Social Gatherings with Friends and Family: Once a week    Attends Religious Services: 1 to 4 times per year    Active Member of [employer] or Organizations: No    Attends Engineer, Structural: More than 4 times per year    Marital Status: Never married  Intimate Partner Violence: Unknown (07/23/2021)   Received from [employer], [facility]   HITS    Physically Hurt: Not on file    Insult or Talk Down To: Not on file    Threaten Physical Harm: Not on file    Scream or Curse: Not on file     Review of Systems  All other systems reviewed and are negative.      Objective:   Physical Exam Vitals reviewed.  Cardiovascular:     Rate and Rhythm: Normal rate and regular rhythm.     Heart sounds: Normal heart sounds.  Pulmonary:     Effort: Pulmonary effort is normal. No respiratory distress.     Breath sounds: Normal breath sounds. No  wheezing or rales.  Abdominal:     General: Bowel sounds are normal.     Palpations: Abdomen is soft.  Psychiatric:        Behavior: Behavior normal.        Thought Content: Thought content normal.        Judgment: Judgment normal.           Assessment & Plan:  Attention deficit hyperactivity disorder (ADHD), predominantly inattentive type  GAD (generalized anxiety disorder) We had a discussion today and the patient would like to  resume Trintellix  5 mg a day.  She will use Klonopin  sparingly 0.5 mg every 12 hours as needed for anxiety.  I gave her 30 tablets and hopefully this will last several months.  We will also resume Vyvanse  30 mg a day for ADD.  She also would like to try Celebrex  200 mg as needed for low back pain

## 2023-05-06 NOTE — Telephone Encounter (Signed)
 Requested medication (s) are due for refill today: No  Requested medication (s) are on the active medication list: Yes  Last refill:  05/05/23  Future visit scheduled: Yes  Notes to clinic:  Unable to refill per protocol, medication requires a Prior Authorization.      Requested Prescriptions  Pending Prescriptions Disp Refills   TRINTELLIX  5 MG TABS tablet [Pharmacy Med Name: TRINTELLIX  5 MG TABLET] 30 tablet 3    Sig: TAKE 1 TABLET (5 MG TOTAL) BY MOUTH DAILY.     Psychiatry: Antidepressants - Serotonin Modulator Failed - 05/06/2023 12:02 PM      Failed - Valid encounter within last 6 months    Recent Outpatient Visits           2 years ago Tachycardia   [facility] Cheril Cork, Cisco Crest, MD   2 years ago Tachycardia   [facility] Cheril Cork, Cisco Crest, MD   2 years ago Tachycardia   [facility] Austine Lefort, MD   2 years ago Bilateral otitis media with effusion   [facility] Wilhemena Harbour, NP   2 years ago GAD (generalized anxiety disorder)   [facility] Pickard, Cisco Crest, MD              Passed - Completed PHQ-2 or PHQ-9 in the last 360 days

## 2023-06-04 DIAGNOSIS — M5416 Radiculopathy, lumbar region: Secondary | ICD-10-CM | POA: Diagnosis not present

## 2023-06-09 DIAGNOSIS — M6281 Muscle weakness (generalized): Secondary | ICD-10-CM | POA: Diagnosis not present

## 2023-06-09 DIAGNOSIS — M799 Soft tissue disorder, unspecified: Secondary | ICD-10-CM | POA: Diagnosis not present

## 2023-06-09 DIAGNOSIS — M2569 Stiffness of other specified joint, not elsewhere classified: Secondary | ICD-10-CM | POA: Diagnosis not present

## 2023-06-09 DIAGNOSIS — M5416 Radiculopathy, lumbar region: Secondary | ICD-10-CM | POA: Diagnosis not present

## 2023-06-16 DIAGNOSIS — M799 Soft tissue disorder, unspecified: Secondary | ICD-10-CM | POA: Diagnosis not present

## 2023-06-16 DIAGNOSIS — M5416 Radiculopathy, lumbar region: Secondary | ICD-10-CM | POA: Diagnosis not present

## 2023-06-16 DIAGNOSIS — M6281 Muscle weakness (generalized): Secondary | ICD-10-CM | POA: Diagnosis not present

## 2023-06-16 DIAGNOSIS — M2569 Stiffness of other specified joint, not elsewhere classified: Secondary | ICD-10-CM | POA: Diagnosis not present

## 2023-06-18 DIAGNOSIS — M2569 Stiffness of other specified joint, not elsewhere classified: Secondary | ICD-10-CM | POA: Diagnosis not present

## 2023-06-18 DIAGNOSIS — M799 Soft tissue disorder, unspecified: Secondary | ICD-10-CM | POA: Diagnosis not present

## 2023-06-18 DIAGNOSIS — M6281 Muscle weakness (generalized): Secondary | ICD-10-CM | POA: Diagnosis not present

## 2023-06-18 DIAGNOSIS — M5416 Radiculopathy, lumbar region: Secondary | ICD-10-CM | POA: Diagnosis not present

## 2023-06-23 DIAGNOSIS — M799 Soft tissue disorder, unspecified: Secondary | ICD-10-CM | POA: Diagnosis not present

## 2023-06-23 DIAGNOSIS — M6281 Muscle weakness (generalized): Secondary | ICD-10-CM | POA: Diagnosis not present

## 2023-06-23 DIAGNOSIS — M5416 Radiculopathy, lumbar region: Secondary | ICD-10-CM | POA: Diagnosis not present

## 2023-06-23 DIAGNOSIS — M2569 Stiffness of other specified joint, not elsewhere classified: Secondary | ICD-10-CM | POA: Diagnosis not present

## 2023-06-25 DIAGNOSIS — M6281 Muscle weakness (generalized): Secondary | ICD-10-CM | POA: Diagnosis not present

## 2023-06-25 DIAGNOSIS — M799 Soft tissue disorder, unspecified: Secondary | ICD-10-CM | POA: Diagnosis not present

## 2023-06-25 DIAGNOSIS — M2569 Stiffness of other specified joint, not elsewhere classified: Secondary | ICD-10-CM | POA: Diagnosis not present

## 2023-06-25 DIAGNOSIS — M5416 Radiculopathy, lumbar region: Secondary | ICD-10-CM | POA: Diagnosis not present

## 2023-06-30 DIAGNOSIS — M2569 Stiffness of other specified joint, not elsewhere classified: Secondary | ICD-10-CM | POA: Diagnosis not present

## 2023-06-30 DIAGNOSIS — M5416 Radiculopathy, lumbar region: Secondary | ICD-10-CM | POA: Diagnosis not present

## 2023-06-30 DIAGNOSIS — M799 Soft tissue disorder, unspecified: Secondary | ICD-10-CM | POA: Diagnosis not present

## 2023-06-30 DIAGNOSIS — M6281 Muscle weakness (generalized): Secondary | ICD-10-CM | POA: Diagnosis not present

## 2023-07-02 DIAGNOSIS — M799 Soft tissue disorder, unspecified: Secondary | ICD-10-CM | POA: Diagnosis not present

## 2023-07-02 DIAGNOSIS — M2569 Stiffness of other specified joint, not elsewhere classified: Secondary | ICD-10-CM | POA: Diagnosis not present

## 2023-07-02 DIAGNOSIS — M6281 Muscle weakness (generalized): Secondary | ICD-10-CM | POA: Diagnosis not present

## 2023-07-02 DIAGNOSIS — M5416 Radiculopathy, lumbar region: Secondary | ICD-10-CM | POA: Diagnosis not present

## 2023-07-07 DIAGNOSIS — M2569 Stiffness of other specified joint, not elsewhere classified: Secondary | ICD-10-CM | POA: Diagnosis not present

## 2023-07-07 DIAGNOSIS — M5416 Radiculopathy, lumbar region: Secondary | ICD-10-CM | POA: Diagnosis not present

## 2023-07-07 DIAGNOSIS — M799 Soft tissue disorder, unspecified: Secondary | ICD-10-CM | POA: Diagnosis not present

## 2023-07-07 DIAGNOSIS — M6281 Muscle weakness (generalized): Secondary | ICD-10-CM | POA: Diagnosis not present

## 2023-07-09 DIAGNOSIS — M6281 Muscle weakness (generalized): Secondary | ICD-10-CM | POA: Diagnosis not present

## 2023-07-09 DIAGNOSIS — M2569 Stiffness of other specified joint, not elsewhere classified: Secondary | ICD-10-CM | POA: Diagnosis not present

## 2023-07-09 DIAGNOSIS — M799 Soft tissue disorder, unspecified: Secondary | ICD-10-CM | POA: Diagnosis not present

## 2023-07-09 DIAGNOSIS — M5416 Radiculopathy, lumbar region: Secondary | ICD-10-CM | POA: Diagnosis not present

## 2023-07-14 DIAGNOSIS — M6281 Muscle weakness (generalized): Secondary | ICD-10-CM | POA: Diagnosis not present

## 2023-07-14 DIAGNOSIS — M5416 Radiculopathy, lumbar region: Secondary | ICD-10-CM | POA: Diagnosis not present

## 2023-07-14 DIAGNOSIS — M2569 Stiffness of other specified joint, not elsewhere classified: Secondary | ICD-10-CM | POA: Diagnosis not present

## 2023-07-14 DIAGNOSIS — M799 Soft tissue disorder, unspecified: Secondary | ICD-10-CM | POA: Diagnosis not present

## 2023-07-23 DIAGNOSIS — M5416 Radiculopathy, lumbar region: Secondary | ICD-10-CM | POA: Diagnosis not present

## 2023-07-23 DIAGNOSIS — Z6826 Body mass index (BMI) 26.0-26.9, adult: Secondary | ICD-10-CM | POA: Diagnosis not present

## 2023-08-10 ENCOUNTER — Other Ambulatory Visit: Payer: Self-pay | Admitting: Family Medicine

## 2023-08-11 MED ORDER — LISDEXAMFETAMINE DIMESYLATE 30 MG PO CAPS: 30.0000 mg | ORAL_CAPSULE | Freq: Every day | ORAL | 0 refills | Status: DC

## 2023-10-14 ENCOUNTER — Other Ambulatory Visit: Payer: Self-pay | Admitting: Obstetrics and Gynecology

## 2023-10-14 DIAGNOSIS — N6310 Unspecified lump in the right breast, unspecified quadrant: Secondary | ICD-10-CM

## 2023-10-22 ENCOUNTER — Ambulatory Visit
Admission: RE | Admit: 2023-10-22 | Discharge: 2023-10-22 | Disposition: A | Discharge status: Home / Self Care | Source: Ambulatory Visit | Attending: Obstetrics and Gynecology | Admitting: Obstetrics and Gynecology

## 2023-10-22 ENCOUNTER — Other Ambulatory Visit: Payer: Self-pay | Admitting: Obstetrics and Gynecology

## 2023-10-22 DIAGNOSIS — N6320 Unspecified lump in the left breast, unspecified quadrant: Secondary | ICD-10-CM

## 2023-10-22 DIAGNOSIS — R928 Other abnormal and inconclusive findings on diagnostic imaging of breast: Secondary | ICD-10-CM | POA: Diagnosis not present

## 2023-10-22 DIAGNOSIS — N6310 Unspecified lump in the right breast, unspecified quadrant: Secondary | ICD-10-CM

## 2023-10-26 ENCOUNTER — Other Ambulatory Visit: Payer: Self-pay | Admitting: Obstetrics and Gynecology

## 2023-10-26 DIAGNOSIS — N641 Fat necrosis of breast: Secondary | ICD-10-CM

## 2023-11-15 ENCOUNTER — Encounter: Payer: Self-pay | Admitting: Family Medicine

## 2023-11-16 ENCOUNTER — Other Ambulatory Visit: Payer: Self-pay

## 2023-11-16 MED ORDER — LISDEXAMFETAMINE DIMESYLATE 30 MG PO CAPS: 30.0000 mg | ORAL_CAPSULE | Freq: Every day | ORAL | 0 refills | Status: DC

## 2023-11-17 DIAGNOSIS — Z124 Encounter for screening for malignant neoplasm of cervix: Secondary | ICD-10-CM | POA: Diagnosis not present

## 2023-11-17 DIAGNOSIS — Z01419 Encounter for gynecological examination (general) (routine) without abnormal findings: Secondary | ICD-10-CM | POA: Diagnosis not present

## 2024-01-20 IMAGING — US US ABDOMEN COMPLETE
1 series · 14 of 25 positions shown · non-contrast
Comparison: CT October 24, 2015

CLINICAL DATA: Elevated LFTs

EXAM:
ABDOMEN ULTRASOUND COMPLETE

[Series 1: us abdomen complete · 0.26mm/px · 14 of 105 slices shown]
[im 1/105]
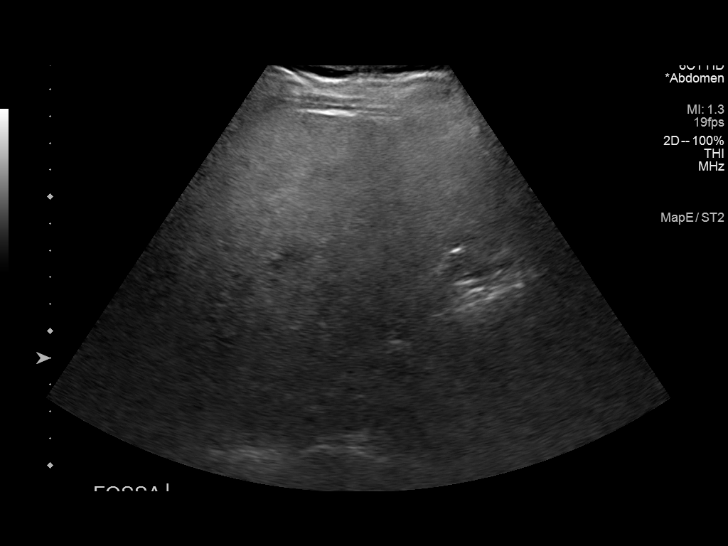
[im 9/105]
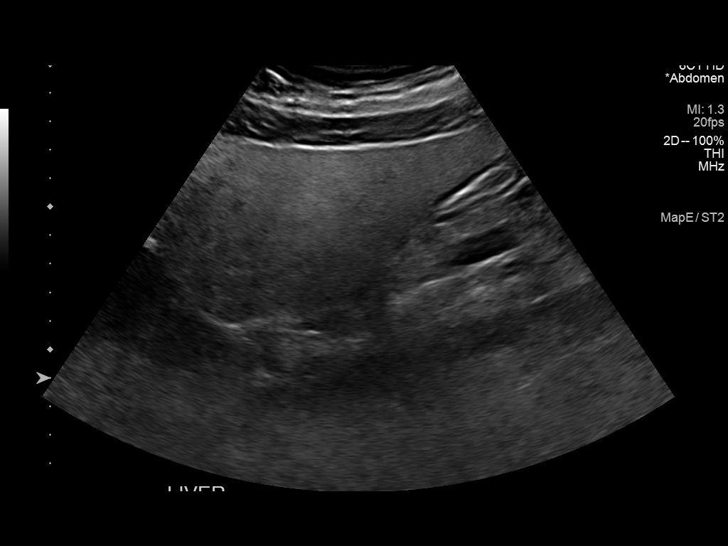
[im 18/105]
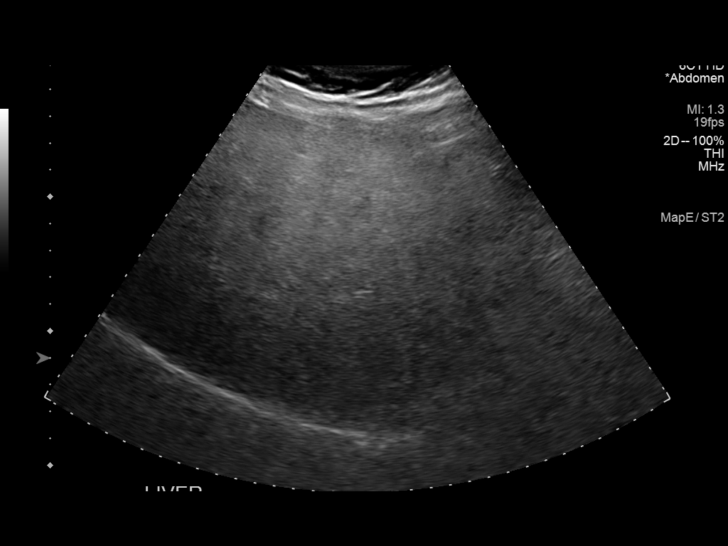
[im 27/105]
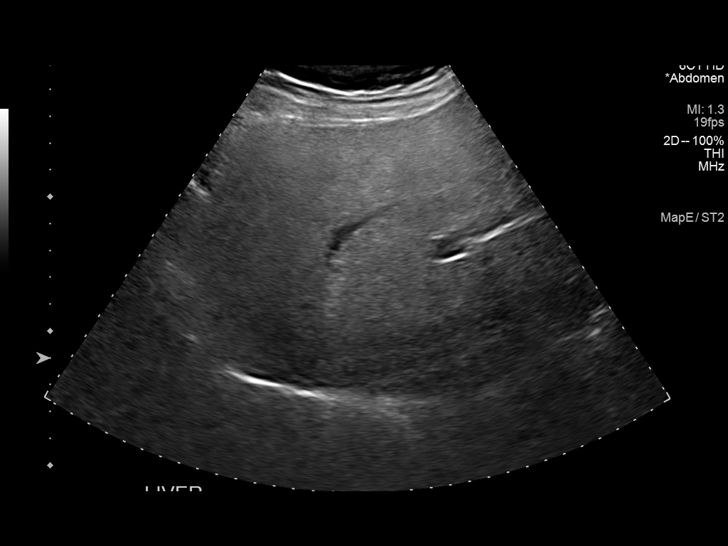
[im 35/105]
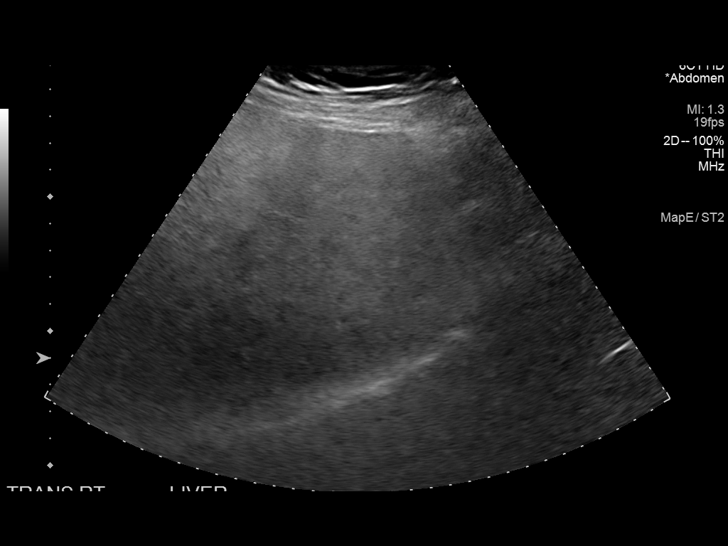
[im 40/105]
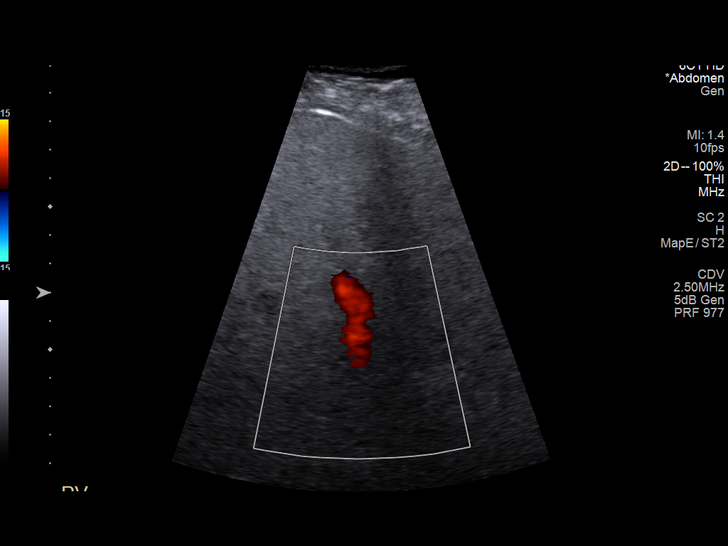
[im 48/105]
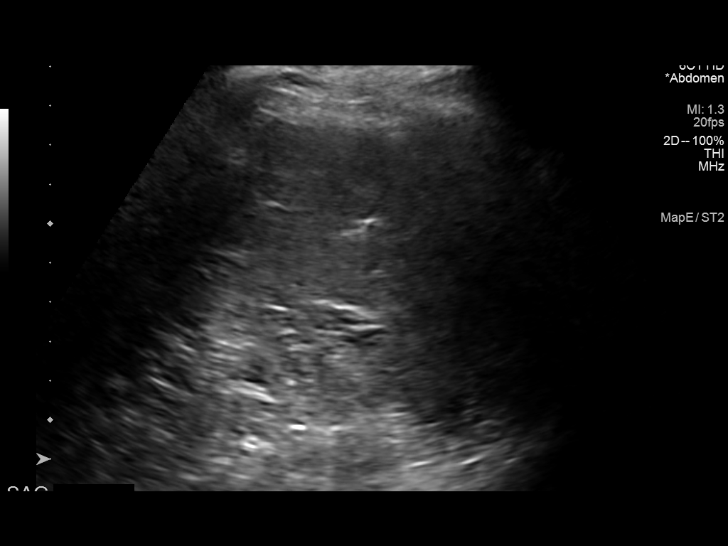
[im 57/105]
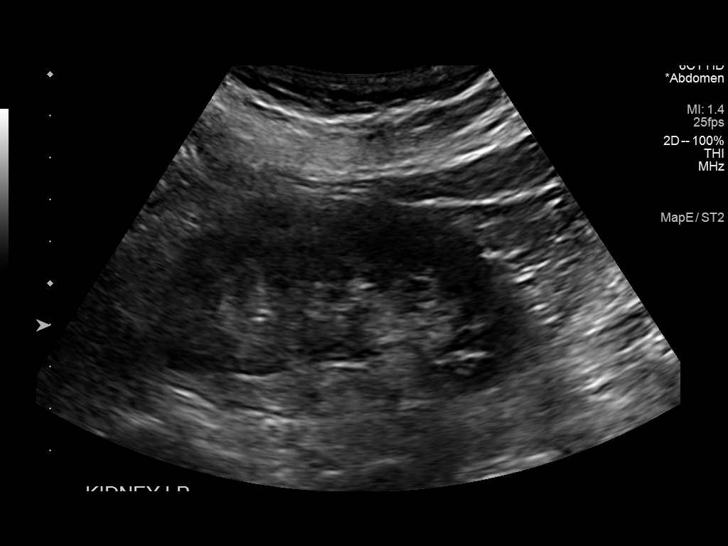
[im 66/105]
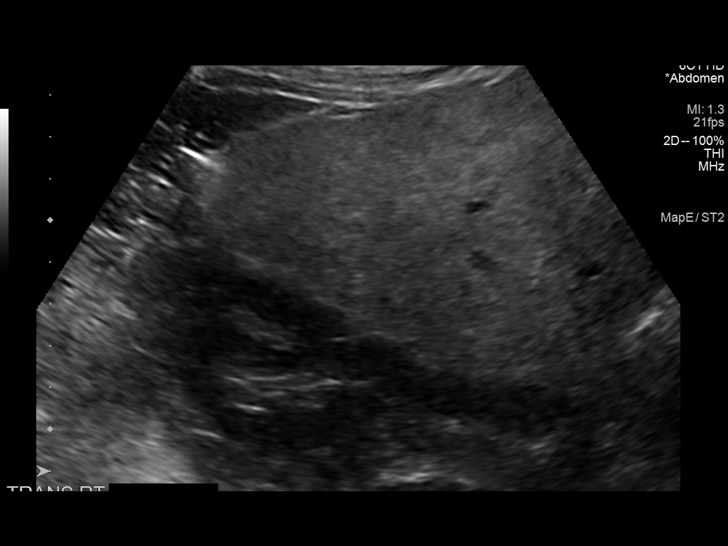
[im 70/105]
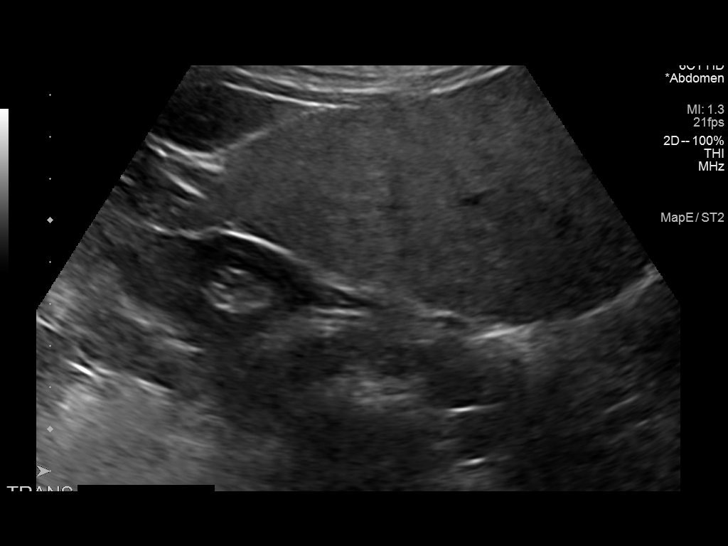
[im 79/105]
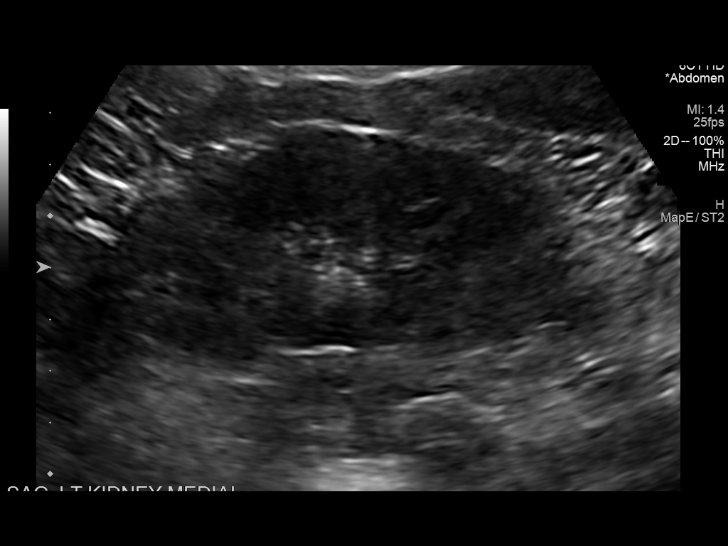
[im 87/105]
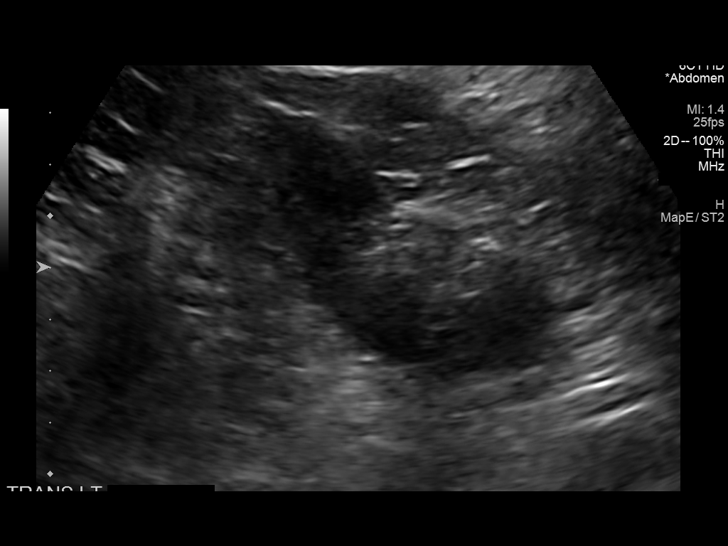
[im 96/105]
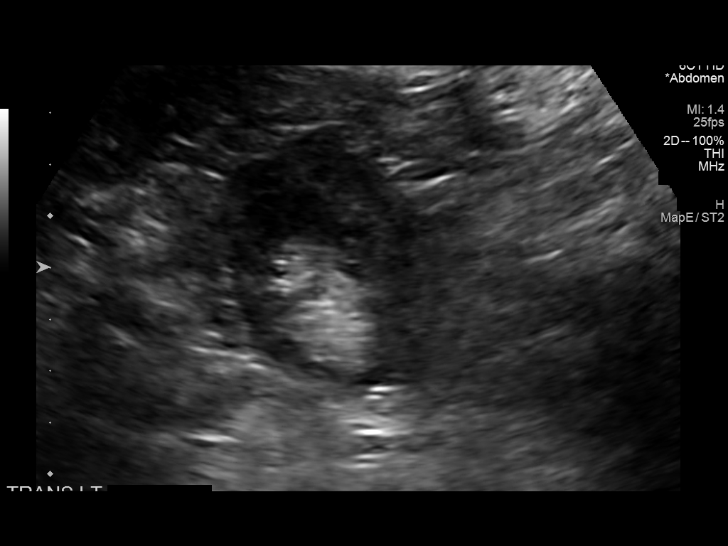
[im 105/105]
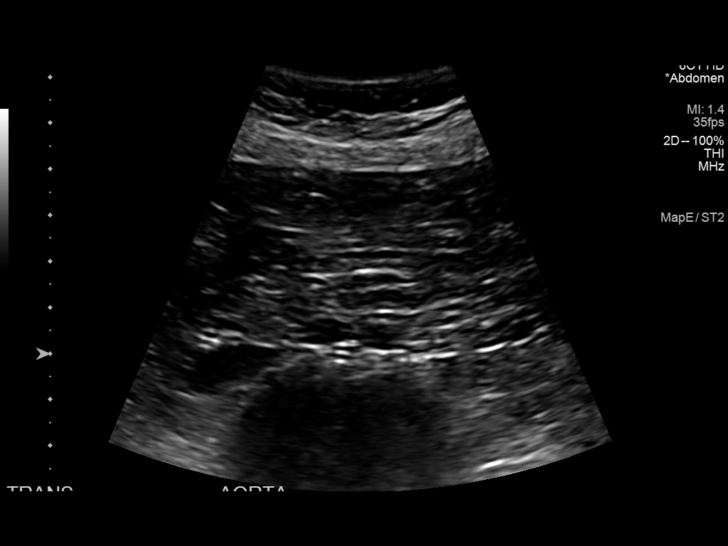

[14 of 25 positions shown; findings below may reference images not displayed]

FINDINGS: Gallbladder: Surgically absent

Common bile duct: Diameter: 2 mm

Liver: No focal lesion identified. Diffusely increased parenchymal
echogenicity. Portal vein is patent on color Doppler imaging with
normal direction of blood flow towards the liver.

IVC: No abnormality visualized.

Pancreas: Visualized portion unremarkable.

Spleen: Size and appearance within normal limits.

Right Kidney: Length: 10.3 cm. Echogenicity within normal limits. No
mass or hydronephrosis visualized.

Left Kidney: Length: 9.3 cm. Echogenicity within normal limits.
Irregularity along the upper pole related to prior cyst removal. No
mass or hydronephrosis visualized.

Abdominal aorta: No aneurysm visualized.

Other findings: None.
IMPRESSION: The echogenicity of the liver is increased. This is a nonspecific
finding but is most commonly seen with fatty infiltration of the
liver. There are no obvious focal liver lesions.

## 2024-01-26 ENCOUNTER — Ambulatory Visit
Admission: RE | Admit: 2024-01-26 | Discharge: 2024-01-26 | Disposition: A | Discharge status: Home / Self Care | Source: Ambulatory Visit | Attending: Obstetrics and Gynecology | Admitting: Obstetrics and Gynecology

## 2024-01-26 DIAGNOSIS — N641 Fat necrosis of breast: Secondary | ICD-10-CM

## 2024-01-26 DIAGNOSIS — N6489 Other specified disorders of breast: Secondary | ICD-10-CM | POA: Diagnosis not present

## 2024-02-18 ENCOUNTER — Telehealth: Payer: Self-pay

## 2024-02-18 NOTE — Telephone Encounter (Signed)
 Copied from CRM #[id number]. Topic: Clinical - Request for Lab/Test Order >> Feb 18, 2024  2:01 PM Sophia H wrote: Reason for CRM: Patient is requesting annual labs - high triglycerides. Please reach out # [phone]

## 2024-02-25 ENCOUNTER — Other Ambulatory Visit

## 2024-02-25 DIAGNOSIS — E782 Mixed hyperlipidemia: Secondary | ICD-10-CM | POA: Diagnosis not present

## 2024-02-25 DIAGNOSIS — K760 Fatty (change of) liver, not elsewhere classified: Secondary | ICD-10-CM | POA: Diagnosis not present

## 2024-02-25 DIAGNOSIS — R7989 Other specified abnormal findings of blood chemistry: Secondary | ICD-10-CM

## 2024-02-25 DIAGNOSIS — Z0000 Encounter for general adult medical examination without abnormal findings: Secondary | ICD-10-CM

## 2024-02-25 DIAGNOSIS — R002 Palpitations: Secondary | ICD-10-CM

## 2024-02-26 ENCOUNTER — Ambulatory Visit: Payer: Self-pay | Admitting: Family Medicine

## 2024-02-26 LAB — TSH: TSH: 2.56 m[IU]/L

## 2024-02-26 LAB — CBC WITH DIFFERENTIAL/PLATELET
Absolute Lymphocytes: 3002 {cells}/uL (ref 850–3900)
Absolute Monocytes: 426 {cells}/uL (ref 200–950)
Basophils Absolute: 61 {cells}/uL (ref 0–200)
Basophils Relative: 0.7 %
Eosinophils Absolute: 87 {cells}/uL (ref 15–500)
Eosinophils Relative: 1 %
HCT: 41.2 % (ref 35.0–45.0)
Hemoglobin: 13.8 g/dL (ref 11.7–15.5)
MCH: 30.3 pg (ref 27.0–33.0)
MCHC: 33.5 g/dL (ref 32.0–36.0)
MCV: 90.4 fL (ref 80.0–100.0)
MPV: 10.6 fL (ref 7.5–12.5)
Monocytes Relative: 4.9 %
Neutro Abs: 5124 {cells}/uL (ref 1500–7800)
Neutrophils Relative %: 58.9 %
Platelets: 301 Thousand/uL (ref 140–400)
RBC: 4.56 Million/uL (ref 3.80–5.10)
RDW: 11.8 % (ref 11.0–15.0)
Total Lymphocyte: 34.5 %
WBC: 8.7 Thousand/uL (ref 3.8–10.8)

## 2024-02-26 LAB — COMPLETE METABOLIC PANEL WITHOUT GFR
AG Ratio: 1.6 (calc) (ref 1.0–2.5)
ALT: 20 U/L (ref 6–29)
AST: 21 U/L (ref 10–30)
Albumin: 4.7 g/dL (ref 3.6–5.1)
Alkaline phosphatase (APISO): 56 U/L (ref 31–125)
BUN: 8 mg/dL (ref 7–25)
CO2: 21 mmol/L (ref 20–32)
Calcium: 9.8 mg/dL (ref 8.6–10.2)
Chloride: 105 mmol/L (ref 98–110)
Creat: 0.69 mg/dL (ref 0.50–0.97)
Globulin: 3 g/dL (ref 1.9–3.7)
Glucose, Bld: 89 mg/dL (ref 65–99)
Potassium: 4.1 mmol/L (ref 3.5–5.3)
Sodium: 141 mmol/L (ref 135–146)
Total Bilirubin: 1.4 mg/dL — ABNORMAL HIGH (ref 0.2–1.2)
Total Protein: 7.7 g/dL (ref 6.1–8.1)

## 2024-02-26 LAB — LIPID PANEL
Cholesterol: 170 mg/dL (ref <200)
HDL: 37 mg/dL — ABNORMAL LOW (ref >=50)
LDL Cholesterol (Calc): 81 mg/dL
Non-HDL Cholesterol (Calc): 133 mg/dL — ABNORMAL HIGH (ref <130)
Total CHOL/HDL Ratio: 4.6 (calc) (ref <5.0)
Triglycerides: 389 mg/dL — ABNORMAL HIGH (ref <150)

## 2024-02-29 ENCOUNTER — Ambulatory Visit: Admitting: Family Medicine

## 2024-02-29 ENCOUNTER — Encounter: Payer: Self-pay | Admitting: Family Medicine

## 2024-02-29 VITALS — BP 130/72 | HR 95 | Temp 98.6°F | Ht 62.0 in | Wt 146.6 lb

## 2024-02-29 DIAGNOSIS — R1013 Epigastric pain: Secondary | ICD-10-CM | POA: Diagnosis not present

## 2024-02-29 MED ORDER — PANTOPRAZOLE SODIUM 40 MG PO TBEC: 40.0000 mg | DELAYED_RELEASE_TABLET | Freq: Every day | ORAL | 1 refills | Status: AC

## 2024-02-29 NOTE — Progress Notes (Signed)
 Subjective:    Patient ID: [name], female    DOB: [birth year], 38 y.o.   MRN: [id number]  Patient reports epigastric pain.  She has been taking Goody powders for headaches almost daily for quite some time.  She reports a pain in her epigastric area that seems to get worse with food similar to the pain she experienced with gallstones however she has had her gallbladder removed.  She denies any fevers or chills.  She denies any melena or hematochezia.  She denies any nausea or vomiting.  The pain is located below the xiphoid process.  She has no tenderness to palpation in that area.  There is no rebound or guarding.  Of note she also reports depression.  She stopped Trintellix  because she felt like it was not helping.  She was only on 5 mg.  She seems somewhat defeated.  She states that she has tried everything and nothing seems to help. Past Medical History:  Diagnosis Date   ADD (attention deficit disorder)    Anxiety    Bartholin cyst    LEFT   Cervical dysplasia    Depression    Vaginal Pap smear, abnormal    Past Surgical History:  Procedure Laterality Date   CERVICAL BIOPSY  W/ LOOP ELECTRODE EXCISION  2012   CESAREAN SECTION     '05; '07   CESAREAN SECTION N/A 02/10/2018   Procedure: REPEAT CESAREAN SECTION;  Surgeon: Curlene Agent, MD;  Location: [facility] BIRTHING SUITES;  Service: Obstetrics;  Laterality: N/A;  Repeat edc 02/14/18 NKDA Heather,  RNFA   CHOLECYSTECTOMY  2011   LAPAROSCOPIC REMOVAL RENAL CYST Left 12/12/2015   Procedure: XI ROBOTIC LEFT RENAL CYST DECORTICATION WITH LEFT NEPHROPEXY;  Surgeon: Ricardo Likens, MD;  Location: [facility];  Service: Urology;  Laterality: Left;   ORIF CLAVICULAR FRACTURE  06/10/2011   Procedure: OPEN REDUCTION INTERNAL FIXATION (ORIF) CLAVICULAR FRACTURE;  Surgeon: Cordella Glendia Hutchinson, MD;  Location[facility] OR;  Service: Orthopedics;  Laterality: Right;  ORIF Right clavicle   Current Outpatient Medications on File Prior to Visit  Medication Sig  Dispense Refill   clonazePAM  (KLONOPIN ) 0.5 MG tablet Take 1 tablet (0.5 mg total) by mouth 2 (two) times daily as needed for anxiety. 30 tablet 1   lisdexamfetamine (VYVANSE ) 30 MG capsule Take 1 capsule (30 mg total) by mouth daily. 30 capsule 0   celecoxib  (CELEBREX ) 200 MG capsule Take 1 capsule (200 mg total) by mouth 2 (two) times daily. (Patient not taking: Reported on 02/29/2024) 60 capsule 4   fenofibrate  (TRICOR ) 145 MG tablet TAKE 1 TABLET BY MOUTH EVERY DAY (Patient not taking: Reported on 02/29/2024) 90 tablet 1   metoprolol  tartrate (LOPRESSOR ) 25 MG tablet TAKE 1 TABLET (25 MG TOTAL) BY MOUTH 2 (TWO) TIMES DAILY AS NEEDED (PALPITATIONS). (Patient not taking: Reported on 02/29/2024) 180 tablet 1   metroNIDAZOLE (FLAGYL) 500 MG tablet Take 500 mg by mouth every 12 (twelve) hours. for 7 days (Patient not taking: Reported on 02/29/2024)     traMADol  (ULTRAM ) 50 MG tablet Take 50 mg by mouth every 6 (six) hours as needed. (Patient not taking: Reported on 02/29/2024)     vortioxetine  HBr (TRINTELLIX ) 5 MG TABS tablet Take 1 tablet (5 mg total) by mouth daily. (Patient not taking: Reported on 02/29/2024) 30 tablet 3   No current facility-administered medications on file prior to visit.   No Known Allergies Social History   Socioeconomic History   Marital status: Single  Spouse name: Not on file   Number of children: 2   Years of education: 107   Highest education level: Professional school degree (e.g., MD, DDS, DVM, JD)  Occupational History   Occupation: Lawyer  Tobacco Use   Smoking status: Former    Current packs/day: 0.00    Average packs/day: 0.3 packs/day for 3.0 years (0.8 ttl pk-yrs)    Types: Cigarettes    Start date: 04/21/2006    Quit date: 04/21/2009    Years since quitting: 14.8   Smokeless tobacco: Never   Tobacco comments:    smoking was mostly social  Vaping Use   Vaping status: Never Used  Substance and Sexual Activity   Alcohol use: Yes    Comment: 1-3  glasses of wine a week.   Drug use: No   Sexual activity: Yes    Birth control/protection: Pill  Other Topics Concern   Not on file  Social History Narrative   Fun: Work and hang out with children   Denies abuse and feels safe at home.    Social Drivers of Corporate Investment Banker Strain: Low Risk  (02/27/2024)   Overall Financial Resource Strain (CARDIA)    Difficulty of Paying Living Expenses: Not hard at all  Food Insecurity: No Food Insecurity (02/27/2024)   Hunger Vital Sign    Worried About Running Out of Food in the Last Year: Never true    Ran Out of Food in the Last Year: Never true  Transportation Needs: No Transportation Needs (02/27/2024)   PRAPARE - Administrator, Civil Service (Medical): No    Lack of Transportation (Non-Medical): No  Physical Activity: Insufficiently Active (02/27/2024)   Exercise Vital Sign    Days of Exercise per Week: 4 days    Minutes of Exercise per Session: 30 min  Stress: Stress Concern Present (02/27/2024)   [employer] of Occupational Health - Occupational Stress Questionnaire    Feeling of Stress: Rather much  Social Connections: Moderately Integrated (02/27/2024)   Social Connection and Isolation Panel    Frequency of Communication with Friends and Family: More than three times a week    Frequency of Social Gatherings with Friends and Family: Once a week    Attends Religious Services: More than 4 times per year    Active Member of [employer] or Organizations: No    Attends Banker Meetings: Not on file    Marital Status: Living with partner  Intimate Partner Violence: Unknown (07/23/2021)   Received from [facility]   HITS    Physically Hurt: Not on file    Insult or Talk Down To: Not on file    Threaten Physical Harm: Not on file    Scream or Curse: Not on file     Review of Systems  All other systems reviewed and are negative.      Objective:   Physical Exam Vitals reviewed.  Cardiovascular:      Rate and Rhythm: Normal rate and regular rhythm.     Heart sounds: Normal heart sounds.  Pulmonary:     Effort: Pulmonary effort is normal. No respiratory distress.     Breath sounds: Normal breath sounds. No wheezing or rales.  Abdominal:     General: Bowel sounds are normal.     Palpations: Abdomen is soft.  Psychiatric:        Behavior: Behavior normal.        Thought Content: Thought content normal.  Judgment: Judgment normal.           Assessment & Plan:  Epigastric pain I believe the patient is dealing with gastritis.  Try Protonix 40 mg daily and reassess in 2 weeks.  If pain is improving, complete 2 months of therapy.  I feel that her depression is worse than she indicates.  I asked her to resume the Trintellix  5 mg a day.  I would recommend adding Rexulti 0.5 mg daily to the Trintellix  if the Trintellix  is not helpful after 1 month.  We would gradually uptitrate to 1 mg a day and see if this would help with treatment resistant depression.

## 2024-04-17 ENCOUNTER — Encounter: Payer: Self-pay | Admitting: Family Medicine

## 2024-04-18 ENCOUNTER — Other Ambulatory Visit: Payer: Self-pay

## 2024-04-18 ENCOUNTER — Telehealth: Payer: Self-pay

## 2024-04-18 MED ORDER — LISDEXAMFETAMINE DIMESYLATE 30 MG PO CAPS: 30.0000 mg | ORAL_CAPSULE | Freq: Every day | ORAL | 0 refills | Status: AC

## 2024-04-18 NOTE — Telephone Encounter (Signed)
 Prescription Request  04/18/2024  LOV: 02/29/24  What is the name of the medication or equipment? clonazePAM  (KLONOPIN ) 0.5 MG tablet [[id number]]   Have you contacted your pharmacy to request a refill? Yes   Which pharmacy would you like this sent to?  [id number] [address], [place] - [address] [place] [address] Phone: [phone] Fax: [phone]    Patient notified that their request is being sent to the clinical staff for review and that they should receive a response within 2 business days.   Please advise at [facility] [phone]

## 2024-04-18 NOTE — Telephone Encounter (Signed)
 Med sent to PCP to approve.

## 2024-04-19 MED ORDER — CLONAZEPAM 0.5 MG PO TABS: 0.5000 mg | ORAL_TABLET | Freq: Two times a day (BID) | ORAL | 1 refills | Status: AC | PRN
